# Patient Record
Sex: Male | Born: 1950
Health system: Southern US, Community
[De-identification: ages and names within clinical notes are randomized; demographics above are authoritative.]

## PROBLEM LIST (undated history)

## (undated) DIAGNOSIS — R0602 Shortness of breath: Secondary | ICD-10-CM

## (undated) DIAGNOSIS — Z9889 Other specified postprocedural states: Secondary | ICD-10-CM

## (undated) DIAGNOSIS — J439 Emphysema, unspecified: Secondary | ICD-10-CM

## (undated) DIAGNOSIS — I10 Essential (primary) hypertension: Secondary | ICD-10-CM

## (undated) DIAGNOSIS — Z87442 Personal history of urinary calculi: Secondary | ICD-10-CM

## (undated) DIAGNOSIS — G709 Myoneural disorder, unspecified: Secondary | ICD-10-CM

## (undated) DIAGNOSIS — Q211 Atrial septal defect, unspecified: Secondary | ICD-10-CM

## (undated) DIAGNOSIS — G4700 Insomnia, unspecified: Secondary | ICD-10-CM

## (undated) DIAGNOSIS — R112 Nausea with vomiting, unspecified: Secondary | ICD-10-CM

## (undated) DIAGNOSIS — N429 Disorder of prostate, unspecified: Secondary | ICD-10-CM

## (undated) DIAGNOSIS — R51 Headache: Secondary | ICD-10-CM

## (undated) DIAGNOSIS — Q2112 Patent foramen ovale: Secondary | ICD-10-CM

## (undated) DIAGNOSIS — K219 Gastro-esophageal reflux disease without esophagitis: Secondary | ICD-10-CM

## (undated) HISTORY — PX: INNER EAR SURGERY: SHX679

## (undated) HISTORY — DX: Patent foramen ovale: Q21.12

## (undated) HISTORY — DX: Emphysema, unspecified: J43.9

## (undated) HISTORY — PX: KNEE SURGERY: SHX244

## (undated) HISTORY — PX: WRIST SURGERY: SHX841

## (undated) HISTORY — PX: ELBOW SURGERY: SHX618

## (undated) HISTORY — DX: Atrial septal defect, unspecified: Q21.10

## (undated) HISTORY — PX: ROTATOR CUFF REPAIR: SHX139

## (undated) HISTORY — PX: CYSTECTOMY: SUR359

## (undated) HISTORY — PX: MASTOIDECTOMY: SHX711

## (undated) HISTORY — DX: Insomnia, unspecified: G47.00

## (undated) HISTORY — PX: EYE SURGERY: SHX253

## (undated) HISTORY — DX: Disorder of prostate, unspecified: N42.9

## (undated) HISTORY — DX: Essential (primary) hypertension: I10

## (undated) HISTORY — PX: NECK SURGERY: SHX720

---

## 1997-10-13 ENCOUNTER — Ambulatory Visit (HOSPITAL_COMMUNITY): Admission: RE | Admit: 1997-10-13 | Discharge: 1997-10-13 | Payer: Self-pay | Admitting: Family Medicine

## 1998-06-11 ENCOUNTER — Ambulatory Visit (HOSPITAL_BASED_OUTPATIENT_CLINIC_OR_DEPARTMENT_OTHER): Admission: RE | Admit: 1998-06-11 | Discharge: 1998-06-11 | Payer: Self-pay | Admitting: Orthopaedic Surgery

## 2000-03-09 ENCOUNTER — Encounter: Payer: Self-pay | Admitting: Neurological Surgery

## 2000-03-09 ENCOUNTER — Encounter: Admission: RE | Admit: 2000-03-09 | Discharge: 2000-03-09 | Payer: Self-pay | Admitting: Neurological Surgery

## 2000-03-17 ENCOUNTER — Encounter: Admission: RE | Admit: 2000-03-17 | Discharge: 2000-03-17 | Payer: Self-pay | Admitting: Neurological Surgery

## 2000-03-17 ENCOUNTER — Encounter: Payer: Self-pay | Admitting: Neurological Surgery

## 2000-08-01 ENCOUNTER — Encounter: Payer: Self-pay | Admitting: Neurosurgery

## 2000-08-01 ENCOUNTER — Ambulatory Visit (HOSPITAL_COMMUNITY): Admission: RE | Admit: 2000-08-01 | Discharge: 2000-08-01 | Payer: Self-pay | Admitting: Neurosurgery

## 2001-01-29 ENCOUNTER — Ambulatory Visit (HOSPITAL_COMMUNITY): Admission: RE | Admit: 2001-01-29 | Discharge: 2001-01-29 | Payer: Self-pay | Admitting: General Surgery

## 2001-01-29 ENCOUNTER — Other Ambulatory Visit: Admission: RE | Admit: 2001-01-29 | Discharge: 2001-01-29 | Payer: Self-pay | Admitting: General Surgery

## 2001-01-29 ENCOUNTER — Encounter: Payer: Self-pay | Admitting: General Surgery

## 2001-12-12 ENCOUNTER — Encounter: Admission: RE | Admit: 2001-12-12 | Discharge: 2001-12-12 | Payer: Self-pay | Admitting: *Deleted

## 2001-12-12 ENCOUNTER — Encounter: Payer: Self-pay | Admitting: *Deleted

## 2001-12-13 ENCOUNTER — Ambulatory Visit (HOSPITAL_BASED_OUTPATIENT_CLINIC_OR_DEPARTMENT_OTHER): Admission: RE | Admit: 2001-12-13 | Discharge: 2001-12-13 | Payer: Self-pay | Admitting: *Deleted

## 2003-11-21 ENCOUNTER — Inpatient Hospital Stay (HOSPITAL_COMMUNITY): Admission: RE | Admit: 2003-11-21 | Discharge: 2003-11-22 | Payer: Self-pay | Admitting: Orthopaedic Surgery

## 2004-03-05 ENCOUNTER — Ambulatory Visit (HOSPITAL_COMMUNITY): Admission: RE | Admit: 2004-03-05 | Discharge: 2004-03-05 | Payer: Self-pay | Admitting: Family Medicine

## 2005-12-22 ENCOUNTER — Ambulatory Visit (HOSPITAL_COMMUNITY): Admission: RE | Admit: 2005-12-22 | Discharge: 2005-12-22 | Payer: Self-pay | Admitting: *Deleted

## 2006-06-15 ENCOUNTER — Ambulatory Visit (HOSPITAL_COMMUNITY): Admission: RE | Admit: 2006-06-15 | Discharge: 2006-06-15 | Payer: Self-pay | Admitting: Family Medicine

## 2006-06-16 ENCOUNTER — Ambulatory Visit (HOSPITAL_COMMUNITY): Admission: RE | Admit: 2006-06-16 | Discharge: 2006-06-16 | Payer: Self-pay | Admitting: Family Medicine

## 2006-07-18 ENCOUNTER — Ambulatory Visit (HOSPITAL_COMMUNITY): Admission: RE | Admit: 2006-07-18 | Discharge: 2006-07-18 | Payer: Self-pay | Admitting: General Surgery

## 2006-07-19 ENCOUNTER — Emergency Department (HOSPITAL_COMMUNITY): Admission: EM | Admit: 2006-07-19 | Discharge: 2006-07-19 | Payer: Self-pay | Admitting: Emergency Medicine

## 2006-11-01 ENCOUNTER — Ambulatory Visit (HOSPITAL_COMMUNITY): Admission: RE | Admit: 2006-11-01 | Discharge: 2006-11-01 | Payer: Self-pay | Admitting: Family Medicine

## 2007-03-14 ENCOUNTER — Ambulatory Visit (HOSPITAL_COMMUNITY): Admission: RE | Admit: 2007-03-14 | Discharge: 2007-03-14 | Payer: Self-pay | Admitting: Family Medicine

## 2007-04-10 ENCOUNTER — Inpatient Hospital Stay (HOSPITAL_COMMUNITY): Admission: RE | Admit: 2007-04-10 | Discharge: 2007-04-11 | Payer: Self-pay | Admitting: Orthopaedic Surgery

## 2008-03-28 ENCOUNTER — Ambulatory Visit (HOSPITAL_COMMUNITY): Admission: RE | Admit: 2008-03-28 | Discharge: 2008-03-28 | Payer: Self-pay | Admitting: Internal Medicine

## 2008-12-08 ENCOUNTER — Ambulatory Visit (HOSPITAL_COMMUNITY): Admission: RE | Admit: 2008-12-08 | Discharge: 2008-12-08 | Payer: Self-pay | Admitting: Family Medicine

## 2009-06-06 HISTORY — PX: ESOPHAGOGASTRODUODENOSCOPY: SHX1529

## 2009-12-11 ENCOUNTER — Ambulatory Visit (HOSPITAL_COMMUNITY): Admission: RE | Admit: 2009-12-11 | Discharge: 2009-12-11 | Payer: Self-pay | Admitting: Family Medicine

## 2010-01-05 ENCOUNTER — Ambulatory Visit: Payer: Self-pay | Admitting: Internal Medicine

## 2010-01-05 DIAGNOSIS — R1319 Other dysphagia: Secondary | ICD-10-CM | POA: Insufficient documentation

## 2010-01-05 DIAGNOSIS — K219 Gastro-esophageal reflux disease without esophagitis: Secondary | ICD-10-CM | POA: Insufficient documentation

## 2010-01-06 ENCOUNTER — Encounter: Payer: Self-pay | Admitting: Internal Medicine

## 2010-01-19 ENCOUNTER — Ambulatory Visit: Payer: Self-pay | Admitting: Internal Medicine

## 2010-01-19 ENCOUNTER — Ambulatory Visit (HOSPITAL_COMMUNITY): Admission: RE | Admit: 2010-01-19 | Discharge: 2010-01-19 | Payer: Self-pay | Admitting: Internal Medicine

## 2010-07-08 ENCOUNTER — Encounter (INDEPENDENT_AMBULATORY_CARE_PROVIDER_SITE_OTHER): Payer: Self-pay | Admitting: *Deleted

## 2010-07-08 NOTE — Assessment & Plan Note (Signed)
Summary: gerd,acid reflux,dysphagia/ss   Visit Type:  Initial Consult Referring Provider:  Phillips Odor Primary Care Provider:  Phillips Odor  Chief Complaint:  Reflux/dysphagia.  History of Present Illness: Pleasant 60 year old gentleman referred by Dr. Phillips Odor and associates for further evaluation of long-standing intermittent chest and neck pain which comes on spontaneously not associate with eating. He does have a history of typical reflux symptoms which have been recently abolished with omeprazole 40 mg orally twice daily. In addition, he notes intermittent esophageal dysphagia to bread and meats. He has had a thorough cardiac evaluation -  has been told by his cardiologist that his heart is doing very well is not source of his symptoms. Of note, he has taken a variety of over-the-counter acid suppressing agents on a p.r.n. basis until 3 weeks ago when .  He was started on omeprazole 40 mg orally twice daily; shortly thereafter episodes of chest and jaw pain has subsided; he continues to have intermittent dysphagia. He has never had his upper GI tract evaluated. He tells me Dr. Lovell Sheehan  performed screening colonoscopy a few years ago and everything checked out okay.  No melena or rectal bleeding. and   Current Medications (verified): 1)  Proventil Hfa 108 (90 Base) Mcg/act Aers (Albuterol Sulfate) .... As Directed 2)  Gabapentin 300 Mg Caps (Gabapentin) .... Take Two Tablets Three Times Daily 3)  Wellbutrin Xl 300 Mg Xr24h-Tab (Bupropion Hcl) .... Take 1 Tablet By Mouth Once A Day 4)  Omeprazole 40 Mg Cpdr (Omeprazole) .... Take 1 Tablet By Mouth Two Times A Day 5)  Metoprolol Tartrate 100 Mg Tabs (Metoprolol Tartrate) .... Take 1 Tablet By Mouth Two Times A Day 6)  Ambien 10 Mg Tabs (Zolpidem Tartrate) .... Take 1 Tab By Mouth At Bedtime 7)  Amitriptyline Hcl 100 Mg Tabs (Amitriptyline Hcl) .... One Tablet At Bedtime 8)  Flomax 0.4 Mg Caps (Tamsulosin Hcl) .... Two Tablets At Bedtime 9)   Hydrocodone-Acetaminophen 7.5-650 Mg Tabs (Hydrocodone-Acetaminophen) .... As Needed 10)  Nitro-Dur 0.4 Mg/hr Pt24 (Nitroglycerin) .... As Needed  Allergies (verified): No Known Drug Allergies  Past History:  Family History: Last updated: Jan 22, 2010 Father: Deceased age 33   Aneurysm in brain Mother: Living age 22   healthy/dementia Siblings: Two sisters    healthy  Social History: Last updated: 01/22/2010 Marital Status: Married Children: two     healthy Occupation: Retired  Charity fundraiser  Past Medical History: Chronic Asthma Mild Emphysema Anxiety Depression Insomnia Prostate problems Hypertension Neuropathy  Past Surgical History: Three neck surgeries Two surgeries on each elbow Tow surgeries on each knee Left wrist surgery Two surgeries on left ear Mastoidectomy (left)  Family History: Father: Deceased age 69   Aneurysm in brain Mother: Living age 74   healthy/dementia Siblings: Two sisters    healthy  Social History: Marital Status: Married Children: two     healthy Occupation: Retired  Charity fundraiser  Physical Exam  General:  alert conversant pleasant gentleman resting comfortably Eyes:  no scleral icterus. Lungs:  clear to auscultation Heart:  regular rhythm without murmur gallop rub Abdomen:  nondistended positive bowel sounds soft nontender without appreciable mass or organomegaly  Impression & Recommendations: Impression: Atypical chest and neck pain with some associated typical reflux symptoms and  esophageal dysphagia. It's good to know that his cardiac exam came back negative. Since being on high-dose acid suppressing therapy, his chest and neck symptoms have subsided. His typical GERD symptoms have also subsided. I suspect that we are essentially dealing  with an atypical manifestation of gastroesophageal reflux disease. He has esophageal dysphagia to solids which warrants further evaluation.  Recommendations: Diagnostic  EGD with potential for esophageal dilation in the very near future at the hospital. Risks, benefits, limitations, imponderables and alternatives have been reviewed. Questions have been answered. He is agreeable. We'll make further recommendations once endoscopic evaluation has been carried out.  I thank Dr. Phillips Odor for his referral.  Appended Document: Orders Update    Clinical Lists Changes  Problems: Added new problem of GERD (ICD-530.81) Added new problem of OTHER DYSPHAGIA (ICD-787.29) Orders: Added new Service order of New Patient Level IV (16109) - Signed

## 2010-07-08 NOTE — Letter (Signed)
Summary: REFERRAL FROM DR Assunta Found  REFERRAL FROM DR Assunta Found   Imported By: Rexene Alberts 01/06/2010 12:04:04  _____________________________________________________________________  External Attachment:    Type:   Image     Comment:   External Document

## 2010-07-08 NOTE — Letter (Signed)
Summary: EGD/ED order  EGD/ED order   Imported By: Minna Merritts 01/05/2010 16:13:49  _____________________________________________________________________  External Attachment:    Type:   Image     Comment:   External Document

## 2010-07-14 NOTE — Letter (Signed)
Summary: Recall Office Visit  [facility]  [address]   [place], [place] [address]   Phone: [phone]  Fax: [phone]      July 08, 2010   Terry Morrow [address] [place], [place]  [address] 03/12/1951   Dear [name],   According to our records, it is time for you to schedule a follow-up office visit with [place].   At your convenience, please call [phone] to schedule an office visit. If you have any questions, concerns, or feel that this letter is in error, we would appreciate your call.   Sincerely,    Diana Eves  [facility] Ph: [phone]   Fax: [phone]

## 2010-07-21 ENCOUNTER — Ambulatory Visit (HOSPITAL_COMMUNITY)
Admission: RE | Admit: 2010-07-21 | Discharge: 2010-07-21 | Disposition: A | Discharge status: Home / Self Care | Payer: Medicare Other | Source: Ambulatory Visit | Attending: Family Medicine | Admitting: Family Medicine

## 2010-07-21 ENCOUNTER — Other Ambulatory Visit (HOSPITAL_COMMUNITY): Payer: Self-pay | Admitting: Family Medicine

## 2010-07-21 ENCOUNTER — Encounter (HOSPITAL_COMMUNITY): Payer: Self-pay

## 2010-07-21 DIAGNOSIS — R0602 Shortness of breath: Secondary | ICD-10-CM

## 2010-07-21 DIAGNOSIS — R059 Cough, unspecified: Secondary | ICD-10-CM

## 2010-07-21 DIAGNOSIS — J449 Chronic obstructive pulmonary disease, unspecified: Secondary | ICD-10-CM | POA: Insufficient documentation

## 2010-07-21 DIAGNOSIS — R05 Cough: Secondary | ICD-10-CM

## 2010-07-21 DIAGNOSIS — J4489 Other specified chronic obstructive pulmonary disease: Secondary | ICD-10-CM | POA: Insufficient documentation

## 2010-07-21 DIAGNOSIS — F17200 Nicotine dependence, unspecified, uncomplicated: Secondary | ICD-10-CM

## 2010-08-12 ENCOUNTER — Ambulatory Visit (INDEPENDENT_AMBULATORY_CARE_PROVIDER_SITE_OTHER): Payer: Medicare Other | Admitting: Gastroenterology

## 2010-08-12 ENCOUNTER — Encounter: Payer: Self-pay | Admitting: Gastroenterology

## 2010-08-12 DIAGNOSIS — K219 Gastro-esophageal reflux disease without esophagitis: Secondary | ICD-10-CM

## 2010-08-12 DIAGNOSIS — R1319 Other dysphagia: Secondary | ICD-10-CM

## 2010-08-17 NOTE — Assessment & Plan Note (Signed)
Summary: PP FU IN 6 MONTHS/GERD,ACID REFLUX,DYSPHAGIA/law   Vital Signs:  Patient profile:   60 year old male Height:      71 inches Weight:      174 pounds BMI:     24.36 Temp:     98.7 degrees F oral Pulse rate:   84 / minute BP sitting:   126 / 78  (left arm)  Vitals Entered By: Carolan Clines LPN (August 11, 2128 1:37 PM)  Visit Type:  Follow-up Visit Primary Care Provider:  Phillips Odor   History of Present Illness: Here for f/u of GERD, dysphagia. EGD August 2011 with nl esophagus, s/p dilation. Doing well without dysphagia/odynophagia. Reflux controlled on twice daily Omeprazole. No loss of appetite. No wt change. No abdominal pain. Doing well.   Current Medications (verified): 1)  Proventil Hfa 108 (90 Base) Mcg/act Aers (Albuterol Sulfate) .... As Directed 2)  Gabapentin 300 Mg Caps (Gabapentin) .... Take Two Tablets Three Times Daily 3)  Wellbutrin Xl 300 Mg Xr24h-Tab (Bupropion Hcl) .... Take 1 Tablet By Mouth Once A Day 4)  Omeprazole 40 Mg Cpdr (Omeprazole) .... Take 1 Tablet By Mouth Two Times A Day 5)  Metoprolol Tartrate 100 Mg Tabs (Metoprolol Tartrate) .... Take 1 Tablet By Mouth Two Times A Day 6)  Ambien 10 Mg Tabs (Zolpidem Tartrate) .... Take 1 Tab By Mouth At Bedtime 7)  Amitriptyline Hcl 100 Mg Tabs (Amitriptyline Hcl) .... One Tablet At Bedtime 8)  Flomax 0.4 Mg Caps (Tamsulosin Hcl) .... Two Tablets At Bedtime 9)  Hydrocodone-Acetaminophen 7.5-650 Mg Tabs (Hydrocodone-Acetaminophen) .... As Needed 10)  Nitro-Dur 0.4 Mg/hr Pt24 (Nitroglycerin) .... As Needed 11)  Albuterol Sulfate (2.5 Mg/61ml) 0.083% Nebu (Albuterol Sulfate) .... Use One Vial Q4-6hrs Prn 12)  Ibuprofen 200 Mg Tabs (Ibuprofen) .... Take Three Tabs As Needed  Allergies (verified): No Known Drug Allergies  Past History:  Past Medical History: Last updated: 01/05/2010 Chronic Asthma Mild Emphysema Anxiety Depression Insomnia Prostate problems Hypertension Neuropathy  Review of  Systems General:  Denies fever, chills, and anorexia. Eyes:  Denies blurring, irritation, and discharge. ENT:  Denies sore throat, hoarseness, and difficulty swallowing. CV:  Denies chest pains and syncope. Resp:  Denies dyspnea at rest and wheezing. GI:  See HPI. GU:  Denies urinary burning and urinary frequency. MS:  Denies joint pain / LOM, joint swelling, and joint stiffness. Derm:  Denies rash, itching, and dry skin. Neuro:  Denies weakness and syncope. Psych:  Denies depression and anxiety. Endo:  Denies cold intolerance and heat intolerance.  Physical Exam  General:  Well developed, well nourished, no acute distress. Head:  Normocephalic and atraumatic. Lungs:  Clear throughout to auscultation. Heart:  Regular rate and rhythm; no murmurs, rubs,  or bruits. Abdomen:  +BS, soft, non-tender, non-distended. no HSM, no rebound or guarding.  Skin:  Intact without significant lesions or rashes. Psych:  Alert and cooperative. Normal mood and affect.   Impression & Recommendations:  Problem # 1:  GERD (ICD-21.33)  60 year old male with hx of GERD, responded well to twice daily Prilosec 40 mg. Recent EGD Aug 11 reassurring. No dysphagia symptoms currently, s/p dilatation.    Continue Prilosec 40 mg by mouth twice/day Return in 1 year or sooner as needed  Orders: Est. Patient Level II (86578)  Problem # 2:  OTHER DYSPHAGIA (ICD-787.29)  Improved. See # 1  Orders: Est. Patient Level II (46962) Prescriptions: OMEPRAZOLE 40 MG CPDR (OMEPRAZOLE) Take 1 tablet by mouth two times a day  #  61 x 11   Entered and Authorized by:   Gerrit Halls NP   Signed by:   Gerrit Halls NP on 08/12/2010   Method used:   Faxed to ...       [facility]  [address] [phone]* (retail)       [address]       [place], [place]  [address]       Ph: [phone]       Fax: [phone]   RxID:   [id number]    Orders Added: 1)  Est. Patient Level II [27253]  Appended Document: PP FU  IN 6 MONTHS/GERD,ACID REFLUX,DYSPHAGIA/law 89YR F/U OPV IS IN THE COMPUTER

## 2010-08-20 ENCOUNTER — Ambulatory Visit (HOSPITAL_COMMUNITY)
Admission: RE | Admit: 2010-08-20 | Discharge: 2010-08-20 | Disposition: A | Discharge status: Home / Self Care | Payer: Medicare Other | Source: Ambulatory Visit | Attending: Family Medicine | Admitting: Family Medicine

## 2010-08-20 ENCOUNTER — Ambulatory Visit (HOSPITAL_COMMUNITY): Payer: Medicare Other

## 2010-08-20 ENCOUNTER — Other Ambulatory Visit (HOSPITAL_COMMUNITY): Payer: Self-pay | Admitting: Family Medicine

## 2010-08-20 DIAGNOSIS — M25569 Pain in unspecified knee: Secondary | ICD-10-CM | POA: Insufficient documentation

## 2010-08-20 DIAGNOSIS — W11XXXA Fall on and from ladder, initial encounter: Secondary | ICD-10-CM

## 2010-08-20 DIAGNOSIS — M25469 Effusion, unspecified knee: Secondary | ICD-10-CM | POA: Insufficient documentation

## 2010-08-20 DIAGNOSIS — M79604 Pain in right leg: Secondary | ICD-10-CM

## 2010-10-13 ENCOUNTER — Other Ambulatory Visit (HOSPITAL_COMMUNITY): Payer: Self-pay | Admitting: Family Medicine

## 2010-10-13 DIAGNOSIS — R51 Headache: Secondary | ICD-10-CM

## 2010-10-14 ENCOUNTER — Ambulatory Visit (HOSPITAL_COMMUNITY)
Admission: RE | Admit: 2010-10-14 | Discharge: 2010-10-14 | Disposition: A | Discharge status: Home / Self Care | Payer: Medicare Other | Source: Ambulatory Visit | Attending: Family Medicine | Admitting: Family Medicine

## 2010-10-14 DIAGNOSIS — R51 Headache: Secondary | ICD-10-CM | POA: Insufficient documentation

## 2010-10-19 NOTE — Op Note (Signed)
NAME:  Terry Morrow, Terry Morrow               ACCOUNT NO.:  [id number]   MEDICAL RECORD NO.:  [id number]          PATIENT TYPE:  OIB   LOCATION:  5032                         FACILITY:  [facility]   PHYSICIAN:  Mark C. Ophelia Charter, M.D.    DATE OF BIRTH:  06/20/1950   DATE OF PROCEDURE:  04/09/2007  DATE OF DISCHARGE:                               OPERATIVE REPORT   PREOPERATIVE DIAGNOSIS:  C4-5 pseudoarthrosis.   POSTOPERATIVE DIAGNOSIS:  C4-5 pseudoarthrosis.   PROCEDURE:  C4-C5 posterior cervical fusion with wiring.  Iliac crest  aspirate and Vitoss.   SURGEON:  Annell Greening, MD   ASSISTANT:  Maud Deed, PA-C.   ANESTHESIA:  General orotracheal.   ESTIMATED BLOOD LOSS:  200 mL.   PROCEDURE:  Induction of general anesthesia.  The patient placed on a  horseshoe head holder in the prone position.  Careful padding and  positioning, arms tucked.  Tape was applied  to the skin and care was  taken to make sure there is no pressure on the eyes.  Neck was prepped  with DuraPrep as well as the right posterior iliac crest, the area  squared with towels, sterile skin marker on the neck and Betadine Vi-  Drape application.  Sterile Mayo stand head, thyroid sheet.  Incision  was made at the midline and time-out procedure was confirmed.  Two  Kocher clamps were placed between the two expected levels, covered and  the Kocher clamps were placed at the spinous process of C2-3 and C3-4  and planned levels C4-5.  The C4-5 levels then exposed in subperiosteal  dissection on the lamina.  There was several venous bleeders which were  controlled with Bovie cautery.  At one point there was some bleeding and  wound was packed off  and the right posterior iliac crest was aspirated,  moving the needle back and forward the patient get a total of 10 mL of  blood which was placed on the Vitoss 10 mL strip.  After hemostasis was  obtained, triple strand 24-gauge stainless steel wire was twisted into a  cable, passed  underneath C5, around the top of C4 and tightened down  securely.  Cross-table lateral x-ray showed that the wire was in good  position.  After confirmation, this appropriate level of the lamina was  burred up with power bur.  Vitoss was cut in half and packed on the  lamina of C4-5 on each side and wound was then closed with 0 Vicryl in  the deep fascia, 2-0 Vicryl subcutaneous tissue, subcuticular skin  closure,  postop dressing, soft cervical collar.  Instrument count and  needle count correct.      Mark C. Ophelia Charter, M.D.  Electronically Signed     MCY/MEDQ  D:  04/09/2007  T:  04/10/2007  Job:  [id number]

## 2010-10-22 NOTE — Op Note (Signed)
NAME:  Terry Morrow, Terry Morrow                         ACCOUNT NO.:  [id number]   MEDICAL RECORD NO.:  [id number]                   PATIENT TYPE:  INP   LOCATION:  2873                                 FACILITY:  [facility]   PHYSICIAN:  Mark C. Ophelia Charter, M.D.                 DATE OF BIRTH:  11-27-1950   DATE OF PROCEDURE:  11/21/2003  DATE OF DISCHARGE:                                 OPERATIVE REPORT   PREOPERATIVE DIAGNOSES:  1. Cervical spondylosis, C3-4, C4-5.  2. Previous C5-6 fusion.   POSTOPERATIVE DIAGNOSES:  1. Cervical spondylosis, C3-4, C4-5.  2. Previous C5-6 fusion.   PROCEDURE:  C3-4, C4-5 anterior cervical diskectomy and fusion with plating,  with right iliac crest bone graft.   SURGEON:  Mark C. Ophelia Charter, M.D.   ASSISTANT:  Sandrea Matte, P.A.   ANESTHESIA:  GOT.   ESTIMATED BLOOD LOSS:  100 mL.   DRAINS:  One Hemovac in the neck.   PROCEDURE:  After induction of general anesthesia, orotracheal intubation,  head Holter traction, application of gel bag behind the right iliac crest, a  sand bag behind the neck, the neck and the right iliac crest were prepped  with Duraprep.  The patient had previous bone graft obtained from the left  hip for his previous surgery, so the right iliac crest was selected for  harvest at this procedure.  The area was squared with towels after Duraprep  and preoperative Ancef was given prophylactically.  A half sheet in between,  Betadine Vi-Drape application after skin marker.  A sterile Mayo stand at  the head, thyroid sheet.  Incision was made starting in the midline and  extending to the left, parallel but proximal or cephalad to the previous  incision.  Platysma was divided in line with the fibers, blunt dissection  down the longus colli with carotid sheath and contents lateral.  A spur at  C4-5 was identified, crosstable C-arm that was draped was used to confirm  this.  Diskectomy was performed.  Sequential drilling with the Cloward drill  back to the posterior cortex.  Operating microscope was brought in, the  posterior longitudinal ligament was taken down, spurs were removed, dura was  directly visualized.  All degenerative disk material was removed.  There was  some overhanging of the vertebral body above at the disk space requiring  microdissection and a 1 mm Kerrison in order to remove the overhanging spur.  Once the dura was completely decompressed, a 12 mm plug was harvested from  the right iliac crest, splitting the fascia in line with the fibers,  trimming it based on depth gauge measurement, bullet nose and placement.  After irrigation, the self-retaining retractor was removed to the superior  level, identical procedure was repeated.  At this level there was  significant stenosis from large overhanging spurs.  Both gutters were  carefully curetted with Kerrison initially and then Cloward curettes,  going  up to the 0 size.  After harvesting a second bone graft and trimming it, a  second bone graft was placed and impacted.  After confirmation of good  position of both plugs with fluoroscopy, a 40 mm Synthes gold plate was  selected, holes were drilled, 14 mm screws were placed.  AP and lateral x-  ray were used to confirm the appropriate position.  After repeat irrigation,  a Hemovac was placed starting inside and extending out toward the left side  in line with the skin incision, 3-0 in the platysma, 4-0 Vicryl subcuticular  closure, tincture of Benzoin and Steri-Strips.  Iliac crest closed with 0  undyed Vicryl, 2-0 on the subcutaneous tissue, skin staple closure, postop  dressing, and soft cervical collar.  Instrument count and needle count was  correct.                                               Mark C. Ophelia Charter, M.D.    MCY/MEDQ  D:  11/21/2003  T:  11/24/2003  Job:  [id number]

## 2010-10-22 NOTE — H&P (Signed)
NAME:  Terry Morrow, Terry Morrow               ACCOUNT NO.:  [id number]   MEDICAL RECORD NO.:  [id number]          PATIENT TYPE:  AMB   LOCATION:  DAY                           FACILITY:  [facility]   PHYSICIAN:  Dalia Heading, M.D.  DATE OF BIRTH:  17-Jun-1950   DATE OF ADMISSION:  DATE OF DISCHARGE:  LH                              HISTORY & PHYSICAL   CHIEF COMPLAINT:  Need for screening colonoscopy.   HISTORY OF PRESENT ILLNESS:  The patient is a 60 year old white male who  is referred for endoscopic evaluation.  He needs colonoscopy for  screening purposes.  No abdominal pain, weight loss, nausea, vomiting,  diarrhea, melena, or hematochezia have been noted.  He does have some  constipation.  He has never had a colonoscopy.  There is no family  history of colon carcinoma.   PAST MEDICAL HISTORY:  Unremarkable.   PAST SURGICAL HISTORY:  Multiple extremity surgeries, neck surgery.   CURRENT MEDICATIONS:  Omeprazole, Flomax, amitriptyline, Wellbutrin,  metoprolol, Oxyfenesin, Percocet, baby aspirin, cyclobenzaprine.   ALLERGIES:  No known drug allergies.   REVIEW OF SYSTEMS:  The patient smokes 3/4 pack of cigarettes a day.  Denies any significant alcohol use.   PHYSICAL EXAMINATION:  GENERAL:  The patient is a well developed, well  nourished white male in no acute distress.  LUNGS:  Clear to auscultation with equal breath sounds bilaterally.  HEART: Examination reveals regular rate and rhythm without S3, S4, or  murmurs.  ABDOMEN:  Soft, nontender, nondistended.  No hepatosplenomegaly or  masses noted.  RECTAL:  Examination was deferred to the procedure.   IMPRESSION:  Need for screening colonoscopy.   PLAN:  The patient is scheduled for a colonoscopy on July 18, 2006.  The risks and benefits of the procedure including bleeding and  perforation were fully explained to the patient who gave informed  consent.      Dalia Heading, M.D.  Electronically Signed     MAJ/MEDQ   D:  07/06/2006  T:  07/06/2006  Job:  [id number]   cc:   Patrica Duel, M.D.  Fax: [phone]

## 2010-10-22 NOTE — Op Note (Signed)
[place]  Patient:    Terry Morrow, Terry Morrow Visit Number: [id number] MRN: [id number]          Service Type: DSU Location: [facility] Attending Physician:  Maryanna Shape Dictated by:   Reynolds Bowl, M.D. Admit Date:  12/13/2001 Discharge Date: 12/13/2001                             Operative Report  PREOPERATIVE DIAGNOSES: 1. Left knee tear of medial meniscus with locking. 2. Early osteoarthritis.  POSTOPERATIVE DIAGNOSES: 1. Displacing posterior third medial meniscus through degenerative tear. 2. Grade 2 or 3 chondromalacia medial femoral condyle and tibial plateau. 3. Incidental plica.  ANESTHESIA:  General.  SURGEON:  Reynolds Bowl, M.D.  DESCRIPTION OF PROCEDURE:  The patient was given a general anesthetic by endotracheal tube.  The left lower extremity had a tourniquet placed proximally, and he was prepped from the tips of the toes to the tourniquet area with DuraPrep.  The standard drape was applied.  The leg was exsanguinated with an Esmarch bandage and the proximal pneumatic tourniquet elevated to 350 mmHg.  I established an anterolateral portal for viewing then, under direct vision, anteromedial portal, and then with the fluid pressure set on 75 cc exam of the knee disclosed a field of tiny bits of white material washed out.  The patella had grade 2 changes with villi hanging down passing medially.  There was a moderate plica that did not appear inflamed, but it was prominent.  On the medial joint space at least grade 2 chondromalacia changes of the tibial plateau in the medial femoral condyle, and more posteriorly some of the defect was deeper.  I passed a probe posteriorly and could hook into a tear and displace the posterior third or a little more of the medial meniscus, as seen in the photographs.  This was through a degenerative tear.  The anterior cruciate ligament appeared normal, and the lateral joint space articular  surfaces were almost entirely normal, and the free edges of the lateral meniscus were essentially pristene.  I then used rongeurs and rotary meniscotome to remove this posterior part of the medial meniscus up to stable tissue, formed a transition.  Having done that, noted that any slight rubbing of the articular cartilage caused the superficial layer to want to delaminate.  Having completed this, I spent a little more time looking for loose debris which was removed, and then I did release the incidental plica proximally. Having done that, the instruments were removed.  Then 15 cc of Marcaine with epinephrine were injected in the knee.  The portals were closed with 4-0 nylon.  Each portal was covered with Tegaderm, then 4 x 4s, ABD, Kerlix, and Ace wrap applied.  We then held the leg elevated with the tourniquet down for at least two minutes.  Instructions to the patient will be to use cold pack as tolerated, take all usual medications, do frequent quad sets and ankle pumps, ambulate full weightbearing with straight knee.  Basically be at house rest with the leg elevated.  Call with concerns.  See me in the office Monday morning, 8 a.m. Dictated by:   Reynolds Bowl, M.D. Attending Physician:  Maryanna Shape DD:  12/13/01 TD:  12/16/01 Job: [id number]

## 2011-03-16 LAB — DIFFERENTIAL
Basophils Absolute: 0.1
Basophils Relative: 1
Eosinophils Relative: 3
Monocytes Absolute: 0.8 — ABNORMAL HIGH
Neutro Abs: 4.9

## 2011-03-16 LAB — BASIC METABOLIC PANEL
BUN: 10
CO2: 28
Calcium: 9.5
GFR calc non Af Amer: >60
Glucose, Bld: 84
Sodium: 146 — ABNORMAL HIGH

## 2011-03-16 LAB — CBC
Hemoglobin: 15.5
MCHC: 34.1
Platelets: 355
RDW: 13.9

## 2011-03-16 LAB — URINALYSIS, ROUTINE W REFLEX MICROSCOPIC
Bilirubin Urine: NEGATIVE
Glucose, UA: NEGATIVE
Ketones, ur: NEGATIVE
Protein, ur: NEGATIVE
pH: 6

## 2011-03-16 LAB — APTT: aPTT: 29

## 2011-03-16 LAB — PROTIME-INR: Prothrombin Time: 12.1

## 2011-08-25 ENCOUNTER — Encounter: Payer: Self-pay | Admitting: Internal Medicine

## 2011-11-08 ENCOUNTER — Ambulatory Visit (INDEPENDENT_AMBULATORY_CARE_PROVIDER_SITE_OTHER): Payer: Medicare Other | Admitting: Urology

## 2011-11-08 DIAGNOSIS — N402 Nodular prostate without lower urinary tract symptoms: Secondary | ICD-10-CM

## 2012-01-03 ENCOUNTER — Other Ambulatory Visit: Payer: Self-pay | Admitting: Orthopaedic Surgery

## 2012-01-03 DIAGNOSIS — M542 Cervicalgia: Secondary | ICD-10-CM

## 2012-01-04 ENCOUNTER — Ambulatory Visit
Admission: RE | Admit: 2012-01-04 | Discharge: 2012-01-04 | Disposition: A | Discharge status: Home / Self Care | Payer: Medicare Other | Source: Ambulatory Visit | Attending: Orthopaedic Surgery | Admitting: Orthopaedic Surgery

## 2012-01-04 DIAGNOSIS — M542 Cervicalgia: Secondary | ICD-10-CM

## 2012-01-08 ENCOUNTER — Other Ambulatory Visit: Payer: Medicare Other

## 2012-02-02 ENCOUNTER — Encounter (HOSPITAL_COMMUNITY): Payer: Self-pay | Admitting: Pharmacy Technician

## 2012-02-02 ENCOUNTER — Other Ambulatory Visit: Payer: Self-pay | Admitting: Neurological Surgery

## 2012-02-08 ENCOUNTER — Ambulatory Visit (HOSPITAL_COMMUNITY)
Admission: RE | Admit: 2012-02-08 | Discharge: 2012-02-08 | Disposition: A | Discharge status: Home / Self Care | Payer: Medicare Other | Source: Ambulatory Visit | Attending: Neurological Surgery | Admitting: Neurological Surgery

## 2012-02-08 ENCOUNTER — Encounter (HOSPITAL_COMMUNITY): Payer: Self-pay

## 2012-02-08 ENCOUNTER — Encounter (HOSPITAL_COMMUNITY)
Admission: RE | Admit: 2012-02-08 | Discharge: 2012-02-08 | Disposition: A | Discharge status: Home / Self Care | Payer: Medicare Other | Source: Ambulatory Visit | Attending: Neurological Surgery | Admitting: Neurological Surgery

## 2012-02-08 DIAGNOSIS — Z01810 Encounter for preprocedural cardiovascular examination: Secondary | ICD-10-CM | POA: Insufficient documentation

## 2012-02-08 DIAGNOSIS — Z01812 Encounter for preprocedural laboratory examination: Secondary | ICD-10-CM | POA: Insufficient documentation

## 2012-02-08 DIAGNOSIS — E119 Type 2 diabetes mellitus without complications: Secondary | ICD-10-CM | POA: Insufficient documentation

## 2012-02-08 DIAGNOSIS — Z87891 Personal history of nicotine dependence: Secondary | ICD-10-CM | POA: Insufficient documentation

## 2012-02-08 DIAGNOSIS — Z01818 Encounter for other preprocedural examination: Secondary | ICD-10-CM | POA: Insufficient documentation

## 2012-02-08 DIAGNOSIS — I10 Essential (primary) hypertension: Secondary | ICD-10-CM | POA: Insufficient documentation

## 2012-02-08 HISTORY — DX: Headache: R51

## 2012-02-08 LAB — CBC
MCHC: 34.6 g/dL (ref 30.0–36.0)
Platelets: 304 10*3/uL (ref 150–400)
RDW: 13.1 % (ref 11.5–15.5)

## 2012-02-08 LAB — COMPREHENSIVE METABOLIC PANEL
ALT: 21 U/L (ref 0–53)
Alkaline Phosphatase: 96 U/L (ref 39–117)
CO2: 23 mEq/L (ref 19–32)
GFR calc Af Amer: 72 mL/min — ABNORMAL LOW (ref >90)
GFR calc non Af Amer: 62 mL/min — ABNORMAL LOW (ref >90)
Glucose, Bld: 86 mg/dL (ref 70–99)
Potassium: 3.7 mEq/L (ref 3.5–5.1)
Sodium: 140 mEq/L (ref 135–145)
Total Bilirubin: 0.3 mg/dL (ref 0.3–1.2)

## 2012-02-08 NOTE — Progress Notes (Signed)
1515 Wednesday....i CALLED SURGEON'S OFFICE REQUESTING THAT ORDERS BE SIGNED.  DA

## 2012-02-08 NOTE — Pre-Procedure Instructions (Signed)
[name]   02/08/2012   Your procedure is scheduled on:  Thursday, September 5th  Report to [facility] at 8:00 AM.  Call this number if you have problems the morning of surgery: [phone]   Remember:   Do not eat food or drink any liquids:After Midnight Wednesday.    Take these medicines the morning of surgery with A SIP OF WATER: Nebulizers & Inhalers,              Metoprolol, Prilosec, Lorcet Plus   Do not wear jewelry.  Do not wear lotions, powders, or cologne. You may NOT wear deodorant.             Men may shave face and neck.   Do not bring valuables to the hospital.  Contacts, dentures or bridgework may not be worn into surgery.   Leave suitcase in the car. After surgery it may be brought to your room.  For patients admitted to the hospital, checkout time is 11:00 AM the day of discharge.   Patients discharged the day of surgery will not be allowed to drive home.   Name and phone number of your driver:    Special Instructions: CHG Shower Use Special Wash: 1/2 bottle night before surgery and 1/2 bottle morning of surgery.   Please read over the following fact sheets that you were given: Pain Booklet, Coughing and Deep Breathing, MRSA Information and Surgical Site Infection Prevention

## 2012-02-09 ENCOUNTER — Encounter (HOSPITAL_COMMUNITY)
Admission: RE | Disposition: A | Discharge status: Home / Self Care | Payer: Self-pay | Source: Ambulatory Visit | Attending: Neurological Surgery

## 2012-02-09 ENCOUNTER — Encounter (HOSPITAL_COMMUNITY): Payer: Self-pay | Admitting: Anesthesiology

## 2012-02-09 ENCOUNTER — Inpatient Hospital Stay (HOSPITAL_COMMUNITY): Payer: Medicare Other

## 2012-02-09 ENCOUNTER — Encounter (HOSPITAL_COMMUNITY): Payer: Self-pay | Admitting: *Deleted

## 2012-02-09 ENCOUNTER — Inpatient Hospital Stay (HOSPITAL_COMMUNITY)
Admission: RE | Admit: 2012-02-09 | Discharge: 2012-02-09 | DRG: 491 | Disposition: A | Discharge status: Home / Self Care | Payer: Medicare Other | Source: Ambulatory Visit | Attending: Neurological Surgery | Admitting: Neurological Surgery

## 2012-02-09 ENCOUNTER — Inpatient Hospital Stay (HOSPITAL_COMMUNITY): Payer: Medicare Other | Admitting: Anesthesiology

## 2012-02-09 DIAGNOSIS — Z87891 Personal history of nicotine dependence: Secondary | ICD-10-CM

## 2012-02-09 DIAGNOSIS — F411 Generalized anxiety disorder: Secondary | ICD-10-CM | POA: Diagnosis present

## 2012-02-09 DIAGNOSIS — Z01812 Encounter for preprocedural laboratory examination: Secondary | ICD-10-CM

## 2012-02-09 DIAGNOSIS — Z79899 Other long term (current) drug therapy: Secondary | ICD-10-CM

## 2012-02-09 DIAGNOSIS — F3289 Other specified depressive episodes: Secondary | ICD-10-CM | POA: Diagnosis present

## 2012-02-09 DIAGNOSIS — Z981 Arthrodesis status: Secondary | ICD-10-CM

## 2012-02-09 DIAGNOSIS — K219 Gastro-esophageal reflux disease without esophagitis: Secondary | ICD-10-CM | POA: Diagnosis present

## 2012-02-09 DIAGNOSIS — M5020 Other cervical disc displacement, unspecified cervical region: Secondary | ICD-10-CM

## 2012-02-09 DIAGNOSIS — F329 Major depressive disorder, single episode, unspecified: Secondary | ICD-10-CM | POA: Diagnosis present

## 2012-02-09 DIAGNOSIS — J449 Chronic obstructive pulmonary disease, unspecified: Secondary | ICD-10-CM | POA: Diagnosis present

## 2012-02-09 DIAGNOSIS — I10 Essential (primary) hypertension: Secondary | ICD-10-CM | POA: Diagnosis present

## 2012-02-09 DIAGNOSIS — E119 Type 2 diabetes mellitus without complications: Secondary | ICD-10-CM | POA: Diagnosis present

## 2012-02-09 DIAGNOSIS — J4489 Other specified chronic obstructive pulmonary disease: Secondary | ICD-10-CM | POA: Diagnosis present

## 2012-02-09 DIAGNOSIS — Z01810 Encounter for preprocedural cardiovascular examination: Secondary | ICD-10-CM

## 2012-02-09 DIAGNOSIS — Z01818 Encounter for other preprocedural examination: Secondary | ICD-10-CM

## 2012-02-09 SURGERY — POSTERIOR CERVICAL LAMINECTOMY WITH MET- RX
Anesthesia: General | Laterality: Left | Wound class: Clean

## 2012-02-09 MED ORDER — ACETAMINOPHEN 325 MG PO TABS: 650.0000 mg | ORAL_TABLET | ORAL | Status: DC | PRN

## 2012-02-09 MED ORDER — PHENOL 1.4 % MT LIQD: 1.0000 | OROMUCOSAL | Status: DC | PRN

## 2012-02-09 MED ORDER — LIDOCAINE-EPINEPHRINE 1 %-1:100000 IJ SOLN
INTRAMUSCULAR | Status: DC | PRN
  Administered 2012-02-09: 4 mL

## 2012-02-09 MED ORDER — MORPHINE SULFATE 2 MG/ML IJ SOLN: 1.0000 mg | INTRAMUSCULAR | Status: DC | PRN

## 2012-02-09 MED ORDER — BACLOFEN 10 MG PO TABS
10.0000 mg | ORAL_TABLET | Freq: Every day | ORAL | Status: DC | PRN
  Filled 2012-02-09: qty 1

## 2012-02-09 MED ORDER — MIDAZOLAM HCL 5 MG/5ML IJ SOLN
INTRAMUSCULAR | Status: DC | PRN
  Administered 2012-02-09: 2 mg via INTRAVENOUS

## 2012-02-09 MED ORDER — ROCURONIUM BROMIDE 100 MG/10ML IV SOLN
INTRAVENOUS | Status: DC | PRN
  Administered 2012-02-09: 50 mg via INTRAVENOUS

## 2012-02-09 MED ORDER — LACTATED RINGERS IV SOLN
INTRAVENOUS | Status: DC | PRN
  Administered 2012-02-09 (×3): via INTRAVENOUS

## 2012-02-09 MED ORDER — NEOSTIGMINE METHYLSULFATE 1 MG/ML IJ SOLN
INTRAMUSCULAR | Status: DC | PRN
  Administered 2012-02-09: 4 mg via INTRAVENOUS

## 2012-02-09 MED ORDER — SODIUM CHLORIDE 0.9 % IR SOLN
Status: DC | PRN
  Administered 2012-02-09: 11:00:00

## 2012-02-09 MED ORDER — CEFAZOLIN SODIUM 1-5 GM-% IV SOLN
1.0000 g | Freq: Three times a day (TID) | INTRAVENOUS | Status: DC
  Filled 2012-02-09 (×2): qty 50

## 2012-02-09 MED ORDER — BUPIVACAINE HCL (PF) 0.5 % IJ SOLN
INTRAMUSCULAR | Status: DC | PRN
  Administered 2012-02-09: 4 mL

## 2012-02-09 MED ORDER — HYDROMORPHONE HCL PF 1 MG/ML IJ SOLN
0.2500 mg | INTRAMUSCULAR | Status: DC | PRN
  Administered 2012-02-09 (×4): 0.5 mg via INTRAVENOUS

## 2012-02-09 MED ORDER — PANTOPRAZOLE SODIUM 40 MG PO TBEC: 40.0000 mg | DELAYED_RELEASE_TABLET | Freq: Every day | ORAL | Status: DC

## 2012-02-09 MED ORDER — HYDROMORPHONE HCL PF 1 MG/ML IJ SOLN
INTRAMUSCULAR | Status: AC
  Filled 2012-02-09: qty 1

## 2012-02-09 MED ORDER — NITROGLYCERIN 0.4 MG SL SUBL: 0.4000 mg | SUBLINGUAL_TABLET | SUBLINGUAL | Status: DC | PRN

## 2012-02-09 MED ORDER — DEXAMETHASONE SODIUM PHOSPHATE 10 MG/ML IJ SOLN
INTRAMUSCULAR | Status: DC | PRN
  Administered 2012-02-09: 10 mg via INTRAVENOUS

## 2012-02-09 MED ORDER — SODIUM CHLORIDE 0.9 % IJ SOLN: 3.0000 mL | Freq: Two times a day (BID) | INTRAMUSCULAR | Status: DC

## 2012-02-09 MED ORDER — ALBUMIN HUMAN 5 % IV SOLN
INTRAVENOUS | Status: DC | PRN
  Administered 2012-02-09: 11:00:00 via INTRAVENOUS

## 2012-02-09 MED ORDER — HYDROCODONE-ACETAMINOPHEN 10-325 MG PO TABS: 1.0000 | ORAL_TABLET | Freq: Four times a day (QID) | ORAL | Status: DC | PRN

## 2012-02-09 MED ORDER — EPHEDRINE SULFATE 50 MG/ML IJ SOLN
INTRAMUSCULAR | Status: DC | PRN
  Administered 2012-02-09 (×12): 10 mg via INTRAVENOUS

## 2012-02-09 MED ORDER — KETOROLAC TROMETHAMINE 30 MG/ML IJ SOLN
INTRAMUSCULAR | Status: DC | PRN
  Administered 2012-02-09: 30 mg via INTRAVENOUS

## 2012-02-09 MED ORDER — SODIUM CHLORIDE 0.9 % IJ SOLN: 3.0000 mL | INTRAMUSCULAR | Status: DC | PRN

## 2012-02-09 MED ORDER — DIAZEPAM 5 MG PO TABS: 5.0000 mg | ORAL_TABLET | Freq: Four times a day (QID) | ORAL | Status: AC | PRN

## 2012-02-09 MED ORDER — KETOROLAC TROMETHAMINE 15 MG/ML IJ SOLN
15.0000 mg | Freq: Four times a day (QID) | INTRAMUSCULAR | Status: DC
  Administered 2012-02-09: 15 mg via INTRAVENOUS
  Filled 2012-02-09: qty 1

## 2012-02-09 MED ORDER — CEFAZOLIN SODIUM-DEXTROSE 2-3 GM-% IV SOLR
2.0000 g | INTRAVENOUS | Status: AC
  Administered 2012-02-09: 2 g via INTRAVENOUS

## 2012-02-09 MED ORDER — FENTANYL CITRATE 0.05 MG/ML IJ SOLN
INTRAMUSCULAR | Status: DC | PRN
  Administered 2012-02-09: 50 ug via INTRAVENOUS
  Administered 2012-02-09: 100 ug via INTRAVENOUS

## 2012-02-09 MED ORDER — ALBUTEROL SULFATE (5 MG/ML) 0.5% IN NEBU: 2.5000 mg | INHALATION_SOLUTION | RESPIRATORY_TRACT | Status: DC | PRN

## 2012-02-09 MED ORDER — GABAPENTIN 300 MG PO CAPS
300.0000 mg | ORAL_CAPSULE | Freq: Three times a day (TID) | ORAL | Status: DC
  Administered 2012-02-09: 300 mg via ORAL
  Filled 2012-02-09 (×2): qty 1

## 2012-02-09 MED ORDER — THROMBIN 5000 UNITS EX SOLR
CUTANEOUS | Status: DC | PRN
  Administered 2012-02-09 (×2): 5000 [IU] via TOPICAL

## 2012-02-09 MED ORDER — LIDOCAINE HCL (CARDIAC) 20 MG/ML IV SOLN
INTRAVENOUS | Status: DC | PRN
  Administered 2012-02-09: 60 mg via INTRAVENOUS

## 2012-02-09 MED ORDER — ALUM & MAG HYDROXIDE-SIMETH 200-200-20 MG/5ML PO SUSP: 30.0000 mL | Freq: Four times a day (QID) | ORAL | Status: DC | PRN

## 2012-02-09 MED ORDER — TOPIRAMATE 100 MG PO TABS
100.0000 mg | ORAL_TABLET | Freq: Every day | ORAL | Status: DC
Start: 2012-02-09 — End: 2012-02-09
  Filled 2012-02-09: qty 1

## 2012-02-09 MED ORDER — DIAZEPAM 5 MG PO TABS: 5.0000 mg | ORAL_TABLET | Freq: Four times a day (QID) | ORAL | Status: DC | PRN

## 2012-02-09 MED ORDER — PROPOFOL 10 MG/ML IV EMUL
INTRAVENOUS | Status: DC | PRN
  Administered 2012-02-09: 150 mg via INTRAVENOUS

## 2012-02-09 MED ORDER — MENTHOL 3 MG MT LOZG: 1.0000 | LOZENGE | OROMUCOSAL | Status: DC | PRN

## 2012-02-09 MED ORDER — SODIUM CHLORIDE 0.9 % IV SOLN
INTRAVENOUS | Status: AC
  Filled 2012-02-09: qty 500

## 2012-02-09 MED ORDER — DEXTROSE 5 % IV SOLN
10.0000 mg | INTRAVENOUS | Status: DC | PRN
  Administered 2012-02-09: 25 ug/min via INTRAVENOUS

## 2012-02-09 MED ORDER — CEFAZOLIN SODIUM-DEXTROSE 2-3 GM-% IV SOLR
INTRAVENOUS | Status: AC
  Filled 2012-02-09: qty 50

## 2012-02-09 MED ORDER — 0.9 % SODIUM CHLORIDE (POUR BTL) OPTIME
TOPICAL | Status: DC | PRN
  Administered 2012-02-09: 1000 mL

## 2012-02-09 MED ORDER — METOPROLOL SUCCINATE ER 100 MG PO TB24
100.0000 mg | ORAL_TABLET | Freq: Every day | ORAL | Status: DC
  Filled 2012-02-09: qty 1

## 2012-02-09 MED ORDER — GLYCOPYRROLATE 0.2 MG/ML IJ SOLN
INTRAMUSCULAR | Status: DC | PRN
  Administered 2012-02-09: 0.6 mg via INTRAVENOUS

## 2012-02-09 MED ORDER — ACETAMINOPHEN 650 MG RE SUPP: 650.0000 mg | RECTAL | Status: DC | PRN

## 2012-02-09 MED ORDER — BUPROPION HCL ER (XL) 300 MG PO TB24
300.0000 mg | ORAL_TABLET | Freq: Every day | ORAL | Status: DC
  Filled 2012-02-09: qty 1

## 2012-02-09 MED ORDER — SODIUM CHLORIDE 0.9 % IV SOLN: 250.0000 mL | INTRAVENOUS | Status: DC

## 2012-02-09 MED ORDER — ALBUTEROL SULFATE HFA 108 (90 BASE) MCG/ACT IN AERS
2.0000 | INHALATION_SPRAY | Freq: Four times a day (QID) | RESPIRATORY_TRACT | Status: DC | PRN
  Filled 2012-02-09: qty 6.7

## 2012-02-09 MED ORDER — HEMOSTATIC AGENTS (NO CHARGE) OPTIME
TOPICAL | Status: DC | PRN
  Administered 2012-02-09: 1 via TOPICAL

## 2012-02-09 MED ORDER — TAMSULOSIN HCL 0.4 MG PO CAPS
0.4000 mg | ORAL_CAPSULE | Freq: Every day | ORAL | Status: DC
  Filled 2012-02-09: qty 1

## 2012-02-09 MED ORDER — AMITRIPTYLINE HCL 100 MG PO TABS
100.0000 mg | ORAL_TABLET | Freq: Every day | ORAL | Status: DC
Start: 2012-02-09 — End: 2012-02-09
  Filled 2012-02-09: qty 1

## 2012-02-09 MED ORDER — ONDANSETRON HCL 4 MG/2ML IJ SOLN: 4.0000 mg | INTRAMUSCULAR | Status: DC | PRN

## 2012-02-09 MED ORDER — ONDANSETRON HCL 4 MG/2ML IJ SOLN
INTRAMUSCULAR | Status: DC | PRN
  Administered 2012-02-09: 4 mg via INTRAVENOUS

## 2012-02-09 MED ORDER — BACITRACIN 50000 UNITS IM SOLR
INTRAMUSCULAR | Status: AC
  Filled 2012-02-09: qty 1

## 2012-02-09 SURGICAL SUPPLY — 54 items
ADH SKN CLS APL DERMABOND .7 (GAUZE/BANDAGES/DRESSINGS) ×1
BAG DECANTER FOR FLEXI CONT (MISCELLANEOUS) ×2 IMPLANT
BLADE SURG 15 STRL LF DISP TIS (BLADE) ×1 IMPLANT
BLADE SURG 15 STRL SS (BLADE) ×2
BLADE SURG ROTATE 9660 (MISCELLANEOUS) IMPLANT
CANISTER SUCTION 2500CC (MISCELLANEOUS) ×2 IMPLANT
CLOTH BEACON ORANGE TIMEOUT ST (SAFETY) ×2 IMPLANT
CONT SPEC 4OZ CLIKSEAL STRL BL (MISCELLANEOUS) ×2 IMPLANT
DECANTER SPIKE VIAL GLASS SM (MISCELLANEOUS) ×1 IMPLANT
DERMABOND ADVANCED (GAUZE/BANDAGES/DRESSINGS) ×1
DERMABOND ADVANCED .7 DNX12 (GAUZE/BANDAGES/DRESSINGS) ×2 IMPLANT
DRAPE C-ARM 42X72 X-RAY (DRAPES) IMPLANT
DRAPE LAPAROTOMY 100X72 PEDS (DRAPES) ×2 IMPLANT
DRAPE MICROSCOPE LEICA (MISCELLANEOUS) ×2 IMPLANT
DRAPE POUCH INSTRU U-SHP 10X18 (DRAPES) ×2 IMPLANT
DRAPE PROXIMA HALF (DRAPES) ×1 IMPLANT
DURAPREP 6ML APPLICATOR 50/CS (WOUND CARE) ×2 IMPLANT
ELECT BLADE 6.5 EXT (BLADE) ×2 IMPLANT
ELECT REM PT RETURN 9FT ADLT (ELECTROSURGICAL) ×2
ELECTRODE REM PT RTRN 9FT ADLT (ELECTROSURGICAL) ×1 IMPLANT
GAUZE SPONGE 4X4 16PLY XRAY LF (GAUZE/BANDAGES/DRESSINGS) IMPLANT
GLOVE BIO SURGEON STRL SZ8.5 (GLOVE) ×1 IMPLANT
GLOVE BIOGEL PI IND STRL 8.5 (GLOVE) ×1 IMPLANT
GLOVE BIOGEL PI INDICATOR 8.5 (GLOVE) ×2
GLOVE ECLIPSE 8.5 STRL (GLOVE) ×2 IMPLANT
GLOVE EXAM NITRILE LRG STRL (GLOVE) IMPLANT
GLOVE EXAM NITRILE MD LF STRL (GLOVE) IMPLANT
GLOVE EXAM NITRILE XL STR (GLOVE) IMPLANT
GLOVE EXAM NITRILE XS STR PU (GLOVE) IMPLANT
GLOVE SS BIOGEL STRL SZ 8 (GLOVE) IMPLANT
GLOVE SUPERSENSE BIOGEL SZ 8 (GLOVE) ×1
GLOVE SURG SS PI 8.0 STRL IVOR (GLOVE) ×1 IMPLANT
GOWN BRE IMP SLV AUR LG STRL (GOWN DISPOSABLE) IMPLANT
GOWN BRE IMP SLV AUR XL STRL (GOWN DISPOSABLE) ×1 IMPLANT
GOWN STRL REIN 2XL LVL4 (GOWN DISPOSABLE) ×3 IMPLANT
KIT BASIN OR (CUSTOM PROCEDURE TRAY) ×2 IMPLANT
KIT ROOM TURNOVER OR (KITS) ×2 IMPLANT
NDL HYPO 18GX1.5 BLUNT FILL (NEEDLE) IMPLANT
NDL SPNL 20GX3.5 QUINCKE YW (NEEDLE) IMPLANT
NEEDLE HYPO 18GX1.5 BLUNT FILL (NEEDLE) IMPLANT
NEEDLE HYPO 22GX1.5 SAFETY (NEEDLE) ×2 IMPLANT
NEEDLE SPNL 20GX3.5 QUINCKE YW (NEEDLE) ×2 IMPLANT
NS IRRIG 1000ML POUR BTL (IV SOLUTION) ×2 IMPLANT
PACK LAMINECTOMY NEURO (CUSTOM PROCEDURE TRAY) ×2 IMPLANT
PAD ARMBOARD 7.5X6 YLW CONV (MISCELLANEOUS) ×6 IMPLANT
RUBBERBAND STERILE (MISCELLANEOUS) ×2 IMPLANT
SPONGE SURGIFOAM ABS GEL SZ50 (HEMOSTASIS) ×2 IMPLANT
SUT VIC AB 3-0 SH 8-18 (SUTURE) ×2 IMPLANT
SYR 20ML ECCENTRIC (SYRINGE) ×2 IMPLANT
SYR 5ML LL (SYRINGE) IMPLANT
TOWEL OR 17X24 6PK STRL BLUE (TOWEL DISPOSABLE) ×2 IMPLANT
TOWEL OR 17X26 10 PK STRL BLUE (TOWEL DISPOSABLE) ×2 IMPLANT
WATER STERILE IRR 1000ML POUR (IV SOLUTION) ×2 IMPLANT
WIRE TIP MIS 2.5MM NEURO (BURR) ×1 IMPLANT

## 2012-02-09 NOTE — H&P (Signed)
   Terry Morrow  #[id number]  DOB:  [birth year]       CHIEF COMPLAINT:   Left arm and hand pain and weakness, numbness and tingling.    HISTORY OF PRESENT ILLNESS:  Terry Morrow is a 61 year old, right-handed individual whom I have seen in the past for degenerative disc disease in his cervical spine.  Back in May of 1993 I did an anterior decompression and arthrodesis at C5-6 and at C6-C7.  Meiko tolerated that surgery well and he has done fairly well, except in the last few months he has developed severe pain in that left shoulder and arm.  He notes that he was recovering from a right shoulder rotator cuff surgery and was trying to do some physical therapy when this whole process started.  He notes he has been absolutely miserable.  He has had a steroid injection by Naaman Plummer which didn't help.  He has had a course of oral steroid medication.  He only finds the pain seems to be getting worse.  An MRI of his cervical spine was completed on 01/04/12 and he is seen now for further consultation regarding some advice as to what to do.    [name] notes that he is extremely uncomfortable.  The only way he can get even transient relief is by lying down.  He has been on some strong narcotic pain medications and notes that they don't seem to be attacking the pain adequately.    PAST MEDICAL HISTORY:  Terry Morrow is a generally healthy individual.    PERSONAL HISTORY:   He has a significant history of smoking about a pack of cigarettes a day.  He did quit for a long period of time.  He does not drink alcohol.  Height and weight have been stable at 5', 11" and 172 lbs.    MEDICATIONS:    Current medications include Gabapentin 2 tablets a day, Metoprolol 100 mg daily to regulate his heart rate, Topiramate 100 mg for migraines, Pravastatin a table a day for cholesterol, Baclofen one a day for migraines, Hydrocodone as needed, Zolpidem for sleeping, Amitriptyline for sleep and Tamsulosin for his prostate.    PHYSICAL  EXAMINATION:  His physical exam at this time reveals that he has weakness of the intrinsic muscles of the left hand compared to the right hand.  He has some atrophy in the first dorsal interosseous.  His extensor digiti quinti is weak also.  He has good strength in the bicep, tricep and wrist extensor.  He has marked tenderness to palpation in the supraclavicular fossa on the left side.    IMPRESSION:    The patient has an extruded fragment of disc at C7-T1 on the left side by his MRI that was completed a month ago.  He has been truly miserable with pain and wants something definitive done.  Indicated that probably the best way to approach this would be with a posterior decompression using a METRX technique at C7-T1 on the left side.  We will try to expedite his care at the earliest possible convenience as he is in severe pain.  In the meantime, he will have to continue on the nonsteroidal anti-inflammatory and narcotic pain medication for pain control.  I will provide him a prescription for Hydrocodone 10/325's to see if this helps control his discomfort.

## 2012-02-09 NOTE — Anesthesia Preprocedure Evaluation (Addendum)
Anesthesia Evaluation  Patient identified by MRN, date of birth, ID band  Reviewed: Allergy & Precautions, H&P , NPO status , Patient's Chart, lab work & pertinent test results  Airway Mallampati: II TM Distance: >3 FB Neck ROM: Limited    Dental  (+) Teeth Intact and Dental Advisory Given   Pulmonary asthma , COPD breath sounds clear to auscultation        Cardiovascular hypertension, Pt. on medications Rhythm:Regular Rate:Normal     Neuro/Psych  Headaches, Anxiety Depression    GI/Hepatic Neg liver ROS, GERD-  Medicated,  Endo/Other  negative endocrine ROS  Renal/GU      Musculoskeletal negative musculoskeletal ROS (+)   Abdominal   Peds  Hematology negative hematology ROS (+)   Anesthesia Other Findings   Reproductive/Obstetrics                          Anesthesia Physical Anesthesia Plan  ASA: III  Anesthesia Plan: General   Post-op Pain Management:    Induction: Intravenous  Airway Management Planned: Oral ETT  Additional Equipment: CVP  Intra-op Plan:   Post-operative Plan: Extubation in OR  Informed Consent:   Dental advisory given  Plan Discussed with: Surgeon, Anesthesiologist and CRNA  Anesthesia Plan Comments:       Anesthesia Quick Evaluation

## 2012-02-09 NOTE — Preoperative (Signed)
Beta Blockers   Reason not to administer Beta Blockers:Not Applicable 

## 2012-02-09 NOTE — Progress Notes (Signed)
Patient ID: Terry Morrow, male   DOB: 01/05/1951, 61 y.o.   MRN: [id number] Vital signs are stable postop, patient feels well. Patient notes less pain in left upper extremity. Some dysesthesias persist in fingertips. Incisions clean and dry. Ready for discharge.

## 2012-02-09 NOTE — Anesthesia Postprocedure Evaluation (Signed)
  Anesthesia Post-op Note  Patient: Terry Morrow  Procedure(s) Performed: Procedure(s) (LRB) with comments: POSTERIOR CERVICAL LAMINECTOMY WITH MET- RX (Left) - Cervical seven-thoracic one, left microdiscectomy with Met-RX.  Patient Location: PACU  Anesthesia Type: General  Level of Consciousness: awake  Airway and Oxygen Therapy: Patient Spontanous Breathing  Post-op Pain: mild  Post-op Assessment: Post-op Vital signs reviewed  Post-op Vital Signs: Reviewed  Complications: No apparent anesthesia complications

## 2012-02-09 NOTE — Op Note (Signed)
Date of operation: 02/09/2012 Preoperative diagnosis: Herniated nucleus pulposus C7-T1 left left C8 radiculopathy, status post decompression and fusion C3-C5 anteriorly and posteriorly Post operative diagnosis: Herniated nucleus pulposus C7-T1 left with left C8 radiculopathy, status post decompression and fusion C3-C5 anteriorly and posteriorly Procedure: Microdiscectomy C7-T1 left with Metrix operating microscope microdissection technique, fluoroscopic guidance Surgeon: Barnett Abu M.D. Assistant: Delma Officer M.D. Anesthesia: Gen. endotracheal Indications: Patient is a 61 year old individual is had previous significant cervical difficulties is had decompression arthrodesis anteriorly from C3 down to C5 is had posterior supplemental fusion for pseudoarthrosis. He is encouraged left arm pain and hand weakness has been getting progressively worse for a few months time he was seen in the office a week ago and advised regarding the need for surgery as he had presence of a extruded fragment of disc in the left lateral gutter at C7-T1.   Procedure: Patient was brought to the operating room supine on a stretcher having had central venous monitoring catheter in appropriate other lines placed in the preoperative holding area. After the smooth induction of general endotracheal anesthesia the patient was placed in a 3 pin headrest and then carefully placed into the seated position the back of the neck exposed. Fluoroscopy imaging was brought into the cross table lateral position to view the vertebrae of the cervical spine. Back of the neck was cleansed with alcohol and DuraPrep and draped in a sterile fashion. Localizing radiographs with a small 22-gauge needle identifying the surface anatomy was used to localize the appropriate level. C7-T1 Was identified positively on the radiograph and correlated with fluoroscopy. Skin in this area was infiltrated with 10 cc of 1% lidocaine mixed 50-50 with half percent Marcaine  and 1-100,000 epinephrine. A small vertical incision was created over this area and a K wire was then passed to the interlaminar space of C7-T1. A wanding technique a series of dilators was used to create an opening down to the interlaminar space. Ultimately a 5 cm centimeter by 18 mm diameter cannula was placed in the wound and fixed to the operating table with a clamp. The operating microscope was brought into the field and through the aperture soft tissues above the interlaminar space of C7-T1 were clean. The inferior margin of the lamina of C7 and the superior margin of the lamina and facet complex of C7-T1 were then removed with 2.2 mm high-speed bur. Soft tissues in this area were then cauterized and divided and this identified the path of the C8 nerve root. This was carefully dissected and by working from underneath the nerve root a significant mass of tissue was identified. When this was incised with a #11 blade under moderate pressure some fragments of this extruded themselves. These were removed with a micropituitary rongeur. Further dissection yielded other fragments of disc which were similarly removed. Dissection over the shoulder of the nerve was then performed and this yielded further fragments of disc. This procedure continued from above and below the exiting nerve root until all fragments were removed. The stasis was then carefully obtained with some small pledgets of Gelfoam soaked in thrombin which were then tear gated away. When adequate decompression was completed the area was inspected for hemostasis yet again and then the endoscopic cannula was removed and the fascia and the lung was closed with 3-0 Vicryl in interrupted fashion and the skin was closed with 3-0 Vicryl in an inverted interrupted fashion. Dermabond was placed on the skin blood loss was less than 10 cc.

## 2012-02-09 NOTE — Progress Notes (Signed)
May discontinue right IJ CVC in the pacu per dr. Danielle Dess

## 2012-02-09 NOTE — Anesthesia Postprocedure Evaluation (Signed)
  Anesthesia Post-op Note  Patient: Terry Morrow  Procedure(s) Performed: Procedure(s) (LRB) with comments: POSTERIOR CERVICAL LAMINECTOMY WITH MET- RX (Left) - Cervical seven-thoracic one, left microdiscectomy with Met-RX.  Patient Location: PACU  Anesthesia Type: General  Level of Consciousness: awake  Airway and Oxygen Therapy: Patient Spontanous Breathing  Post-op Pain: mild  Post-op Assessment: Post-op Vital signs reviewed  Post-op Vital Signs: Reviewed  Complications: No apparent anesthesia complications 

## 2012-02-09 NOTE — Discharge Summary (Signed)
Admitting diagnosis: Herniated nucleus pulposus C7-T1 left with left cervical radiculopathy Discharge and final diagnosis: Herniated nucleus pulposus C7-T1 left with left cervical radiculopathy, status post arthrodesis C3-C5 anteriorly and posteriorly.  condition on discharge: Improved Prescriptions written: Valium 5 mg #30 no refills Major operation: Microdiscectomy C7-T1 left Metrix operating system and operating microscope, microdissection technique

## 2012-02-09 NOTE — Progress Notes (Signed)
Pt ambulating very well. Pt's pain is very minimal. Pt and wife given D/C instructions with Rx., both verbalized understanding. Pt D/C'd home via wheelchair @ 1735 per MD order. Rema Fendt, RN

## 2012-02-09 NOTE — Transfer of Care (Signed)
Immediate Anesthesia Transfer of Care Note  Patient: Terry Morrow  Procedure(s) Performed: Procedure(s) (LRB) with comments: POSTERIOR CERVICAL LAMINECTOMY WITH MET- RX (Left) - Cervical seven-thoracic one, left microdiscectomy with Met-RX.  Patient Location: PACU  Anesthesia Type: General  Level of Consciousness: awake, alert  and oriented  Airway & Oxygen Therapy: Patient Spontanous Breathing and Patient connected to face mask oxygen  Post-op Assessment: Report given to PACU RN  Post vital signs: Reviewed and stable  Complications: No apparent anesthesia complications

## 2012-02-09 NOTE — Anesthesia Procedure Notes (Signed)
Procedures RIJ Dual Lumen CVP: 4742-5956: The patient was identified and consent obtained.  TO was performed, and full barrier precautions were used.  The skin was anesthetized with lidocaine.  Once the vein was located with the 22 ga. needle using ultrasound guidance , the wire was inserted into the vein.  The wire location was confirmed with ultrasound.  The tissue was dilated and the catheter was carefully inserted, then sutured in place. A dressing was applied. The patient tolerated the procedure well.  CXR was ordered for PACU. CE

## 2012-08-13 ENCOUNTER — Other Ambulatory Visit (HOSPITAL_COMMUNITY): Payer: Self-pay | Admitting: Physician Assistant

## 2012-08-13 DIAGNOSIS — R51 Headache: Secondary | ICD-10-CM

## 2012-08-15 ENCOUNTER — Ambulatory Visit (HOSPITAL_COMMUNITY): Admission: RE | Admit: 2012-08-15 | Payer: Medicare Other | Source: Ambulatory Visit

## 2012-08-21 ENCOUNTER — Encounter (HOSPITAL_COMMUNITY): Payer: Self-pay

## 2012-08-21 ENCOUNTER — Ambulatory Visit (HOSPITAL_COMMUNITY)
Admission: RE | Admit: 2012-08-21 | Discharge: 2012-08-21 | Disposition: A | Discharge status: Home / Self Care | Payer: Medicare Other | Source: Ambulatory Visit | Attending: Physician Assistant | Admitting: Physician Assistant

## 2012-08-21 DIAGNOSIS — R51 Headache: Secondary | ICD-10-CM | POA: Insufficient documentation

## 2012-11-05 ENCOUNTER — Encounter (HOSPITAL_COMMUNITY): Payer: Self-pay | Admitting: Pharmacy Technician

## 2012-11-05 NOTE — H&P (Signed)
Assessment   History of left mastoidectomy (V45.89) (Z98.89).  History of cholesteatoma (V12.49) (Z86.69).  Conductive hearing loss (389.00) (H90.2).  Smokes tobacco daily (305.1) (F17.200). Discussed  Left mastoidectomy many years ago, with resulting chronic conductive hearing loss. He has been bothered by this for many years. He also has chronic discharge from the ear.    On exam, the left mastoid cavity is clean and healthy. Audiogram from last visit reviewed. Options discussed included traditional hearing aid or BAHA. He has had a lot of drainage from the ear. Given that history, BAHA would be the better choice. He is interested in pursuing this.   Strongly urged him to stop smoking. Reason For Visit  Discuss surgery left ear. Allergies  No Known Drug Allergies. Current Meds  PredniSONE 10 MG Oral Tablet;; RPT Xanax 0.25 MG Oral Tablet (ALPRAZolam);; RPT Albuterol Sulfate (2.5 MG/3ML) 0.083% Inhalation Nebulization Solution;; RPT Amitriptyline HCl - 100 MG Oral Tablet;; RPT Metoprolol Tartrate 100 MG Oral Tablet;; RPT BuPROPion HCl TABS;; RPT Flonase 50 MCG/ACT Nasal Suspension (Fluticasone Propionate);; RPT Hydrocodone-Acetaminophen 7.5-325 MG Oral Tablet;; RPT Gabapentin 300 MG Oral Capsule;; RPT Zanaflex 4 MG Oral Capsule (TiZANidine HCl);; RPT Pravastatin Sodium 40 MG Oral Tablet;; RPT Zolpidem Tartrate 10 MG Oral Tablet;; RPT SUMAtriptan Succinate 50 MG Oral Tablet;; RPT Topiramate 200 MG Oral Tablet;; RPT Tamsulosin HCl - 0.4 MG Oral Capsule;; RPT Levofloxacin 500 MG Oral Tablet;; RPT. Albuterol AERS;; Qty0; R0; RPT. Past Meds  Xanax 0.25 MG Oral Tablet(ALPRAZolam);; Qty0; R0; RPT. Active Problems  Emphysema  (492.8) (J43.9) Eustachian tube dysfunction  (381.81) (H69.80) Hearing loss  (389.9) (H91.90) History of cholesteatoma  (V12.49) (Z86.69) History of left mastoidectomy  (V45.89) (Z98.89) Hyperlipidemia  (272.4) (E78.5) Migraine headache  (346.90)  (G43.909) Mixed conductive and sensorineural hearing loss  (389.20) (H90.8) Reflux  (530.81) (K21.9) Sensorineural hearing loss  (389.10) (H90.5) Tinnitus of left ear  (388.30) (H93.12). PSH  Ear Surgery Kidney Surgery Knee Surgery Neck Surgery Neuroplasty Decompression Median Nerve At Carpal Tunnel Oral Surgery Tooth Extraction Shoulder Surgery Sinus Surgery. Family Hx  Family history of allergic rhinitis (V19.6) (Z84.89) Family history of migraine headaches (V17.2) (Z82.0) No pertinent family history: Mother. Personal Hx  Current every day smoker (305.1) (F17.200) Daily caffeine consumption, 2-3 servings a day Hearing loss (389.9) (H91.90). Signature  Electronically signed by : Serena Colonel  M.D.; 09/11/2012 9:19 PM EST.

## 2012-11-08 ENCOUNTER — Encounter (HOSPITAL_COMMUNITY)
Admission: RE | Admit: 2012-11-08 | Discharge: 2012-11-08 | Disposition: A | Discharge status: Home / Self Care | Payer: Medicare Other | Source: Ambulatory Visit | Attending: Otolaryngology | Admitting: Otolaryngology

## 2012-11-08 ENCOUNTER — Encounter (HOSPITAL_COMMUNITY): Payer: Self-pay

## 2012-11-08 HISTORY — DX: Shortness of breath: R06.02

## 2012-11-08 HISTORY — DX: Gastro-esophageal reflux disease without esophagitis: K21.9

## 2012-11-08 HISTORY — DX: Myoneural disorder, unspecified: G70.9

## 2012-11-08 HISTORY — DX: Nausea with vomiting, unspecified: R11.2

## 2012-11-08 HISTORY — DX: Other specified postprocedural states: Z98.890

## 2012-11-08 LAB — BASIC METABOLIC PANEL
BUN: 15 mg/dL (ref 6–23)
Calcium: 9.4 mg/dL (ref 8.4–10.5)
Creatinine, Ser: 1.1 mg/dL (ref 0.50–1.35)
GFR calc Af Amer: 81 mL/min — ABNORMAL LOW (ref >90)
GFR calc non Af Amer: 70 mL/min — ABNORMAL LOW (ref >90)
Glucose, Bld: 96 mg/dL (ref 70–99)
Potassium: 4.2 mEq/L (ref 3.5–5.1)

## 2012-11-08 LAB — CBC
Hemoglobin: 14.9 g/dL (ref 13.0–17.0)
MCH: 32 pg (ref 26.0–34.0)
MCHC: 35 g/dL (ref 30.0–36.0)
RDW: 13.3 % (ref 11.5–15.5)

## 2012-11-08 LAB — SURGICAL PCR SCREEN: MRSA, PCR: NEGATIVE

## 2012-11-08 NOTE — Pre-Procedure Instructions (Signed)
Terry Morrow  11/08/2012   Your procedure is scheduled on:  11-15-2012  Thursday   Report to [facility] Short Stay Center at 5:30 AM.  If you arrive before 5:30 AM please wait in the main lobby until 5:30 AM as the Short stay Center does not open until then. At 5:30 take the [facility] to the 3rd floor and check in at the [employer].  Call this number if you have problems the morning of surgery: [phone]   Remember:   Do not eat food or drink liquids after midnight.    Take these medicines the morning of surgery with A SIP OF WATER: inhaler  If needed,gabapentin,,pain medication as needed,metoprolol,omeprazole,   Do not wear jewelry,   Do not wear lotions, powders, or perfumes.   Do not shave 48 hours prior to surgery.   Do not bring valuables to the hospital.  [facility] is not responsible   for any belongings or valuables.  Contacts, dentures or bridgework may not be worn into surgery.  Leave suitcase in the car. After surgery it may be brought to your room.   For patients admitted to the hospital, checkout time is 11:00 AM the day of discharge.   Patients discharged the day of surgery will not be allowed to drive home.    Special Instructions: Shower using CHG 2 nights before surgery and the night before surgery.  If you shower the day of surgery use CHG.  Use special wash - you have one bottle of CHG for all showers.  You should use approximately 1/3 of the bottle for each shower.   Please read over the following fact sheets that you were given: Pain Booklet and Surgical Site Infection Prevention

## 2012-11-14 MED ORDER — CEFAZOLIN SODIUM-DEXTROSE 2-3 GM-% IV SOLR
2.0000 g | INTRAVENOUS | Status: AC
  Administered 2012-11-15: 2 g via INTRAVENOUS
  Filled 2012-11-14: qty 50

## 2012-11-15 ENCOUNTER — Encounter (HOSPITAL_COMMUNITY): Payer: Self-pay | Admitting: Anesthesiology

## 2012-11-15 ENCOUNTER — Encounter (HOSPITAL_COMMUNITY)
Admission: RE | Disposition: A | Discharge status: Home / Self Care | Payer: Self-pay | Source: Ambulatory Visit | Attending: Otolaryngology

## 2012-11-15 ENCOUNTER — Encounter (HOSPITAL_COMMUNITY): Payer: Self-pay | Admitting: *Deleted

## 2012-11-15 ENCOUNTER — Ambulatory Visit (HOSPITAL_COMMUNITY)
Admission: RE | Admit: 2012-11-15 | Discharge: 2012-11-15 | Disposition: A | Discharge status: Home / Self Care | Payer: Medicare Other | Source: Ambulatory Visit | Attending: Otolaryngology | Admitting: Otolaryngology

## 2012-11-15 ENCOUNTER — Ambulatory Visit (HOSPITAL_COMMUNITY): Payer: Medicare Other | Admitting: Anesthesiology

## 2012-11-15 DIAGNOSIS — Z01812 Encounter for preprocedural laboratory examination: Secondary | ICD-10-CM | POA: Insufficient documentation

## 2012-11-15 DIAGNOSIS — H908 Mixed conductive and sensorineural hearing loss, unspecified: Secondary | ICD-10-CM | POA: Insufficient documentation

## 2012-11-15 DIAGNOSIS — H9012 Conductive hearing loss, unilateral, left ear, with unrestricted hearing on the contralateral side: Secondary | ICD-10-CM

## 2012-11-15 DIAGNOSIS — J4489 Other specified chronic obstructive pulmonary disease: Secondary | ICD-10-CM | POA: Insufficient documentation

## 2012-11-15 DIAGNOSIS — J449 Chronic obstructive pulmonary disease, unspecified: Secondary | ICD-10-CM | POA: Insufficient documentation

## 2012-11-15 DIAGNOSIS — J438 Other emphysema: Secondary | ICD-10-CM | POA: Insufficient documentation

## 2012-11-15 DIAGNOSIS — F17200 Nicotine dependence, unspecified, uncomplicated: Secondary | ICD-10-CM | POA: Insufficient documentation

## 2012-11-15 DIAGNOSIS — I10 Essential (primary) hypertension: Secondary | ICD-10-CM | POA: Insufficient documentation

## 2012-11-15 HISTORY — PX: BONE ANCHORED HEARING AID IMPLANT: SHX5193

## 2012-11-15 SURGERY — INSERTION, BONE ANCHORED HEARING AID
Anesthesia: General | Site: Ear | Laterality: Left | Wound class: Clean

## 2012-11-15 MED ORDER — FENTANYL CITRATE 0.05 MG/ML IJ SOLN
25.0000 ug | INTRAMUSCULAR | Status: DC | PRN
  Administered 2012-11-15 (×3): 25 ug via INTRAVENOUS

## 2012-11-15 MED ORDER — LIDOCAINE HCL 4 % MT SOLN
OROMUCOSAL | Status: DC | PRN
  Administered 2012-11-15: 4 mL via TOPICAL

## 2012-11-15 MED ORDER — ONDANSETRON HCL 4 MG/2ML IJ SOLN
INTRAMUSCULAR | Status: DC | PRN
  Administered 2012-11-15: 4 mg via INTRAVENOUS

## 2012-11-15 MED ORDER — 0.9 % SODIUM CHLORIDE (POUR BTL) OPTIME
TOPICAL | Status: DC | PRN
  Administered 2012-11-15: 1000 mL

## 2012-11-15 MED ORDER — LIDOCAINE-EPINEPHRINE 1 %-1:100000 IJ SOLN
INTRAMUSCULAR | Status: AC
  Filled 2012-11-15: qty 1

## 2012-11-15 MED ORDER — FENTANYL CITRATE 0.05 MG/ML IJ SOLN
INTRAMUSCULAR | Status: DC | PRN
  Administered 2012-11-15 (×2): 50 ug via INTRAVENOUS

## 2012-11-15 MED ORDER — ARTIFICIAL TEARS OP OINT
TOPICAL_OINTMENT | OPHTHALMIC | Status: DC | PRN
  Administered 2012-11-15: 1 via OPHTHALMIC

## 2012-11-15 MED ORDER — LIDOCAINE HCL (CARDIAC) 20 MG/ML IV SOLN
INTRAVENOUS | Status: DC | PRN
  Administered 2012-11-15: 80 mg via INTRAVENOUS

## 2012-11-15 MED ORDER — EPHEDRINE SULFATE 50 MG/ML IJ SOLN
INTRAMUSCULAR | Status: DC | PRN
  Administered 2012-11-15 (×5): 10 mg via INTRAVENOUS

## 2012-11-15 MED ORDER — LACTATED RINGERS IV SOLN: INTRAVENOUS | Status: DC

## 2012-11-15 MED ORDER — BACITRACIN ZINC 500 UNIT/GM EX OINT
TOPICAL_OINTMENT | CUTANEOUS | Status: DC | PRN
  Administered 2012-11-15: 1 via TOPICAL

## 2012-11-15 MED ORDER — PROMETHAZINE HCL 25 MG RE SUPP: 25.0000 mg | Freq: Four times a day (QID) | RECTAL | Status: DC | PRN

## 2012-11-15 MED ORDER — MIDAZOLAM HCL 5 MG/5ML IJ SOLN
INTRAMUSCULAR | Status: DC | PRN
  Administered 2012-11-15: 1 mg via INTRAVENOUS

## 2012-11-15 MED ORDER — DEXAMETHASONE SODIUM PHOSPHATE 4 MG/ML IJ SOLN
INTRAMUSCULAR | Status: DC | PRN
  Administered 2012-11-15: 4 mg via INTRAVENOUS

## 2012-11-15 MED ORDER — MINERAL OIL LIGHT 100 % EX OIL
TOPICAL_OIL | CUTANEOUS | Status: AC
  Filled 2012-11-15: qty 25

## 2012-11-15 MED ORDER — NEOSTIGMINE METHYLSULFATE 1 MG/ML IJ SOLN
INTRAMUSCULAR | Status: DC | PRN
  Administered 2012-11-15: 3 mg via INTRAVENOUS

## 2012-11-15 MED ORDER — PHENYLEPHRINE HCL 10 MG/ML IJ SOLN
INTRAMUSCULAR | Status: DC | PRN
  Administered 2012-11-15 (×3): 80 ug via INTRAVENOUS
  Administered 2012-11-15: 40 ug via INTRAVENOUS
  Administered 2012-11-15: 80 ug via INTRAVENOUS
  Administered 2012-11-15: 120 ug via INTRAVENOUS
  Administered 2012-11-15 (×4): 80 ug via INTRAVENOUS

## 2012-11-15 MED ORDER — GLYCOPYRROLATE 0.2 MG/ML IJ SOLN
INTRAMUSCULAR | Status: DC | PRN
  Administered 2012-11-15: 0.4 mg via INTRAVENOUS
  Administered 2012-11-15: 0.2 mg via INTRAVENOUS

## 2012-11-15 MED ORDER — LACTATED RINGERS IV SOLN
INTRAVENOUS | Status: DC | PRN
  Administered 2012-11-15 (×2): via INTRAVENOUS

## 2012-11-15 MED ORDER — PHENYLEPHRINE HCL 10 MG/ML IJ SOLN: 10.0000 mg | INTRAVENOUS | Status: DC | PRN

## 2012-11-15 MED ORDER — CEPHALEXIN 500 MG PO CAPS: 500.0000 mg | ORAL_CAPSULE | Freq: Three times a day (TID) | ORAL | Status: DC

## 2012-11-15 MED ORDER — DEXTROSE 5 % IV SOLN
10.0000 mg | INTRAVENOUS | Status: DC | PRN
  Administered 2012-11-15: 50 ug/min via INTRAVENOUS

## 2012-11-15 MED ORDER — PROPOFOL 10 MG/ML IV BOLUS
INTRAVENOUS | Status: DC | PRN
  Administered 2012-11-15: 200 mg via INTRAVENOUS

## 2012-11-15 MED ORDER — LIDOCAINE-EPINEPHRINE 1 %-1:100000 IJ SOLN
INTRAMUSCULAR | Status: DC | PRN
  Administered 2012-11-15: 1 mL

## 2012-11-15 MED ORDER — BACITRACIN ZINC 500 UNIT/GM EX OINT
TOPICAL_OINTMENT | CUTANEOUS | Status: AC
  Filled 2012-11-15: qty 15

## 2012-11-15 MED ORDER — ATROPINE SULFATE 0.4 MG/ML IJ SOLN
INTRAMUSCULAR | Status: DC | PRN
  Administered 2012-11-15 (×2): .1 mg via INTRAVENOUS

## 2012-11-15 MED ORDER — HYDROCODONE-ACETAMINOPHEN 7.5-325 MG PO TABS: 1.0000 | ORAL_TABLET | Freq: Four times a day (QID) | ORAL | Status: DC | PRN

## 2012-11-15 MED ORDER — METOCLOPRAMIDE HCL 5 MG/ML IJ SOLN
INTRAMUSCULAR | Status: DC | PRN
  Administered 2012-11-15: 10 mg via INTRAVENOUS

## 2012-11-15 MED ORDER — ROCURONIUM BROMIDE 100 MG/10ML IV SOLN
INTRAVENOUS | Status: DC | PRN
  Administered 2012-11-15: 40 mg via INTRAVENOUS

## 2012-11-15 MED ORDER — FENTANYL CITRATE 0.05 MG/ML IJ SOLN
INTRAMUSCULAR | Status: AC
  Filled 2012-11-15: qty 2

## 2012-11-15 SURGICAL SUPPLY — 51 items
BIT DRILL WIDENING 4MM (DRILL) IMPLANT
BLADE DERMATOME DISP (MISCELLANEOUS) IMPLANT
BLADE SURG 15 STRL LF DISP TIS (BLADE) ×1 IMPLANT
BLADE SURG 15 STRL SS (BLADE) ×2
BLADE SURG ROTATE 9660 (MISCELLANEOUS) ×2 IMPLANT
CANISTER SUCTION 2500CC (MISCELLANEOUS) ×2 IMPLANT
CAP HEALING (MISCELLANEOUS) IMPLANT
CLEANER TIP ELECTROSURG 2X2 (MISCELLANEOUS) ×2 IMPLANT
CLOTH BEACON ORANGE TIMEOUT ST (SAFETY) ×2 IMPLANT
CORDS BIPOLAR (ELECTRODE) IMPLANT
COVER SURGICAL LIGHT HANDLE (MISCELLANEOUS) ×2 IMPLANT
CRADLE DONUT ADULT HEAD (MISCELLANEOUS) ×1 IMPLANT
DECANTER SPIKE VIAL GLASS SM (MISCELLANEOUS) ×1 IMPLANT
DRAPE INCISE 23X17 IOBAN STRL (DRAPES)
DRAPE INCISE 23X17 STRL (DRAPES) IMPLANT
DRAPE INCISE IOBAN 23X17 STRL (DRAPES) IMPLANT
DRILL COUNTERSINK 3MM (DRILL) IMPLANT
DRILL COUNTERSINK 4MM (DRILL) IMPLANT
DRILL GUIDE 3-4MM (DRILL) IMPLANT
DRILL GUIDE 3/4MM (DRILL) ×1 IMPLANT
DRILL WIDENING 4MM (DRILL) ×2
ELECT COATED BLADE 2.86 ST (ELECTRODE) ×2 IMPLANT
ELECT REM PT RETURN 9FT ADLT (ELECTROSURGICAL) ×2
ELECTRODE REM PT RTRN 9FT ADLT (ELECTROSURGICAL) ×1 IMPLANT
GAUZE SPONGE 4X4 16PLY XRAY LF (GAUZE/BANDAGES/DRESSINGS) ×5 IMPLANT
GLOVE BIO SURGEON STRL SZ7 (GLOVE) ×1 IMPLANT
GLOVE BIO SURGEON STRL SZ7.5 (GLOVE) ×2 IMPLANT
GLOVE BIOGEL PI IND STRL 7.0 (GLOVE) IMPLANT
GLOVE BIOGEL PI INDICATOR 7.0 (GLOVE) ×1
GLOVE ECLIPSE 6.5 STRL STRAW (GLOVE) ×1 IMPLANT
GLOVE ECLIPSE 7.0 STRL STRAW (GLOVE) ×1 IMPLANT
GOWN PREVENTION PLUS LG XLONG (DISPOSABLE) ×4 IMPLANT
IMPL EAR ABUTMENT 4M W/8M (Prosthesis and Implant ENT) IMPLANT
IMPLANT EAR ABUTMENT 4M W/8M (Prosthesis and Implant ENT) ×2 IMPLANT
KIT BASIN OR (CUSTOM PROCEDURE TRAY) ×2 IMPLANT
KIT ROOM TURNOVER OR (KITS) ×2 IMPLANT
NEEDLE 27GAX1X1/2 (NEEDLE) ×1 IMPLANT
NS IRRIG 1000ML POUR BTL (IV SOLUTION) ×2 IMPLANT
PAD ARMBOARD 7.5X6 YLW CONV (MISCELLANEOUS) ×4 IMPLANT
PENCIL FOOT CONTROL (ELECTRODE) ×2 IMPLANT
PUNCH BIOPSY 4MM COCHLEAR (MISCELLANEOUS) ×1 IMPLANT
SUT BONE WAX W31G (SUTURE) IMPLANT
SUT ETHILON 4 0 PS 2 18 (SUTURE) ×2 IMPLANT
SUT VIC AB 4-0 P-3 18X BRD (SUTURE) ×1 IMPLANT
SUT VIC AB 4-0 P3 18 (SUTURE) ×2
SYR BULB 3OZ (MISCELLANEOUS) IMPLANT
TOWEL OR 17X24 6PK STRL BLUE (TOWEL DISPOSABLE) ×2 IMPLANT
TOWEL OR 17X26 10 PK STRL BLUE (TOWEL DISPOSABLE) ×2 IMPLANT
TRAY ENT MC OR (CUSTOM PROCEDURE TRAY) ×2 IMPLANT
WATER STERILE IRR 1000ML POUR (IV SOLUTION) ×1 IMPLANT
WIPE INSTRUMENT VISIWIPE 73X73 (MISCELLANEOUS) IMPLANT

## 2012-11-15 NOTE — Anesthesia Postprocedure Evaluation (Signed)
  Anesthesia Post-op Note  Patient: Terry Morrow  Procedure(s) Performed: Procedure(s) (LRB): LEFT BONE ANCHORED HEARING AID (BAHA) IMPLANT (Left)  Patient Location: PACU  Anesthesia Type: General  Level of Consciousness: awake and alert   Airway and Oxygen Therapy: Patient Spontanous Breathing  Post-op Pain: mild  Post-op Assessment: Post-op Vital signs reviewed, Patient's Cardiovascular Status Stable, Respiratory Function Stable, Patent Airway and No signs of Nausea or vomiting  Last Vitals:  Filed Vitals:   11/15/12 0915  BP:   Pulse: 70  Temp: 36.6 C  Resp: 12    Post-op Vital Signs: stable   Complications: No apparent anesthesia complications

## 2012-11-15 NOTE — Op Note (Signed)
OPERATIVE REPORT  DATE OF SURGERY: 11/15/2012  PATIENT:  Terry Morrow,  62 y.o. male  PRE-OPERATIVE DIAGNOSIS:  LEFT HEARING LOSS  POST-OPERATIVE DIAGNOSIS:  LEFT HEARING LOSS  PROCEDURE:  Procedure(s): LEFT BONE ANCHORED HEARING AID (BAHA) IMPLANT  SURGEON:  Susy Frizzle, MD  ASSISTANTS: none  ANESTHESIA:   General   EBL:  10 ml  DRAINS: none  LOCAL MEDICATIONS USED:  1% Xylocaine with epinephrine  SPECIMEN:  none  COUNTS:  Correct  PROCEDURE DETAILS: The patient was taken to the operating room and placed on the operating table in the supine position. Following induction of general endotracheal anesthesia, the left postauricular hair was shaved. A site was measured 5.5 cm posterior to the ear canal with the lateral process or line up appropriately at the top of the pinna. The depth of soft tissue was measured at 5 mm it so and an 8 mm abutment was used. A skin punch was used following anesthetic application. Periosteum was incised in a cruciate manner. The incision was then extended superiorly and inferiorly allowing for better visualization. The initial drill was used to create the opening using the guide which was then subsequently removed. The drill was then used to complete the initial opening. Irrigation was used and the depth of the hole was assessed to make sure they're still bone which there was. The widening drill was then used down to the countersink portion. The abutment was then placed by hand as the abutment attachment was missing from are sent today. The abutment locked into place very nicely and was then tightened. The wound was irrigated. Vicryl suture was used one on each side of the abutment along the incision. Bacitracin was applied and the healing Was then applied. Patient was then awakened, extubated and transferred to recovery in stable condition.    PATIENT DISPOSITION:  To PACU, stable

## 2012-11-15 NOTE — Transfer of Care (Signed)
Immediate Anesthesia Transfer of Care Note  Patient: Terry Morrow  Procedure(s) Performed: Procedure(s): LEFT BONE ANCHORED HEARING AID (BAHA) IMPLANT (Left)  Patient Location: PACU  Anesthesia Type:General  Level of Consciousness: awake, alert , oriented and sedated  Airway & Oxygen Therapy: Patient Spontanous Breathing and Patient connected to nasal cannula oxygen  Post-op Assessment: Report given to PACU RN, Post -op Vital signs reviewed and stable and Patient moving all extremities  Post vital signs: Reviewed and stable  Complications: No apparent anesthesia complications

## 2012-11-15 NOTE — Interval H&P Note (Signed)
History and Physical Interval Note:  11/15/2012 7:14 AM  Terry Morrow  has presented today for surgery, with the diagnosis of LEFT HEARING LOSS  The various methods of treatment have been discussed with the patient and family. After consideration of risks, benefits and other options for treatment, the patient has consented to  Procedure(s): LEFT BONE ANCHORED HEARING AID (BAHA) IMPLANT (Left) as a surgical intervention .  The patient's history has been reviewed, patient examined, no change in status, stable for surgery.  I have reviewed the patient's chart and labs.  Questions were answered to the patient's satisfaction.     Tennie Grussing

## 2012-11-15 NOTE — Progress Notes (Signed)
Patient reports that he started hurting prior to leaving. Offered to get patient additional pain medication refused stated he would rather go home and get pain medication.

## 2012-11-15 NOTE — Discharge Instructions (Signed)
Keep surgical site clean and dry. Do not remove the healing cap (white circular object).  Sedation or General Anesthesia, Adult Care After Refer to this sheet in the next 24 hours. These instructions provide you with information on caring for yourself after your procedure. Your caregiver may also give you more specific instructions. Your treatment has been planned according to current medical practices, but problems sometimes occur. Call your caregiver if you have any problems or questions after your procedure.  HOME CARE INSTRUCTIONS   Do not participate in any activities that require you to be alert or coordinated. Do not:  Drive.  Swim.  Ride a bicycle.  Operate heavy machinery.  Cook.  Use power tools.  Climb ladders.  Work at [employer].  Take a bath.  Do not drink alcohol.  Do not make any important decisions or sign legal documents.  Stay with an adult.  The first meal following your procedure should be light and small. Avoid solid foods if you feel sick to your stomach (nauseous) or if you throw up (vomit).  Drink enough fluids to keep your urine clear or pale yellow.  Only take your usual medicines or new medicines if your caregiver approves them.  Only take over-the-counter or prescription medicines for pain, discomfort, or fever as directed by your caregiver.  Keep all follow-up appointments as directed by your caregiver. SEEK IMMEDIATE MEDICAL CARE IF:   You are not feeling normal or behaving normally after 24 hours.  You have persistent nausea and vomiting.  You are unable to drink fluids or eat food.  You have difficulty urinating.  You have difficulty breathing or speaking.  You have blue or gray skin.  There is difficulty waking or you cannot be woken up.  You have heavy bleeding, redness, or a lot of swelling where the sedative or anesthesia entered your skin (intravenous site).  You have a rash. MAKE SURE YOU:  Understand these  instructions.  Will watch your condition.  Will get help right away if you are not doing well or get worse. Document Released: 05/23/2005 Document Revised: 11/22/2011 Document Reviewed: 09/21/2011 [facility] Patient Information 2014 [place], [place].

## 2012-11-15 NOTE — Anesthesia Preprocedure Evaluation (Addendum)
Anesthesia Evaluation  Patient identified by MRN, date of birth, ID band Patient awake    Reviewed: Allergy & Precautions, H&P , NPO status , Patient's Chart, lab work & pertinent test results, reviewed documented beta blocker date and time   History of Anesthesia Complications (+) PONV  Airway Mallampati: II TM Distance: >3 FB Neck ROM: full    Dental no notable dental hx. (+) Teeth Intact and Dental Advisory Given   Pulmonary shortness of breath and with exertion, asthma , COPD COPD inhaler, Current Smoker,  Mild emphysema breath sounds clear to auscultation  Pulmonary exam normal       Cardiovascular hypertension, Pt. on home beta blockers Rhythm:regular Rate:Normal     Neuro/Psych negative neurological ROS  negative psych ROS   GI/Hepatic negative GI ROS, Neg liver ROS, Medicated and Controlled,  Endo/Other  negative endocrine ROS  Renal/GU negative Renal ROS  negative genitourinary   Musculoskeletal   Abdominal   Peds  Hematology negative hematology ROS (+)   Anesthesia Other Findings   Reproductive/Obstetrics negative OB ROS                          Anesthesia Physical Anesthesia Plan  ASA: III  Anesthesia Plan: General   Post-op Pain Management:    Induction: Intravenous  Airway Management Planned: Oral ETT  Additional Equipment:   Intra-op Plan:   Post-operative Plan: Extubation in OR  Informed Consent: I have reviewed the patients History and Physical, chart, labs and discussed the procedure including the risks, benefits and alternatives for the proposed anesthesia with the patient or authorized representative who has indicated his/her understanding and acceptance.   Dental Advisory Given  Plan Discussed with: CRNA and Surgeon  Anesthesia Plan Comments:         Anesthesia Quick Evaluation

## 2012-11-15 NOTE — Preoperative (Signed)
Beta Blockers   Reason not to administer Beta Blockers:Not Applicable 

## 2012-11-20 ENCOUNTER — Encounter (HOSPITAL_COMMUNITY): Payer: Self-pay | Admitting: Otolaryngology

## 2013-01-22 ENCOUNTER — Ambulatory Visit (INDEPENDENT_AMBULATORY_CARE_PROVIDER_SITE_OTHER): Payer: Medicare Other | Admitting: Urology

## 2013-01-22 DIAGNOSIS — N401 Enlarged prostate with lower urinary tract symptoms: Secondary | ICD-10-CM

## 2013-01-22 DIAGNOSIS — N138 Other obstructive and reflux uropathy: Secondary | ICD-10-CM

## 2013-01-22 DIAGNOSIS — N289 Disorder of kidney and ureter, unspecified: Secondary | ICD-10-CM

## 2013-09-12 IMAGING — CR DG CHEST 2V
2 series · 2 of 2 positions shown · non-contrast
Comparison: 07/21/2010

CLINICAL DATA: Preoperative respiratory exam for cervical spine
surgery.  Hypertension.  Diabetes.  Smoking history.

CHEST - 2 VIEW

[view not recorded (1 of 2)]
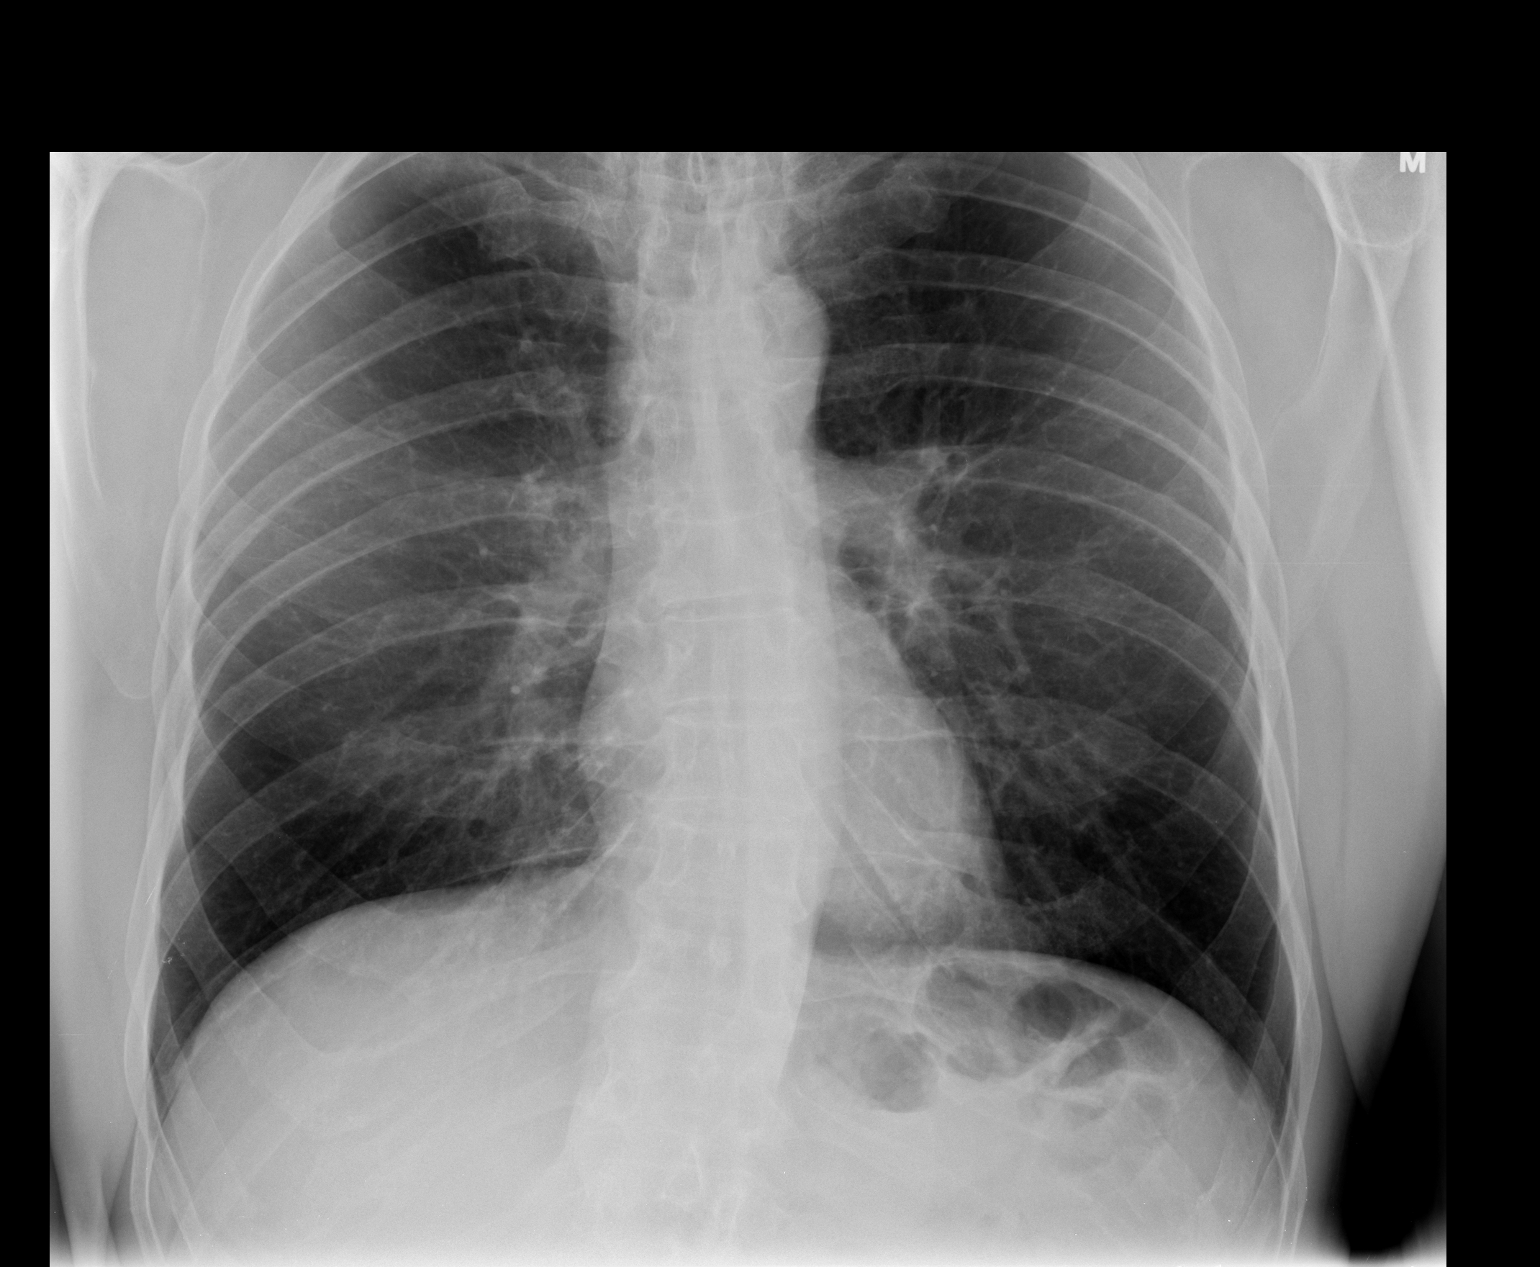

[view not recorded (2 of 2)]
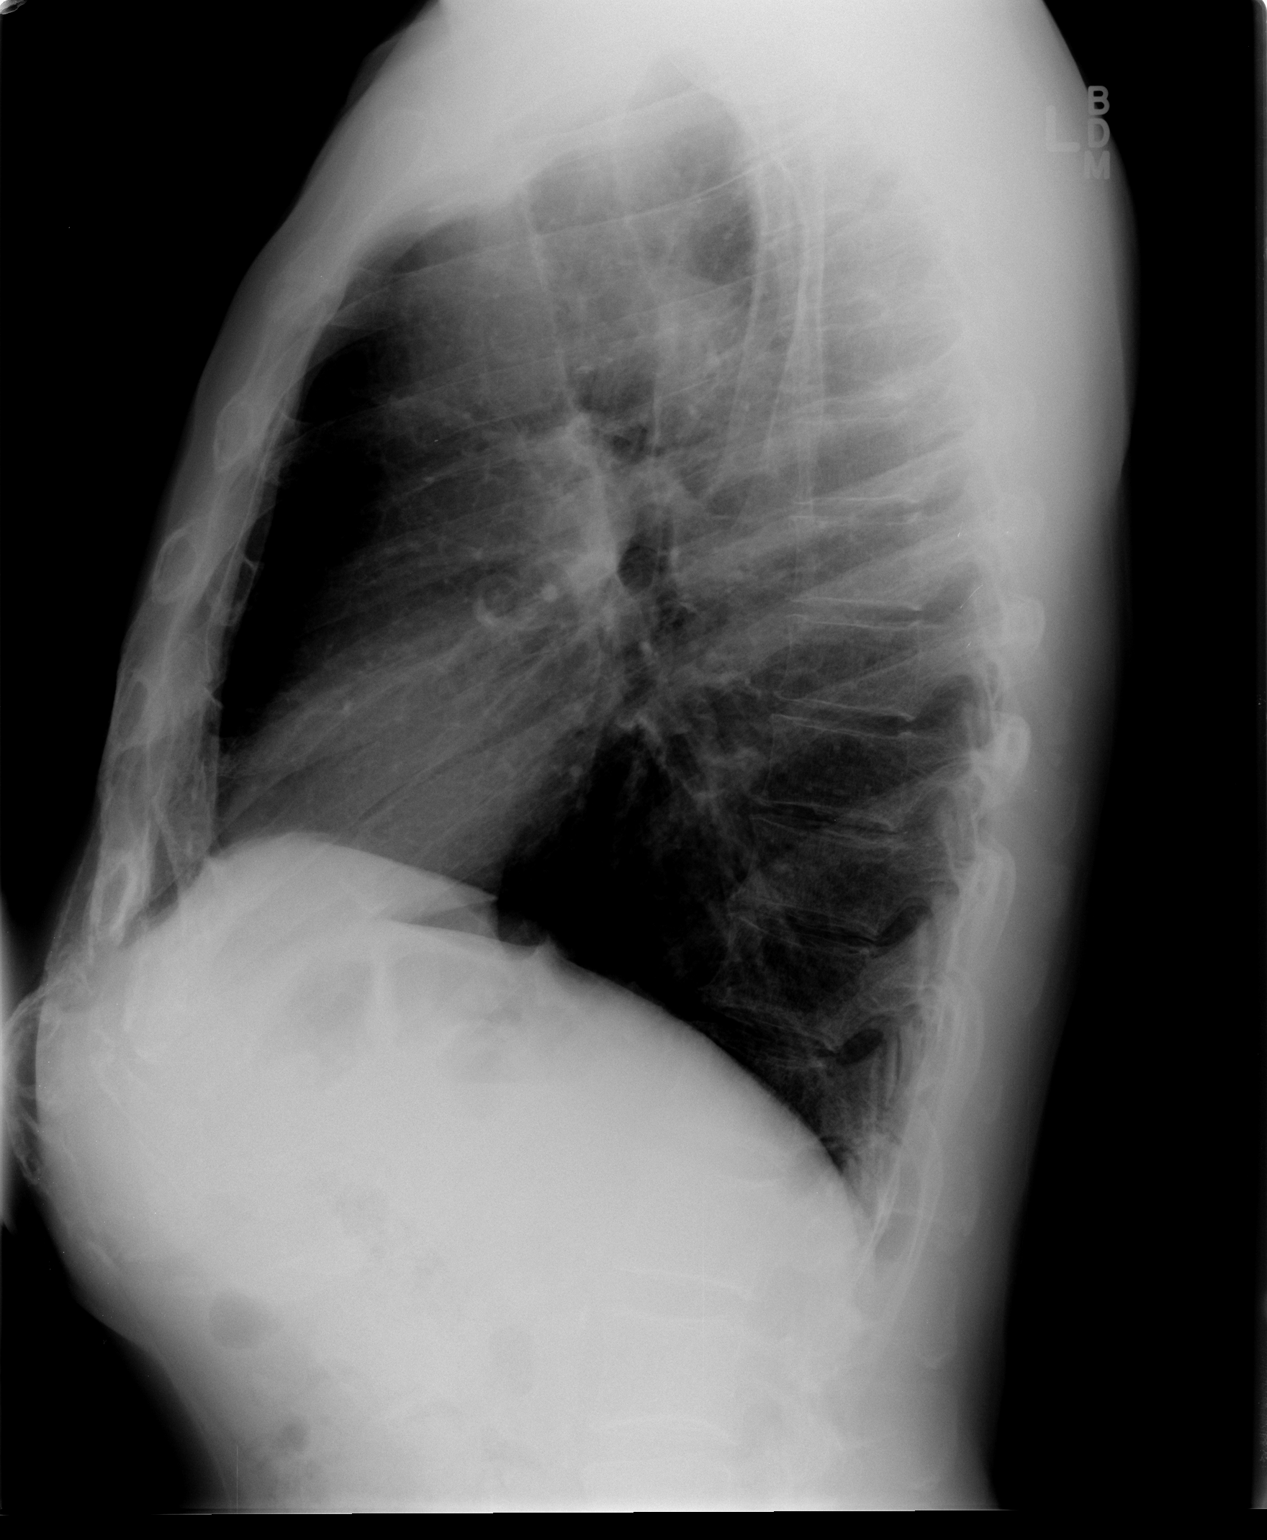

[2 of 2 positions shown; findings below may reference images not displayed]

FINDINGS: Heart size is normal.  Mediastinal shadows are normal.
The lungs appear hyperlucent suggesting emphysema.  No infiltrate,
mass, effusion or collapse.  No significant bony finding.
IMPRESSION: No active disease.  Suspected emphysema.

## 2013-09-27 ENCOUNTER — Emergency Department (HOSPITAL_COMMUNITY)
Admission: EM | Admit: 2013-09-27 | Discharge: 2013-09-27 | Disposition: A | Discharge status: Home / Self Care | Payer: Medicare Other | Attending: Emergency Medicine | Admitting: Emergency Medicine

## 2013-09-27 ENCOUNTER — Encounter (HOSPITAL_COMMUNITY): Payer: Self-pay | Admitting: Emergency Medicine

## 2013-09-27 ENCOUNTER — Emergency Department (HOSPITAL_COMMUNITY): Payer: Medicare Other

## 2013-09-27 DIAGNOSIS — W64XXXA Exposure to other animate mechanical forces, initial encounter: Secondary | ICD-10-CM | POA: Insufficient documentation

## 2013-09-27 DIAGNOSIS — Z8669 Personal history of other diseases of the nervous system and sense organs: Secondary | ICD-10-CM | POA: Insufficient documentation

## 2013-09-27 DIAGNOSIS — S199XXA Unspecified injury of neck, initial encounter: Secondary | ICD-10-CM

## 2013-09-27 DIAGNOSIS — G4700 Insomnia, unspecified: Secondary | ICD-10-CM | POA: Insufficient documentation

## 2013-09-27 DIAGNOSIS — Y929 Unspecified place or not applicable: Secondary | ICD-10-CM | POA: Insufficient documentation

## 2013-09-27 DIAGNOSIS — F3289 Other specified depressive episodes: Secondary | ICD-10-CM | POA: Insufficient documentation

## 2013-09-27 DIAGNOSIS — M25559 Pain in unspecified hip: Secondary | ICD-10-CM | POA: Insufficient documentation

## 2013-09-27 DIAGNOSIS — N429 Disorder of prostate, unspecified: Secondary | ICD-10-CM | POA: Insufficient documentation

## 2013-09-27 DIAGNOSIS — J45901 Unspecified asthma with (acute) exacerbation: Secondary | ICD-10-CM | POA: Insufficient documentation

## 2013-09-27 DIAGNOSIS — S7000XA Contusion of unspecified hip, initial encounter: Secondary | ICD-10-CM | POA: Insufficient documentation

## 2013-09-27 DIAGNOSIS — F329 Major depressive disorder, single episode, unspecified: Secondary | ICD-10-CM | POA: Insufficient documentation

## 2013-09-27 DIAGNOSIS — F17200 Nicotine dependence, unspecified, uncomplicated: Secondary | ICD-10-CM | POA: Insufficient documentation

## 2013-09-27 DIAGNOSIS — K219 Gastro-esophageal reflux disease without esophagitis: Secondary | ICD-10-CM | POA: Insufficient documentation

## 2013-09-27 DIAGNOSIS — Z79899 Other long term (current) drug therapy: Secondary | ICD-10-CM | POA: Insufficient documentation

## 2013-09-27 DIAGNOSIS — S7002XA Contusion of left hip, initial encounter: Secondary | ICD-10-CM

## 2013-09-27 DIAGNOSIS — S0993XA Unspecified injury of face, initial encounter: Secondary | ICD-10-CM | POA: Insufficient documentation

## 2013-09-27 DIAGNOSIS — I10 Essential (primary) hypertension: Secondary | ICD-10-CM | POA: Insufficient documentation

## 2013-09-27 DIAGNOSIS — F411 Generalized anxiety disorder: Secondary | ICD-10-CM | POA: Insufficient documentation

## 2013-09-27 DIAGNOSIS — Y9389 Activity, other specified: Secondary | ICD-10-CM | POA: Insufficient documentation

## 2013-09-27 DIAGNOSIS — J438 Other emphysema: Secondary | ICD-10-CM | POA: Insufficient documentation

## 2013-09-27 MED ORDER — OXYCODONE-ACETAMINOPHEN 5-325 MG PO TABS: 1.0000 | ORAL_TABLET | Freq: Four times a day (QID) | ORAL | Status: DC | PRN

## 2013-09-27 MED ORDER — HYDROMORPHONE HCL PF 1 MG/ML IJ SOLN
1.0000 mg | Freq: Once | INTRAMUSCULAR | Status: AC
  Administered 2013-09-27: 1 mg via INTRAMUSCULAR
  Filled 2013-09-27: qty 1

## 2013-09-27 MED ORDER — IBUPROFEN 800 MG PO TABS: 800.0000 mg | ORAL_TABLET | Freq: Three times a day (TID) | ORAL | Status: DC

## 2013-09-27 MED ORDER — ONDANSETRON HCL 4 MG PO TABS
4.0000 mg | ORAL_TABLET | Freq: Once | ORAL | Status: AC
  Administered 2013-09-27: 4 mg via ORAL
  Filled 2013-09-27: qty 1

## 2013-09-27 NOTE — Discharge Instructions (Signed)
Your x-ray and CT scan are negative for fracture of your pelvis, and hips. You have a hematoma (deep bruise) of the left hip. Please apply ice, please use crutches to take some of the weight off of that hip until you can safely apply weight to the hip. Please use ibuprofen 3 times daily for inflammation until the pain and hematoma have improved. Please take this medication with food. Please use Percocet every 6 hours if needed for pain. This medication may cause drowsiness, and/or constipation. Please use with caution. Please see your primary physician for additional evaluation and management if not improving. Hematoma A hematoma is a collection of blood. The collection of blood can turn into a hard, painful lump under the skin. Your skin may turn blue or yellow if the hematoma is close to the surface of the skin. Most hematomas get better in a few days to weeks. Some hematomas are serious and need medical care. Hematomas can be very small or very big. HOME CARE  Apply ice to the injured area:  Put ice in a plastic bag.  Place a towel between your skin and the bag.  Leave the ice on for 20 minutes, 2 3 times a day for the first 1 to 2 days.  After the first 2 days, switch to using warm packs on the injured area.  Raise (elevate) the injured area to lessen pain and puffiness (swelling). You may also wrap the area with an elastic bandage. Make sure the bandage is not wrapped too tight.  If you have a painful hematoma on your leg or foot, you may use crutches for a couple days.  Only take medicines as told by your doctor. GET HELP RIGHT AWAY IF:   Your pain gets worse.  Your pain is not controlled with medicine.  You have a fever.  Your puffiness gets worse.  Your skin turns more blue or yellow.  Your skin over the hematoma breaks or starts bleeding.  Your hematoma is in your chest or belly (abdomen) and you are short of breath, feel weak, or have a change in consciousness.  Your  hematoma is on your scalp and you have a headache that gets worse or a change in alertness or consciousness. MAKE SURE YOU:   Understand these instructions.  Will watch your condition.  Will get help right away if you are not doing well or get worse. Document Released: 06/30/2004 Document Revised: 01/23/2013 Document Reviewed: 10/31/2012 [facility] Patient Information 2014 [place].  Contusion A contusion is a deep bruise. Contusions happen when an injury causes bleeding under the skin. Signs of bruising include pain, puffiness (swelling), and discolored skin. The contusion may turn blue, purple, or yellow. HOME CARE   Put ice on the injured area.  Put ice in a plastic bag.  Place a towel between your skin and the bag.  Leave the ice on for 15-20 minutes, 03-04 times a day.  Only take medicine as told by your doctor.  Rest the injured area.  If possible, raise (elevate) the injured area to lessen puffiness. GET HELP RIGHT AWAY IF:   You have more bruising or puffiness.  You have pain that is getting worse.  Your puffiness or pain is not helped by medicine. MAKE SURE YOU:   Understand these instructions.  Will watch your condition.  Will get help right away if you are not doing well or get worse. Document Released: 11/09/2007 Document Revised: 08/15/2011 Document Reviewed: 03/28/2011 [employer] Patient Information 2014  [employer].

## 2013-09-27 NOTE — ED Notes (Signed)
Pt refused crutches. Stated he has two pair at home and has agreed to use them.

## 2013-09-27 NOTE — ED Notes (Signed)
Patient c/o left hip pain. Per patient knocked ground by horse, hitting left hip. Denies hitting head or LOC. Per patient large amount of swelling to left hip. No rotation or shortening noted. Patient ambulated to triage.

## 2013-09-27 NOTE — ED Notes (Signed)
Ice bag applied to pt's L. Hip area.

## 2013-09-27 NOTE — ED Provider Notes (Signed)
CSN: [id number]     Arrival date & time 09/27/13  1656 History   First MD Initiated Contact with Patient 09/27/13 1711     Chief Complaint  Patient presents with  . Hip Pain     (Consider location/radiation/quality/duration/timing/severity/associated sxs/prior Treatment) HPI Comments: Patient is a 63 year old male who presents to the emergency department with a complaint of left hip pain. The patient states that his hoarse pushed him down with her head. He fell on the left hip. He denies hitting his head. The patient denies hurting his neck or shoulder. He denies any rib area pain or abdomen pain. The patient and his wife noticed significant swelling shortly after the injury. They also noticed a great deal of pain. The patient states he's been able to ambulate but with great difficulty. The patient denies being on any anticoagulation medications. He denies any bleeding disorders. He denies any other injuries at this time. He has not taken any medication for this injury.  Patient is a 63 y.o. male presenting with hip pain. The history is provided by the patient and the spouse.  Hip Pain This is a new problem. The current episode started today. Associated symptoms include arthralgias and neck pain. Pertinent negatives include no abdominal pain, chest pain or coughing.    Past Medical History  Diagnosis Date  . Asthma   . Mild emphysema   . Anxiety   . Depression   . Insomnia   . Prostate disorder   . Hypertension   . Neuropathy   . Headache(784.0)     MIGRAINE  --  LAST ONE ACOUPLE DAYS AGO  . PONV (postoperative nausea and vomiting)   . Shortness of breath     with exertion  . GERD (gastroesophageal reflux disease)   . Neuromuscular disorder     neuropathy   Past Surgical History  Procedure Laterality Date  . Neck surgery      x 3  . Elbow surgery      x 2 on both  . Knee surgery      x 2 both knees  . Wrist surgery      left  . Inner ear surgery      x 2 on left  .  Mastoidectomy      left  . Eye surgery      BIL  CATARACTS  . Bone anchored hearing aid implant Left 11/15/2012    Procedure: LEFT BONE ANCHORED HEARING AID (BAHA) IMPLANT;  Surgeon: Izora Gala, MD;  Location: [facility];  Service: ENT;  Laterality: Left;   History reviewed. No pertinent family history. History  Substance Use Topics  . Smoking status: Current Every Day Smoker -- 0.50 packs/day for 38 years    Types: Cigarettes  . Smokeless tobacco: Never Used  . Alcohol Use: No    Review of Systems  Constitutional: Negative for activity change.       All ROS Neg except as noted in HPI  HENT: Negative for nosebleeds.   Eyes: Negative for photophobia and discharge.  Respiratory: Positive for shortness of breath. Negative for cough and wheezing.   Cardiovascular: Negative for chest pain and palpitations.  Gastrointestinal: Negative for abdominal pain and blood in stool.  Genitourinary: Negative for dysuria, frequency and hematuria.  Musculoskeletal: Positive for arthralgias and neck pain. Negative for back pain.  Skin: Negative.   Neurological: Negative for dizziness, seizures and speech difficulty.  Psychiatric/Behavioral: Negative for hallucinations and confusion. The patient is nervous/anxious.  Allergies  Review of patient's allergies indicates no known allergies.  Home Medications   Prior to Admission medications   Medication Sig Start Date End Date Taking? Authorizing Provider  albuterol (PROVENTIL HFA) 108 (90 BASE) MCG/ACT inhaler Inhale 2 puffs into the lungs daily as needed for wheezing or shortness of breath.     Historical Provider, MD  albuterol (PROVENTIL) (2.5 MG/3ML) 0.083% nebulizer solution Take 2.5 mg by nebulization daily as needed for wheezing or shortness of breath. Shortness of breathe    Historical Provider, MD  amitriptyline (ELAVIL) 100 MG tablet Take 100 mg by mouth at bedtime.     Historical Provider, MD  cephALEXin (KEFLEX) 500 MG capsule Take 1  capsule (500 mg total) by mouth 3 (three) times daily. 11/15/12   Izora Gala, MD  gabapentin (NEURONTIN) 300 MG capsule Take 600 mg by mouth 3 (three) times daily.     Historical Provider, MD  HYDROcodone-acetaminophen (NORCO) 7.5-325 MG per tablet Take 1 tablet by mouth 2 (two) times daily as needed for pain.    Historical Provider, MD  HYDROcodone-acetaminophen (NORCO) 7.5-325 MG per tablet Take 1 tablet by mouth every 6 (six) hours as needed for pain. 11/15/12   Izora Gala, MD  ibuprofen (ADVIL,MOTRIN) 200 MG tablet Take 400 mg by mouth 3 (three) times daily as needed for pain.     Historical Provider, MD  metoprolol (LOPRESSOR) 100 MG tablet Take 100 mg by mouth 2 (two) times daily.    Historical Provider, MD  Multiple Vitamin (MULTIVITAMIN WITH MINERALS) TABS Take 1 tablet by mouth daily.    Historical Provider, MD  nitroGLYCERIN (NITROSTAT) 0.4 MG SL tablet Place 0.4 mg under the tongue every 5 (five) minutes as needed. Chest pain    Historical Provider, MD  omeprazole (PRILOSEC OTC) 20 MG tablet Take 20 mg by mouth 2 (two) times daily.    Historical Provider, MD  pravastatin (PRAVACHOL) 40 MG tablet Take 40 mg by mouth at bedtime.    Historical Provider, MD  promethazine (PHENERGAN) 25 MG suppository Place 1 suppository (25 mg total) rectally every 6 (six) hours as needed for nausea. 11/15/12   Izora Gala, MD  SUMAtriptan (IMITREX) 50 MG tablet Take 50 mg by mouth every 2 (two) hours as needed for migraine.    Historical Provider, MD  Tamsulosin HCl (FLOMAX) 0.4 MG CAPS Take 0.8 mg by mouth at bedtime.     Historical Provider, MD  zolpidem (AMBIEN) 10 MG tablet Take 10 mg by mouth at bedtime. sleep    Historical Provider, MD   BP 136/87  Pulse 93  Temp(Src) 98.2 F (36.8 C) (Oral)  Resp 16  Ht 5\' 11"  (1.803 m)  Wt 160 lb (72.576 kg)  BMI 22.33 kg/m2  SpO2 99% Physical Exam  Nursing note and vitals reviewed. Constitutional: He is oriented to person, place, and time. He appears  well-developed and well-nourished.  Non-toxic appearance.  HENT:  Head: Normocephalic.  Right Ear: Tympanic membrane and external ear normal.  Left Ear: Tympanic membrane and external ear normal.  Normocephalic and atraumatic, no evidence of injury to the head.  Eyes: EOM and lids are normal. Pupils are equal, round, and reactive to light.  Neck: Normal range of motion. Neck supple. Carotid bruit is not present.  Cardiovascular: Normal rate, regular rhythm, normal heart sounds, intact distal pulses and normal pulses.   Pulmonary/Chest: Breath sounds normal. No respiratory distress.  I  Abdominal: Soft. Bowel sounds are normal. There is no tenderness. There  is no guarding.  Musculoskeletal: Normal range of motion.  There is pain to movement of the left hip. There is a 12.3 x 10.2 cm hematoma of the left hip. There is no deformity of the femur. There is full range of motion of the knee and ankle. There is no deformity of the tibia or fibula area. The dorsalis pedis pulses 2+. There is full range of motion of the right hip and right lower extremity without problem.  Lymphadenopathy:       Head (right side): No submandibular adenopathy present.       Head (left side): No submandibular adenopathy present.    He has no cervical adenopathy.  Neurological: He is alert and oriented to person, place, and time. He has normal strength. No cranial nerve deficit or sensory deficit. He exhibits normal muscle tone. Coordination normal.  Patient is ambulatory but slow and with pain.  Skin: Skin is warm and dry.  Psychiatric: He has a normal mood and affect. His speech is normal.    ED Course  Procedures (including critical care time) Labs Review Labs Reviewed - No data to display  Imaging Review No results found.   EKG Interpretation None      MDM The patient sustained left hip pain and hematoma after a fall. X-ray of the left hip is negative for fracture or dislocation. Because of the continued  severity of pain in the hematoma, a CT scan was obtained to rule out an occult fracture or other problems. The CT scan of the pelvis reveals a subcutaneous hematoma over the left hip. It is negative for fracture or dislocation.  The patient is advised to apply ice, to rest his hip is much as possible. Prescription for Motrin and Percocet given to the patient. Patient was also fitted with crutches. Patient is to follow with his primary physician, or return to the emergency department if any changes, problems, or concerns.    Final diagnoses:  None    *I have reviewed nursing notes, vital signs, and all appropriate lab and imaging results for this patient.Lenox Ahr, PA-C 09/28/13 [phone]

## 2013-10-02 NOTE — ED Provider Notes (Signed)
Medical screening examination/treatment/procedure(s) were performed by non-physician practitioner and as supervising physician I was immediately available for consultation/collaboration.   EKG Interpretation None      Rolland Porter, MD, Abram Sander   Janice Norrie, MD 10/02/13 [phone]

## 2014-01-23 DIAGNOSIS — J449 Chronic obstructive pulmonary disease, unspecified: Secondary | ICD-10-CM | POA: Insufficient documentation

## 2014-01-23 DIAGNOSIS — G43909 Migraine, unspecified, not intractable, without status migrainosus: Secondary | ICD-10-CM | POA: Insufficient documentation

## 2014-01-23 DIAGNOSIS — Q2112 Patent foramen ovale: Secondary | ICD-10-CM | POA: Insufficient documentation

## 2014-02-10 ENCOUNTER — Emergency Department (HOSPITAL_COMMUNITY)
Admission: EM | Admit: 2014-02-10 | Discharge: 2014-02-10 | Disposition: A | Discharge status: Home / Self Care | Payer: Medicare Other | Attending: Emergency Medicine | Admitting: Emergency Medicine

## 2014-02-10 ENCOUNTER — Emergency Department (HOSPITAL_COMMUNITY): Payer: Medicare Other

## 2014-02-10 ENCOUNTER — Encounter (HOSPITAL_COMMUNITY): Payer: Self-pay | Admitting: Emergency Medicine

## 2014-02-10 DIAGNOSIS — S72413A Displaced unspecified condyle fracture of lower end of unspecified femur, initial encounter for closed fracture: Secondary | ICD-10-CM | POA: Diagnosis not present

## 2014-02-10 DIAGNOSIS — J438 Other emphysema: Secondary | ICD-10-CM | POA: Insufficient documentation

## 2014-02-10 DIAGNOSIS — Z792 Long term (current) use of antibiotics: Secondary | ICD-10-CM | POA: Diagnosis not present

## 2014-02-10 DIAGNOSIS — G589 Mononeuropathy, unspecified: Secondary | ICD-10-CM | POA: Diagnosis not present

## 2014-02-10 DIAGNOSIS — I10 Essential (primary) hypertension: Secondary | ICD-10-CM | POA: Insufficient documentation

## 2014-02-10 DIAGNOSIS — F411 Generalized anxiety disorder: Secondary | ICD-10-CM | POA: Diagnosis not present

## 2014-02-10 DIAGNOSIS — S8990XA Unspecified injury of unspecified lower leg, initial encounter: Secondary | ICD-10-CM | POA: Insufficient documentation

## 2014-02-10 DIAGNOSIS — Z791 Long term (current) use of non-steroidal anti-inflammatories (NSAID): Secondary | ICD-10-CM | POA: Diagnosis not present

## 2014-02-10 DIAGNOSIS — S8000XA Contusion of unspecified knee, initial encounter: Secondary | ICD-10-CM | POA: Insufficient documentation

## 2014-02-10 DIAGNOSIS — S99929A Unspecified injury of unspecified foot, initial encounter: Secondary | ICD-10-CM | POA: Diagnosis present

## 2014-02-10 DIAGNOSIS — F3289 Other specified depressive episodes: Secondary | ICD-10-CM | POA: Diagnosis not present

## 2014-02-10 DIAGNOSIS — F17200 Nicotine dependence, unspecified, uncomplicated: Secondary | ICD-10-CM | POA: Diagnosis not present

## 2014-02-10 DIAGNOSIS — K219 Gastro-esophageal reflux disease without esophagitis: Secondary | ICD-10-CM | POA: Insufficient documentation

## 2014-02-10 DIAGNOSIS — S99919A Unspecified injury of unspecified ankle, initial encounter: Secondary | ICD-10-CM

## 2014-02-10 DIAGNOSIS — G4700 Insomnia, unspecified: Secondary | ICD-10-CM | POA: Insufficient documentation

## 2014-02-10 DIAGNOSIS — F329 Major depressive disorder, single episode, unspecified: Secondary | ICD-10-CM | POA: Insufficient documentation

## 2014-02-10 DIAGNOSIS — Z87448 Personal history of other diseases of urinary system: Secondary | ICD-10-CM | POA: Insufficient documentation

## 2014-02-10 DIAGNOSIS — Y9289 Other specified places as the place of occurrence of the external cause: Secondary | ICD-10-CM | POA: Diagnosis not present

## 2014-02-10 DIAGNOSIS — Z79899 Other long term (current) drug therapy: Secondary | ICD-10-CM | POA: Diagnosis not present

## 2014-02-10 DIAGNOSIS — J45909 Unspecified asthma, uncomplicated: Secondary | ICD-10-CM | POA: Insufficient documentation

## 2014-02-10 DIAGNOSIS — Z9889 Other specified postprocedural states: Secondary | ICD-10-CM | POA: Insufficient documentation

## 2014-02-10 DIAGNOSIS — Y9389 Activity, other specified: Secondary | ICD-10-CM | POA: Insufficient documentation

## 2014-02-10 DIAGNOSIS — W64XXXA Exposure to other animate mechanical forces, initial encounter: Secondary | ICD-10-CM | POA: Insufficient documentation

## 2014-02-10 DIAGNOSIS — S72412A Displaced unspecified condyle fracture of lower end of left femur, initial encounter for closed fracture: Secondary | ICD-10-CM

## 2014-02-10 MED ORDER — ONDANSETRON HCL 4 MG PO TABS
4.0000 mg | ORAL_TABLET | Freq: Once | ORAL | Status: AC
  Administered 2014-02-10: 4 mg via ORAL
  Filled 2014-02-10: qty 1

## 2014-02-10 MED ORDER — KETOROLAC TROMETHAMINE 10 MG PO TABS
10.0000 mg | ORAL_TABLET | Freq: Once | ORAL | Status: AC
  Administered 2014-02-10: 10 mg via ORAL
  Filled 2014-02-10: qty 1

## 2014-02-10 MED ORDER — MELOXICAM 15 MG PO TABS: 15.0000 mg | ORAL_TABLET | Freq: Every day | ORAL | Status: DC

## 2014-02-10 MED ORDER — HYDROMORPHONE HCL PF 1 MG/ML IJ SOLN
1.0000 mg | Freq: Once | INTRAMUSCULAR | Status: AC
  Administered 2014-02-10: 1 mg via INTRAMUSCULAR
  Filled 2014-02-10: qty 1

## 2014-02-10 MED ORDER — OXYCODONE-ACETAMINOPHEN 5-325 MG PO TABS: 1.0000 | ORAL_TABLET | Freq: Four times a day (QID) | ORAL | Status: DC | PRN

## 2014-02-10 NOTE — Discharge Instructions (Signed)
Your test revealed a nondisplaced fracture of the left knee. Please your knee elevated above your waist. Please apply ice. Please use knee immobilizer and crutches until seen by orthopedic specialist. Please use Mobic daily with food until all taken. May use Percocet for pain if needed. Percocet may cause drowsiness, and/or constipation. Please use with caution.

## 2014-02-10 NOTE — ED Provider Notes (Signed)
CSN: [id number]     Arrival date & time 02/10/14  1654 History   First MD Initiated Contact with Patient 02/10/14 1715   This chart was scribed for non-physician practitioner Lily Kocher, PA-C, working with Dorie Rank, MD by Rosary Lively, ED scribe. This patient was seen in room APFT23/APFT23 and the patient's care was started at 5:55 PM.    Chief Complaint  Patient presents with  . Knee Injury   The history is provided by the patient. No language interpreter was used.   HPI Comments:  Terry Morrow is a 63 y.o. male who presents to the Emergency Department complaining of a left knee injury. Pt reports that a horse kicked him at approximately 11:00AM. Pt denies taking blood thinners. Pt had orthoscopic surgeries performed approximately 10 years ago. Orthopedist is Dr. Lorin Mercy in [place].   Past Medical History  Diagnosis Date  . Asthma   . Mild emphysema   . Anxiety   . Depression   . Insomnia   . Prostate disorder   . Hypertension   . Neuropathy   . Headache(784.0)     MIGRAINE  --  LAST ONE ACOUPLE DAYS AGO  . PONV (postoperative nausea and vomiting)   . Shortness of breath     with exertion  . GERD (gastroesophageal reflux disease)   . Neuromuscular disorder     neuropathy   Past Surgical History  Procedure Laterality Date  . Neck surgery      x 3  . Elbow surgery      x 2 on both  . Knee surgery      x 2 both knees  . Wrist surgery      left  . Inner ear surgery      x 2 on left  . Mastoidectomy      left  . Eye surgery      BIL  CATARACTS  . Bone anchored hearing aid implant Left 11/15/2012    Procedure: LEFT BONE ANCHORED HEARING AID (BAHA) IMPLANT;  Surgeon: Izora Gala, MD;  Location: [facility];  Service: ENT;  Laterality: Left;   History reviewed. No pertinent family history. History  Substance Use Topics  . Smoking status: Current Every Day Smoker -- 0.50 packs/day for 38 years    Types: Cigarettes  . Smokeless tobacco: Never Used  . Alcohol Use: No     Review of Systems  Musculoskeletal: Positive for arthralgias, joint swelling and myalgias.  Hematological: Negative for adenopathy. Does not bruise/bleed easily.  All other systems reviewed and are negative.     Allergies  Review of patient's allergies indicates no known allergies.  Home Medications   Prior to Admission medications   Medication Sig Start Date End Date Taking? Authorizing Provider  albuterol (PROVENTIL HFA) 108 (90 BASE) MCG/ACT inhaler Inhale 2 puffs into the lungs daily as needed for wheezing or shortness of breath.     Historical Provider, MD  albuterol (PROVENTIL) (2.5 MG/3ML) 0.083% nebulizer solution Take 2.5 mg by nebulization daily as needed for wheezing or shortness of breath. Shortness of breathe    Historical Provider, MD  amitriptyline (ELAVIL) 100 MG tablet Take 100 mg by mouth at bedtime.     Historical Provider, MD  cephALEXin (KEFLEX) 500 MG capsule Take 1 capsule (500 mg total) by mouth 3 (three) times daily. 11/15/12   Izora Gala, MD  gabapentin (NEURONTIN) 300 MG capsule Take 600 mg by mouth 3 (three) times daily.     Historical Provider,  MD  HYDROcodone-acetaminophen (NORCO) 7.5-325 MG per tablet Take 1 tablet by mouth 2 (two) times daily as needed for pain.    Historical Provider, MD  HYDROcodone-acetaminophen (NORCO) 7.5-325 MG per tablet Take 1 tablet by mouth every 6 (six) hours as needed for pain. 11/15/12   Izora Gala, MD  ibuprofen (ADVIL,MOTRIN) 200 MG tablet Take 400 mg by mouth 3 (three) times daily as needed for pain.     Historical Provider, MD  ibuprofen (ADVIL,MOTRIN) 800 MG tablet Take 1 tablet (800 mg total) by mouth 3 (three) times daily. 09/27/13   Lenox Ahr, PA-C  metoprolol (LOPRESSOR) 100 MG tablet Take 100 mg by mouth 2 (two) times daily.    Historical Provider, MD  Multiple Vitamin (MULTIVITAMIN WITH MINERALS) TABS Take 1 tablet by mouth daily.    Historical Provider, MD  nitroGLYCERIN (NITROSTAT) 0.4 MG SL tablet Place  0.4 mg under the tongue every 5 (five) minutes as needed. Chest pain    Historical Provider, MD  omeprazole (PRILOSEC OTC) 20 MG tablet Take 20 mg by mouth 2 (two) times daily.    Historical Provider, MD  oxyCODONE-acetaminophen (PERCOCET/ROXICET) 5-325 MG per tablet Take 1 tablet by mouth every 6 (six) hours as needed for severe pain. 09/27/13   Lenox Ahr, PA-C  pravastatin (PRAVACHOL) 40 MG tablet Take 40 mg by mouth at bedtime.    Historical Provider, MD  promethazine (PHENERGAN) 25 MG suppository Place 1 suppository (25 mg total) rectally every 6 (six) hours as needed for nausea. 11/15/12   Izora Gala, MD  SUMAtriptan (IMITREX) 50 MG tablet Take 50 mg by mouth every 2 (two) hours as needed for migraine.    Historical Provider, MD  Tamsulosin HCl (FLOMAX) 0.4 MG CAPS Take 0.8 mg by mouth at bedtime.     Historical Provider, MD  zolpidem (AMBIEN) 10 MG tablet Take 10 mg by mouth at bedtime. sleep    Historical Provider, MD   BP 111/52  Pulse 96  Temp(Src) 98 F (36.7 C) (Oral)  Resp 18  Ht 5\' 11"  (1.803 m)  Wt 165 lb (74.844 kg)  BMI 23.02 kg/m2  SpO2 100% Physical Exam  Nursing note and vitals reviewed. Constitutional: He is oriented to person, place, and time. He appears well-developed and well-nourished.  HENT:  Head: Normocephalic and atraumatic.  Eyes: EOM are normal.  Neck: Normal range of motion. Neck supple.  Cardiovascular: Normal rate.   Pulmonary/Chest: Effort normal.  Smmentrical rise and fall of the chest. No rib tenderness.   Abdominal: Soft. Bowel sounds are normal. There is no tenderness.  Musculoskeletal:       Right hip: He exhibits tenderness (tenderness and mild swelling of the right pelvis, pain with ROM of right hip).       Right knee: He exhibits normal range of motion.       Left knee: He exhibits decreased range of motion, swelling and erythema. Tenderness found.  Bruise to the medial left knee with hematoma present. Patella midline. Tenderness to  palpation of the quadricept region. No Pain or deformity to tib/fib area. Achilles tendon intact.  Neurological: He is alert and oriented to person, place, and time.  Skin: Skin is warm and dry.  Psychiatric: He has a normal mood and affect. His behavior is normal.    ED Course  Procedures   FRACTURE CARE - LEFT FEMUR FRACTURE. 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 Patient states he had almost immediate swelling and extreme difficulty with attempting to stand. He did not have a second fall.  The fracture was explained to the patient using the x-ray images on computer. The patient was identified by arm band. Permission for the procedure is given by the patient. The patient is fitted with a knee immobilizer. After the knee immobilizer was placed. The dorsalis pedis and posterior tibial pulses are noted to be 2+. Crutches were offered to the patient, but refused because he has crutches at home. Prescription for pain medication given to the patient. DIAGNOSTIC STUDIES: Oxygen Saturation is 100% on RA, normal by my interpretation.  COORDINATION OF CARE: 6:05 PM-Discussed treatment plan which includes imaging of the right pelvis, cast of the left knee,  with pt at bedside and pt agreed to plan.  Labs Review Labs Reviewed - No data to display  Imaging Review No results found.   EKG Interpretation None      MDM Patient was kicked by horse in the left knee and the left hip area. Vital signs are within normal limits. Pulse oximetry is 100% on room air. Within normal limits by my interpretation. I have reviewed the x-rays. Patient has a nondisplaced fracture of the medial femoral condyle. X-ray of the pelvis is negative for fracture or dislocation. Patient fitted with a knee immobilizer. He has crutches at home. Ice pack given,  prescription for Percocet and Mobic 15 mg given to the patient. Patient is advised to rest his hip area and knee is much as possible, and to see Dr. Lorin Mercy this week for evaluation and management.    Final diagnoses:  None    **I have reviewed nursing notes, vital signs, and all appropriate lab and imaging results for this patient. **I personally performed the services described in this documentation, which was scribed in my presence. The recorded information has been reviewed and is accurate.    Lenox Ahr, PA-C 02/11/14 [phone]

## 2014-02-10 NOTE — ED Notes (Signed)
Pt reports was kicked in the knee by his horse today around noon. Moderate swelling noted to left knee. Pt reports increased pain with movement. No obvious deformity noted.

## 2014-02-11 NOTE — ED Provider Notes (Signed)
Medical screening examination/treatment/procedure(s) were performed by non-physician practitioner and as supervising physician I was immediately available for consultation/collaboration.     Dorie Rank, MD 02/11/14 [phone]

## 2014-04-15 ENCOUNTER — Ambulatory Visit (INDEPENDENT_AMBULATORY_CARE_PROVIDER_SITE_OTHER): Payer: Medicare Other | Admitting: Urology

## 2014-04-15 DIAGNOSIS — N401 Enlarged prostate with lower urinary tract symptoms: Secondary | ICD-10-CM

## 2015-04-28 ENCOUNTER — Ambulatory Visit (INDEPENDENT_AMBULATORY_CARE_PROVIDER_SITE_OTHER): Payer: Medicare Other | Admitting: Urology

## 2015-04-28 DIAGNOSIS — N401 Enlarged prostate with lower urinary tract symptoms: Secondary | ICD-10-CM

## 2015-08-24 DIAGNOSIS — G4701 Insomnia due to medical condition: Secondary | ICD-10-CM | POA: Diagnosis not present

## 2015-08-24 DIAGNOSIS — R251 Tremor, unspecified: Secondary | ICD-10-CM | POA: Diagnosis not present

## 2015-08-24 DIAGNOSIS — Z79899 Other long term (current) drug therapy: Secondary | ICD-10-CM | POA: Diagnosis not present

## 2015-08-24 DIAGNOSIS — G43719 Chronic migraine without aura, intractable, without status migrainosus: Secondary | ICD-10-CM | POA: Diagnosis not present

## 2015-09-15 DIAGNOSIS — Z79899 Other long term (current) drug therapy: Secondary | ICD-10-CM | POA: Diagnosis not present

## 2015-09-15 DIAGNOSIS — E559 Vitamin D deficiency, unspecified: Secondary | ICD-10-CM | POA: Diagnosis not present

## 2015-09-15 DIAGNOSIS — R5383 Other fatigue: Secondary | ICD-10-CM | POA: Diagnosis not present

## 2015-09-15 DIAGNOSIS — E538 Deficiency of other specified B group vitamins: Secondary | ICD-10-CM | POA: Diagnosis not present

## 2015-09-15 DIAGNOSIS — M818 Other osteoporosis without current pathological fracture: Secondary | ICD-10-CM | POA: Diagnosis not present

## 2015-09-15 IMAGING — CR DG KNEE COMPLETE 4+V*L*
4 series · 4 of 4 positions shown · non-contrast
Comparison: None.

CLINICAL DATA: Kicked by a horse with left knee pain and swelling
and medial abrasions.

EXAM:
LEFT KNEE - COMPLETE 4+ VIEW

[view not recorded (1 of 4)]
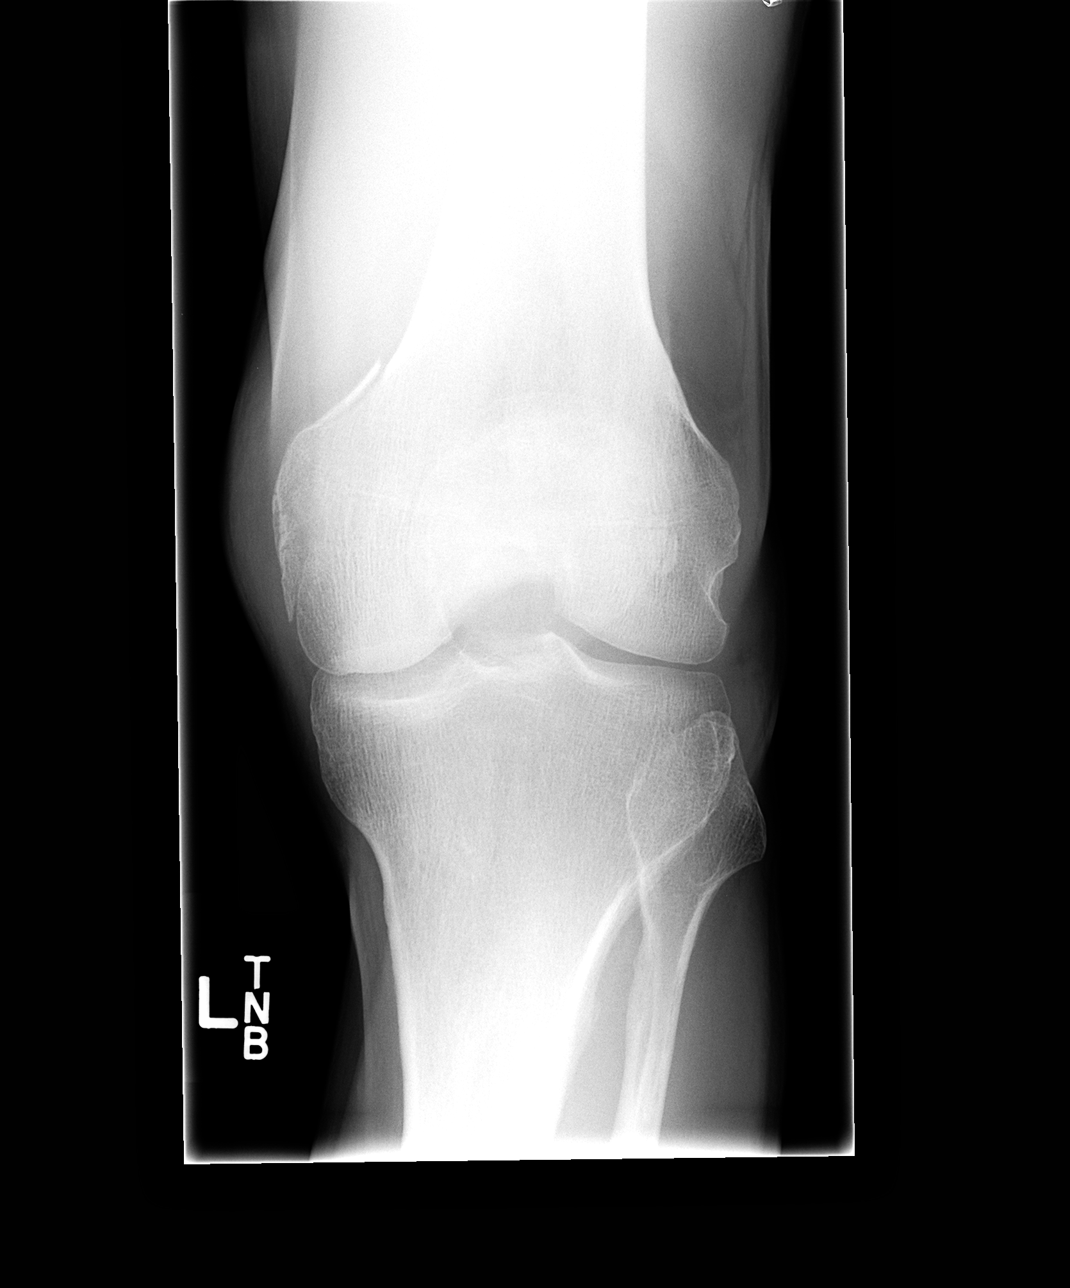

[view not recorded (2 of 4)]
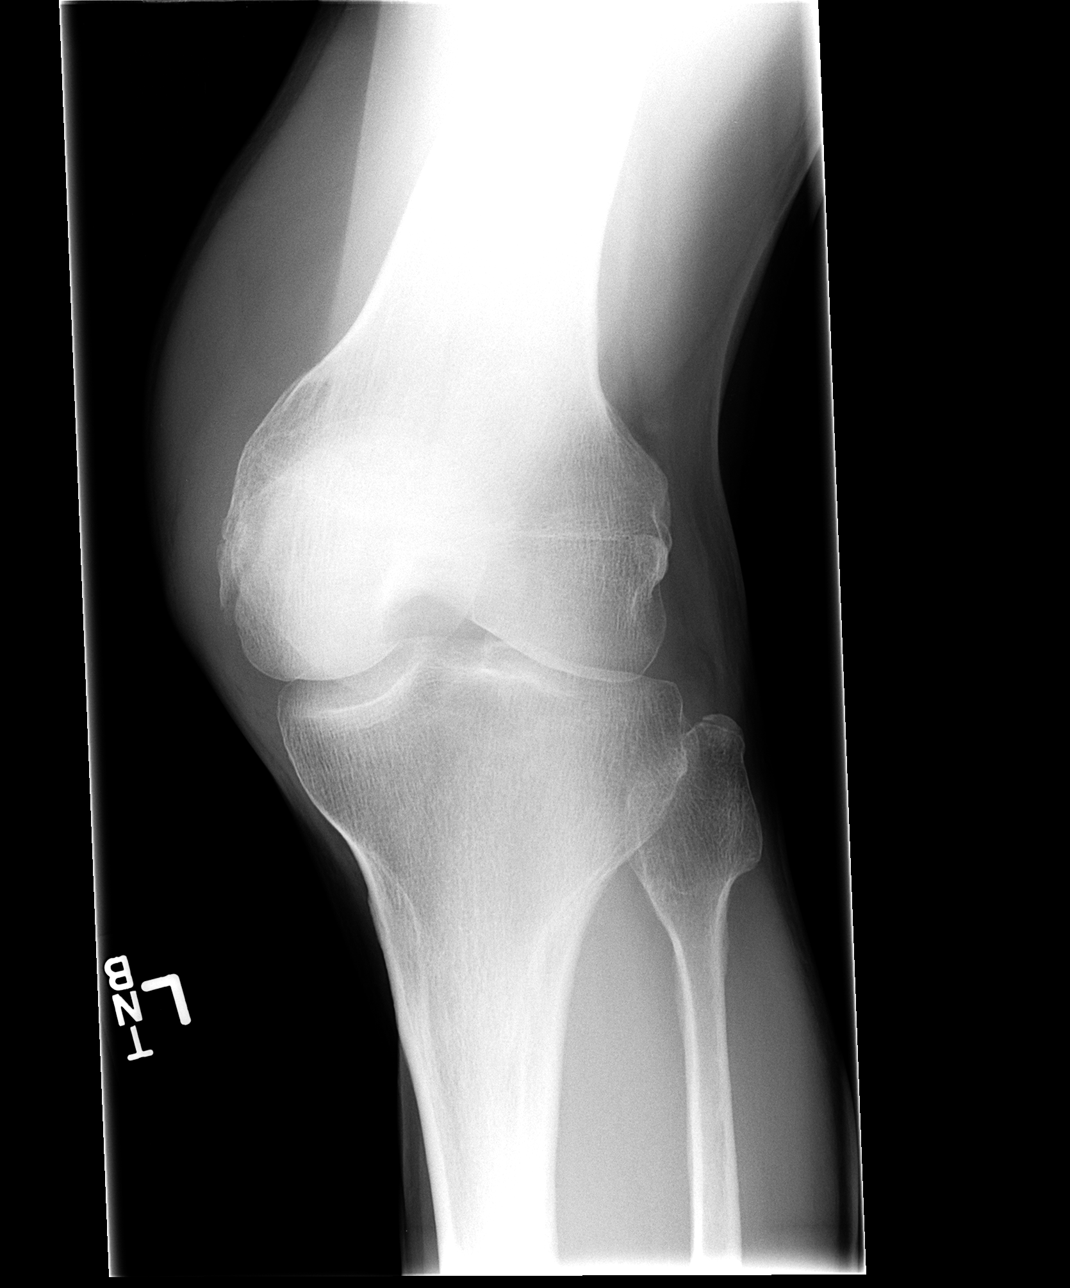

[view not recorded (3 of 4)]
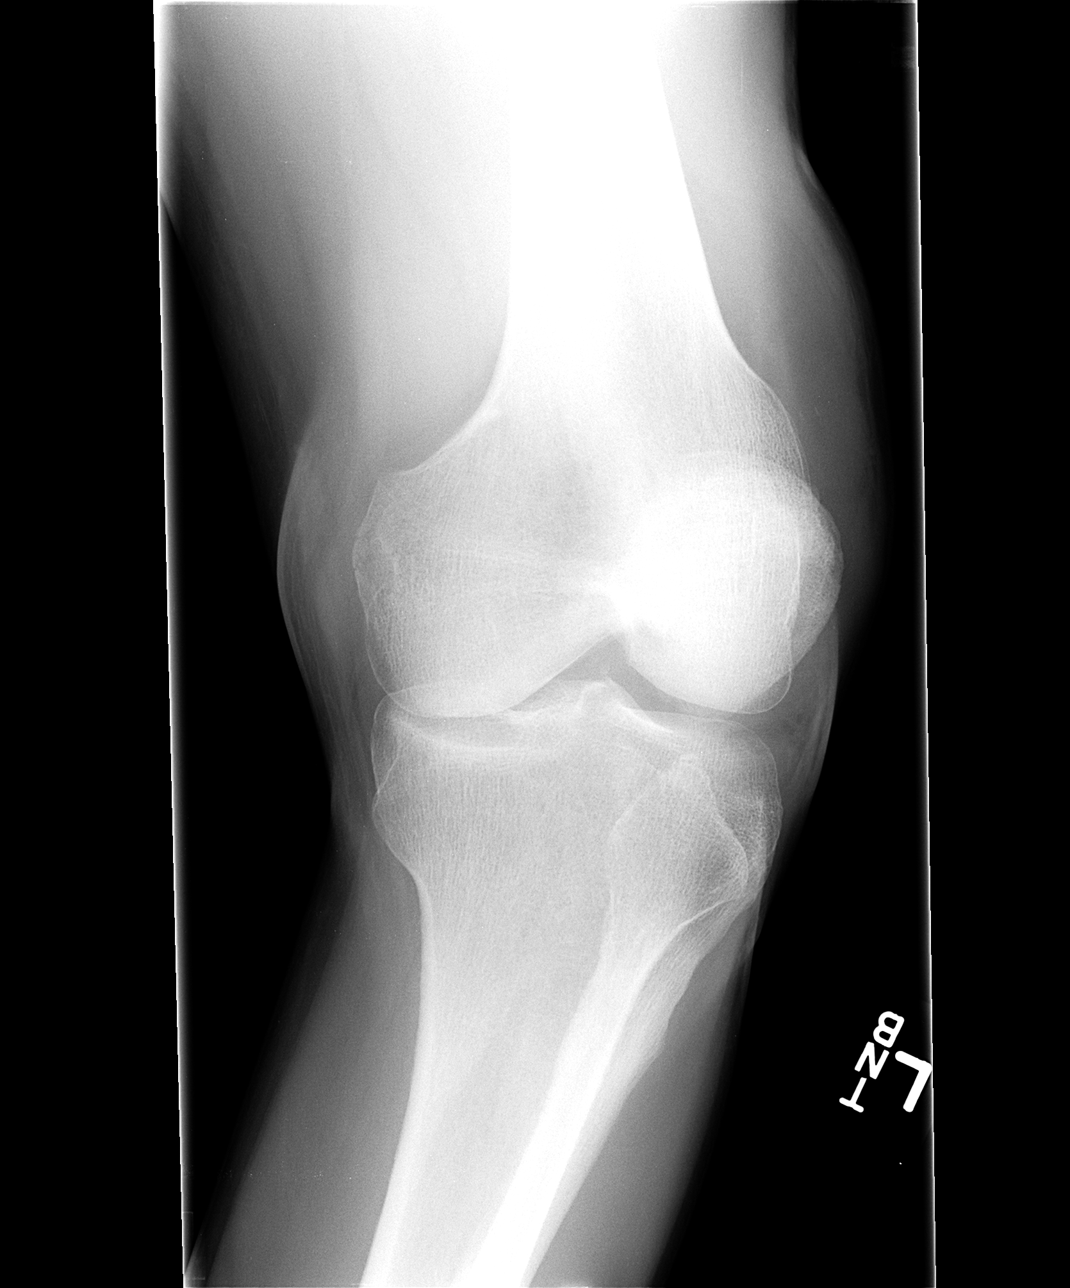

[view not recorded (4 of 4)]
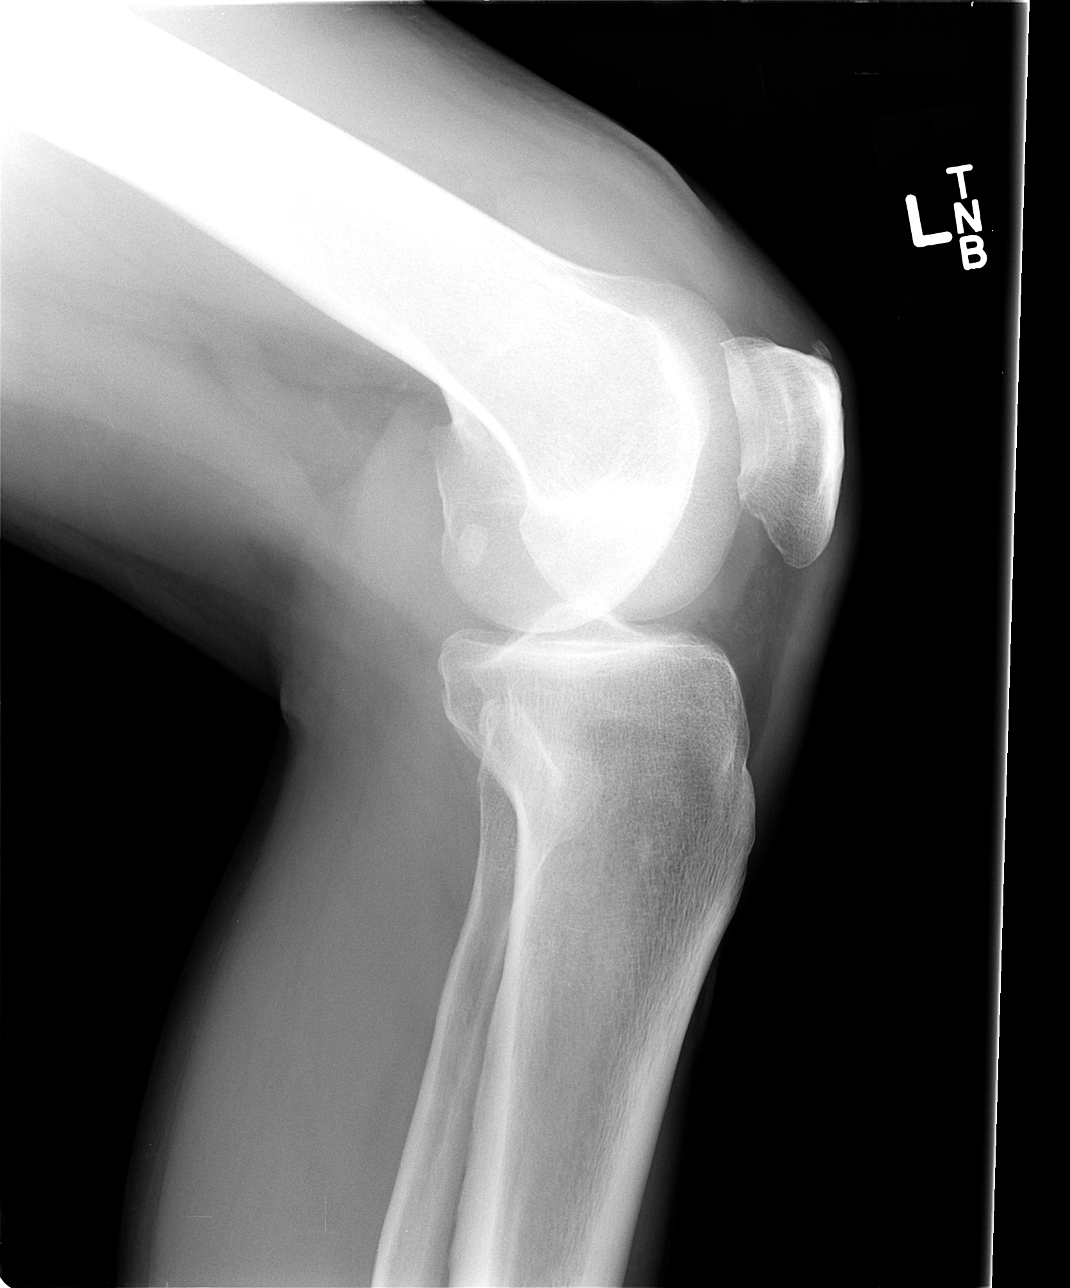

[4 of 4 positions shown; findings below may reference images not displayed]

FINDINGS: There is a nondisplaced fracture along the medial aspect of the
medial femoral condyle with overlying soft tissue swelling. Small
joint effusion. Trace patellofemoral osteophytosis.
IMPRESSION: Nondisplaced fracture along the medial margin of the medial femoral
condyle with overlying soft tissue swelling and small joint
effusion.

## 2015-11-04 DIAGNOSIS — A260 Cutaneous erysipeloid: Secondary | ICD-10-CM | POA: Diagnosis not present

## 2015-11-04 DIAGNOSIS — B999 Unspecified infectious disease: Secondary | ICD-10-CM | POA: Diagnosis not present

## 2015-11-04 DIAGNOSIS — Z1389 Encounter for screening for other disorder: Secondary | ICD-10-CM | POA: Diagnosis not present

## 2015-11-04 DIAGNOSIS — Z6824 Body mass index (BMI) 24.0-24.9, adult: Secondary | ICD-10-CM | POA: Diagnosis not present

## 2015-11-04 DIAGNOSIS — G894 Chronic pain syndrome: Secondary | ICD-10-CM | POA: Diagnosis not present

## 2015-12-23 DIAGNOSIS — M130 Polyarthritis, unspecified: Secondary | ICD-10-CM | POA: Diagnosis not present

## 2015-12-23 DIAGNOSIS — G43719 Chronic migraine without aura, intractable, without status migrainosus: Secondary | ICD-10-CM | POA: Diagnosis not present

## 2015-12-23 DIAGNOSIS — Z79899 Other long term (current) drug therapy: Secondary | ICD-10-CM | POA: Diagnosis not present

## 2015-12-23 DIAGNOSIS — R251 Tremor, unspecified: Secondary | ICD-10-CM | POA: Diagnosis not present

## 2015-12-23 DIAGNOSIS — G4701 Insomnia due to medical condition: Secondary | ICD-10-CM | POA: Diagnosis not present

## 2016-03-04 DIAGNOSIS — Z1389 Encounter for screening for other disorder: Secondary | ICD-10-CM | POA: Diagnosis not present

## 2016-03-04 DIAGNOSIS — Z0001 Encounter for general adult medical examination with abnormal findings: Secondary | ICD-10-CM | POA: Diagnosis not present

## 2016-03-04 DIAGNOSIS — I499 Cardiac arrhythmia, unspecified: Secondary | ICD-10-CM | POA: Diagnosis not present

## 2016-03-04 DIAGNOSIS — G8929 Other chronic pain: Secondary | ICD-10-CM | POA: Diagnosis not present

## 2016-03-04 DIAGNOSIS — Z6825 Body mass index (BMI) 25.0-25.9, adult: Secondary | ICD-10-CM | POA: Diagnosis not present

## 2016-03-07 ENCOUNTER — Other Ambulatory Visit (HOSPITAL_COMMUNITY): Payer: Self-pay | Admitting: Family Medicine

## 2016-03-07 DIAGNOSIS — I499 Cardiac arrhythmia, unspecified: Secondary | ICD-10-CM

## 2016-03-14 ENCOUNTER — Ambulatory Visit (HOSPITAL_COMMUNITY)
Admission: RE | Admit: 2016-03-14 | Discharge: 2016-03-14 | Disposition: A | Discharge status: Home / Self Care | Payer: Medicare Other | Source: Ambulatory Visit | Attending: Family Medicine | Admitting: Family Medicine

## 2016-03-14 ENCOUNTER — Other Ambulatory Visit (HOSPITAL_COMMUNITY): Payer: Self-pay | Admitting: Family Medicine

## 2016-03-14 DIAGNOSIS — I499 Cardiac arrhythmia, unspecified: Secondary | ICD-10-CM | POA: Diagnosis not present

## 2016-03-14 DIAGNOSIS — J209 Acute bronchitis, unspecified: Secondary | ICD-10-CM

## 2016-03-14 DIAGNOSIS — J449 Chronic obstructive pulmonary disease, unspecified: Secondary | ICD-10-CM | POA: Diagnosis not present

## 2016-03-14 DIAGNOSIS — Z6824 Body mass index (BMI) 24.0-24.9, adult: Secondary | ICD-10-CM | POA: Diagnosis not present

## 2016-03-14 DIAGNOSIS — R0602 Shortness of breath: Secondary | ICD-10-CM | POA: Diagnosis not present

## 2016-03-14 DIAGNOSIS — R05 Cough: Secondary | ICD-10-CM | POA: Diagnosis not present

## 2016-03-28 DIAGNOSIS — M130 Polyarthritis, unspecified: Secondary | ICD-10-CM | POA: Diagnosis not present

## 2016-03-28 DIAGNOSIS — G43719 Chronic migraine without aura, intractable, without status migrainosus: Secondary | ICD-10-CM | POA: Diagnosis not present

## 2016-03-28 DIAGNOSIS — R251 Tremor, unspecified: Secondary | ICD-10-CM | POA: Diagnosis not present

## 2016-03-28 DIAGNOSIS — G4701 Insomnia due to medical condition: Secondary | ICD-10-CM | POA: Diagnosis not present

## 2016-03-29 ENCOUNTER — Ambulatory Visit (HOSPITAL_COMMUNITY)
Admission: RE | Admit: 2016-03-29 | Discharge: 2016-03-29 | Disposition: A | Discharge status: Home / Self Care | Payer: Medicare Other | Source: Ambulatory Visit | Attending: Family Medicine | Admitting: Family Medicine

## 2016-03-29 DIAGNOSIS — I499 Cardiac arrhythmia, unspecified: Secondary | ICD-10-CM | POA: Insufficient documentation

## 2016-03-29 DIAGNOSIS — R937 Abnormal findings on diagnostic imaging of other parts of musculoskeletal system: Secondary | ICD-10-CM | POA: Diagnosis not present

## 2016-04-15 DIAGNOSIS — M130 Polyarthritis, unspecified: Secondary | ICD-10-CM | POA: Diagnosis not present

## 2016-04-15 DIAGNOSIS — G43711 Chronic migraine without aura, intractable, with status migrainosus: Secondary | ICD-10-CM | POA: Diagnosis not present

## 2016-04-15 DIAGNOSIS — G4701 Insomnia due to medical condition: Secondary | ICD-10-CM | POA: Diagnosis not present

## 2016-04-15 DIAGNOSIS — R251 Tremor, unspecified: Secondary | ICD-10-CM | POA: Diagnosis not present

## 2016-04-26 ENCOUNTER — Ambulatory Visit (INDEPENDENT_AMBULATORY_CARE_PROVIDER_SITE_OTHER): Payer: Medicare Other | Admitting: Urology

## 2016-04-26 DIAGNOSIS — N401 Enlarged prostate with lower urinary tract symptoms: Secondary | ICD-10-CM

## 2016-06-27 DIAGNOSIS — G4701 Insomnia due to medical condition: Secondary | ICD-10-CM | POA: Diagnosis not present

## 2016-06-27 DIAGNOSIS — R251 Tremor, unspecified: Secondary | ICD-10-CM | POA: Diagnosis not present

## 2016-06-27 DIAGNOSIS — G43719 Chronic migraine without aura, intractable, without status migrainosus: Secondary | ICD-10-CM | POA: Diagnosis not present

## 2016-06-27 DIAGNOSIS — M130 Polyarthritis, unspecified: Secondary | ICD-10-CM | POA: Diagnosis not present

## 2016-07-21 DIAGNOSIS — G43719 Chronic migraine without aura, intractable, without status migrainosus: Secondary | ICD-10-CM | POA: Diagnosis not present

## 2016-07-21 DIAGNOSIS — R251 Tremor, unspecified: Secondary | ICD-10-CM | POA: Diagnosis not present

## 2016-07-21 DIAGNOSIS — G4701 Insomnia due to medical condition: Secondary | ICD-10-CM | POA: Diagnosis not present

## 2016-07-21 DIAGNOSIS — M130 Polyarthritis, unspecified: Secondary | ICD-10-CM | POA: Diagnosis not present

## 2016-09-15 DIAGNOSIS — J069 Acute upper respiratory infection, unspecified: Secondary | ICD-10-CM | POA: Diagnosis not present

## 2016-09-15 DIAGNOSIS — J209 Acute bronchitis, unspecified: Secondary | ICD-10-CM | POA: Diagnosis not present

## 2016-09-15 DIAGNOSIS — E663 Overweight: Secondary | ICD-10-CM | POA: Diagnosis not present

## 2016-09-15 DIAGNOSIS — Z6826 Body mass index (BMI) 26.0-26.9, adult: Secondary | ICD-10-CM | POA: Diagnosis not present

## 2016-11-11 DIAGNOSIS — G4701 Insomnia due to medical condition: Secondary | ICD-10-CM | POA: Diagnosis not present

## 2016-11-11 DIAGNOSIS — R251 Tremor, unspecified: Secondary | ICD-10-CM | POA: Diagnosis not present

## 2016-11-11 DIAGNOSIS — M130 Polyarthritis, unspecified: Secondary | ICD-10-CM | POA: Diagnosis not present

## 2016-11-11 DIAGNOSIS — G43719 Chronic migraine without aura, intractable, without status migrainosus: Secondary | ICD-10-CM | POA: Diagnosis not present

## 2016-11-24 DIAGNOSIS — G4701 Insomnia due to medical condition: Secondary | ICD-10-CM | POA: Diagnosis not present

## 2016-11-24 DIAGNOSIS — R569 Unspecified convulsions: Secondary | ICD-10-CM | POA: Diagnosis not present

## 2016-11-24 DIAGNOSIS — R251 Tremor, unspecified: Secondary | ICD-10-CM | POA: Diagnosis not present

## 2016-11-24 DIAGNOSIS — G43719 Chronic migraine without aura, intractable, without status migrainosus: Secondary | ICD-10-CM | POA: Diagnosis not present

## 2017-03-03 DIAGNOSIS — G43719 Chronic migraine without aura, intractable, without status migrainosus: Secondary | ICD-10-CM | POA: Diagnosis not present

## 2017-04-25 ENCOUNTER — Ambulatory Visit (INDEPENDENT_AMBULATORY_CARE_PROVIDER_SITE_OTHER): Payer: Medicare Other | Admitting: Urology

## 2017-04-25 DIAGNOSIS — N401 Enlarged prostate with lower urinary tract symptoms: Secondary | ICD-10-CM

## 2017-05-04 ENCOUNTER — Other Ambulatory Visit: Payer: Self-pay | Admitting: Urology

## 2017-05-11 DIAGNOSIS — Z6823 Body mass index (BMI) 23.0-23.9, adult: Secondary | ICD-10-CM | POA: Diagnosis not present

## 2017-05-11 DIAGNOSIS — G8929 Other chronic pain: Secondary | ICD-10-CM | POA: Diagnosis not present

## 2017-05-11 DIAGNOSIS — G43809 Other migraine, not intractable, without status migrainosus: Secondary | ICD-10-CM | POA: Diagnosis not present

## 2017-05-11 DIAGNOSIS — F5101 Primary insomnia: Secondary | ICD-10-CM | POA: Diagnosis not present

## 2017-05-18 ENCOUNTER — Encounter (HOSPITAL_BASED_OUTPATIENT_CLINIC_OR_DEPARTMENT_OTHER): Payer: Self-pay | Admitting: *Deleted

## 2017-05-18 ENCOUNTER — Other Ambulatory Visit: Payer: Self-pay

## 2017-05-18 NOTE — Progress Notes (Signed)
Npo after midnight arrive 630 am [facility] use albuterol inhaler prn/bring inhaler, albuterol nebulizer prn, gabapentin, metoprolol, omeprazole prn sip of water in am, wife driver, needs I stat 4 and ekg

## 2017-05-22 ENCOUNTER — Ambulatory Visit (HOSPITAL_BASED_OUTPATIENT_CLINIC_OR_DEPARTMENT_OTHER): Admission: RE | Admit: 2017-05-22 | Payer: Medicare Other | Source: Ambulatory Visit | Admitting: Urology

## 2017-05-22 HISTORY — DX: Personal history of urinary calculi: Z87.442

## 2017-05-22 SURGERY — CYSTOSCOPY WITH INSERTION OF UROLIFT
Anesthesia: General

## 2017-05-25 DIAGNOSIS — Z23 Encounter for immunization: Secondary | ICD-10-CM | POA: Diagnosis not present

## 2017-05-25 DIAGNOSIS — E782 Mixed hyperlipidemia: Secondary | ICD-10-CM | POA: Diagnosis not present

## 2017-05-25 DIAGNOSIS — Z6822 Body mass index (BMI) 22.0-22.9, adult: Secondary | ICD-10-CM | POA: Diagnosis not present

## 2017-05-25 DIAGNOSIS — I10 Essential (primary) hypertension: Secondary | ICD-10-CM | POA: Diagnosis not present

## 2017-06-20 ENCOUNTER — Ambulatory Visit (INDEPENDENT_AMBULATORY_CARE_PROVIDER_SITE_OTHER): Payer: Self-pay

## 2017-06-20 DIAGNOSIS — Z1211 Encounter for screening for malignant neoplasm of colon: Secondary | ICD-10-CM

## 2017-06-20 NOTE — Progress Notes (Signed)
Pt came in for a nurse visit to schedule a tcs from a referral from [facility]. Pt stated the last tcs he had done by another doctor (not our office) was terrible. He said he was awake for the entire thing and felt everything and was sick for 2 days afterward.   He had an EGD done by [facility] in 2011 with conscious sedation. Pt said he doesn't remember having any problems with the anesthesia then. I spoke with [facility] about this and he said it was ok to do his procedure in the OR. I have scheduled him an ov with [facility] on 06/23/17. Pt is aware of date and time and is in agreement to come back for ov. Pt then told me that he has been having diarrhea once a day for the past month. He said it was only first thing in the AM and he feels like it is d/t his diet. He has had all his teeth pulled recently and has been on a soft diet for a month.   Current med list has been reviewed and is correct.

## 2017-06-23 ENCOUNTER — Encounter: Payer: Self-pay | Admitting: *Deleted

## 2017-06-23 ENCOUNTER — Other Ambulatory Visit: Payer: Self-pay | Admitting: *Deleted

## 2017-06-23 ENCOUNTER — Encounter: Payer: Self-pay | Admitting: Gastroenterology

## 2017-06-23 ENCOUNTER — Ambulatory Visit (INDEPENDENT_AMBULATORY_CARE_PROVIDER_SITE_OTHER): Payer: Medicare Other | Admitting: Gastroenterology

## 2017-06-23 ENCOUNTER — Telehealth: Payer: Self-pay | Admitting: *Deleted

## 2017-06-23 DIAGNOSIS — Z1211 Encounter for screening for malignant neoplasm of colon: Secondary | ICD-10-CM

## 2017-06-23 DIAGNOSIS — Z9283 Personal history of failed moderate sedation: Secondary | ICD-10-CM | POA: Diagnosis not present

## 2017-06-23 MED ORDER — PEG 3350-KCL-NA BICARB-NACL 420 G PO SOLR: 4000.0000 mL | Freq: Once | ORAL | 0 refills | Status: AC

## 2017-06-23 NOTE — H&P (View-Only) (Signed)
Primary Care Physician:  Sharilyn Sites, MD  Primary Gastroenterologist:  Garfield Cornea, MD   Chief Complaint  Patient presents with  . Colonoscopy    consult    HPI:  Terry Morrow is a 67 y.o. male here to schedule screening colonoscopy at request of Dr. Hilma Favors. His last colonoscopy was done by Dr. Arnoldo Morale in 2008.  Patient states he was awake during the entire thing and it was a horrible experience.  Delayed follow-up due to this.  Reports that his colonoscopy was unremarkable without polyps and was told to come back in 10 years.  After procedure he developed nausea and vomiting, dehydration requiring ED visit.  EGD done by Dr. Gala Romney in 2011 with conscious sedation, he does not remember having any problems with that anesthesia.  Patient has chronic reflux.  He uses Pepcid over-the-counter with good relief.  Since 2 neck surgeries remotely he has had issues with swallowing bread unless he gets it wet.  Really no other issues with dysphagia.  No nausea or vomiting.  He reports stable weight.  He has had a lot of dental work recently and has been on a soft diet.  Since then his stools have been soft but only once daily.  No melena or rectal bleeding.    Current Outpatient Medications  Medication Sig Dispense Refill  . albuterol (PROVENTIL HFA) 108 (90 BASE) MCG/ACT inhaler Inhale 2 puffs into the lungs daily as needed for wheezing or shortness of breath.     Marland Kitchen albuterol (PROVENTIL) (2.5 MG/3ML) 0.083% nebulizer solution Take 2.5 mg by nebulization daily as needed for wheezing or shortness of breath. Shortness of breathe    . amitriptyline (ELAVIL) 100 MG tablet Take 100 mg by mouth at bedtime.     Marland Kitchen ibuprofen (ADVIL,MOTRIN) 800 MG tablet Take 1 tablet (800 mg total) by mouth 3 (three) times daily. (Patient taking differently: Take 800 mg by mouth as needed. ) 21 tablet 0  . metoprolol (LOPRESSOR) 100 MG tablet Take 100 mg by mouth 2 (two) times daily.    . nitroGLYCERIN (NITROSTAT) 0.4 MG SL  tablet Place 0.4 mg under the tongue every 5 (five) minutes as needed. Chest pain    . pravastatin (PRAVACHOL) 40 MG tablet Take 40 mg by mouth at bedtime.    . SUMAtriptan (IMITREX) 50 MG tablet Take 50 mg by mouth every 2 (two) hours as needed for migraine.    . Tamsulosin HCl (FLOMAX) 0.4 MG CAPS Take 0.8 mg by mouth at bedtime.     Marland Kitchen zolpidem (AMBIEN) 10 MG tablet Take 10 mg by mouth at bedtime. sleep     No current facility-administered medications for this visit.     Allergies as of 06/23/2017  . (No Known Allergies)    Past Medical History:  Diagnosis Date  . GERD (gastroesophageal reflux disease)   . Headache(784.0)    MIGRAINE  --  LAST ONE ACOUPLE DAYS AGO  . History of kidney stones yrs ago  . Hypertension   . Insomnia   . Mild emphysema ([place])   . Neuromuscular disorder (HCC)    neuropathy improved since off depakote  . PONV (postoperative nausea and vomiting)   . Prostate disorder    enlarged  . Shortness of breath    with over exertion    Past Surgical History:  Procedure Laterality Date  . BONE ANCHORED HEARING AID IMPLANT Left 11/15/2012   Procedure: LEFT BONE ANCHORED HEARING AID (BAHA) IMPLANT;  Surgeon: Izora Gala, MD;  Location: [facility] OR;  Service: ENT;  Laterality: Left;  . ELBOW SURGERY     x 2 on both  . ESOPHAGOGASTRODUODENOSCOPY  2011   Dr. Gala Romney: Empiric dilation for history of dysphagia, hiatal hernia  . EYE SURGERY     BIL  CATARACTS  . INNER EAR SURGERY     x 2 on left  . KNEE SURGERY     x 2 both knees arthroscopic  . MASTOIDECTOMY     left  . NECK SURGERY     x 4 stiff at times limited neck rom  . WRIST SURGERY     left    History reviewed. No pertinent family history.  Social History   Socioeconomic History  . Marital status: Married    Spouse name: Not on file  . Number of children: Not on file  . Years of education: Not on file  . Highest education level: Not on file  Social Needs  . Financial resource strain: Not on file   . Food insecurity - worry: Not on file  . Food insecurity - inability: Not on file  . Transportation needs - medical: Not on file  . Transportation needs - non-medical: Not on file  Occupational History  . Not on file  Tobacco Use  . Smoking status: Current Every Day Smoker    Packs/day: 0.50    Years: 38.00    Pack years: 19.00    Types: Cigarettes  . Smokeless tobacco: Never Used  Substance and Sexual Activity  . Alcohol use: No  . Drug use: No  . Sexual activity: Not on file  Other Topics Concern  . Not on file  Social History Narrative  . Not on file      ROS:  General: Negative for anorexia, weight loss, fever, chills, fatigue, weakness. Eyes: Negative for vision changes.  ENT: Negative for hoarseness, difficulty swallowing , nasal congestion. CV: Negative for chest pain, angina, palpitations, dyspnea on exertion, peripheral edema.  Respiratory: Negative for dyspnea at rest, dyspnea on exertion, cough, sputum, wheezing.  GI: See history of present illness. GU:  Negative for dysuria, hematuria, urinary incontinence, urinary frequency, nocturnal urination.  MS: Negative for joint pain, low back pain.  Derm: Negative for rash or itching.  Neuro: Negative for weakness, abnormal sensation, seizure, frequent headaches, memory loss, confusion.  Psych: Negative for anxiety, depression, suicidal ideation, hallucinations.  Endo: Negative for unusual weight change.  Heme: Negative for bruising or bleeding. Allergy: Negative for rash or hives.    Physical Examination:  BP 98/62   Pulse 85   Temp (!) 97.1 F (36.2 C) (Oral)   Ht 5\' 11"  (1.803 m)   Wt 162 lb (73.5 kg)   BMI 22.59 kg/m    General: Well-nourished, well-developed in no acute distress.  Head: Normocephalic, atraumatic.   Eyes: Conjunctiva pink, no icterus. Mouth: Oropharyngeal mucosa moist and pink , no lesions erythema or exudate. Neck: Supple without thyromegaly, masses, or lymphadenopathy.  Lungs:  Clear to auscultation bilaterally.  Heart: Regular rate and rhythm, no murmurs rubs or gallops.  Abdomen: Bowel sounds are normal, nontender, nondistended, no hepatosplenomegaly or masses, no abdominal bruits or    hernia , no rebound or guarding.   Rectal: Not performed Extremities: No lower extremity edema. No clubbing or deformities.  Neuro: Alert and oriented x 4 , grossly normal neurologically.  Skin: Warm and dry, no rash or jaundice.   Psych: Alert and cooperative, normal mood and affect.  Labs: 05/25/2017, BUN  10, creatinine 1.04, glucose 85  Imaging Studies: No results found.

## 2017-06-23 NOTE — Patient Instructions (Signed)
1. Colonoscopy as scheduled. See separate instructions.  

## 2017-06-23 NOTE — Assessment & Plan Note (Signed)
67 year old gentleman presenting for screening colonoscopy with history of failed conscious sedation in 2008.  Apparently did well with conscious sedation in 2011 at time of endoscopy.  I do not have records from his colonoscopy.  Case was previously discussed with Dr. Gala Romney by our nursing staff it was decided to pursue colonoscopy with deep sedation.  Patient was brought in for visit to schedule.  Outside of a few week history of soft stools 1-2 times every morning in the setting of dietary change, clinically is doing well.  I have discussed the risks, alternatives, benefits with regards to but not limited to the risk of reaction to medication, bleeding, infection, perforation and the patient is agreeable to proceed. Written consent to be obtained.

## 2017-06-23 NOTE — Telephone Encounter (Signed)
LMOVM. Pre-op scheduled for 07/14/17 at 1:45pm. Letter mailed.

## 2017-06-23 NOTE — Progress Notes (Signed)
Primary Care Physician:  Sharilyn Sites, MD  Primary Gastroenterologist:  Garfield Cornea, MD   Chief Complaint  Patient presents with  . Colonoscopy    consult    HPI:  Terry Morrow is a 67 y.o. male here to schedule screening colonoscopy at request of Dr. Hilma Favors. His last colonoscopy was done by Dr. Arnoldo Morale in 2008.  Patient states he was awake during the entire thing and it was a horrible experience.  Delayed follow-up due to this.  Reports that his colonoscopy was unremarkable without polyps and was told to come back in 10 years.  After procedure he developed nausea and vomiting, dehydration requiring ED visit.  EGD done by Dr. Gala Romney in 2011 with conscious sedation, he does not remember having any problems with that anesthesia.  Patient has chronic reflux.  He uses Pepcid over-the-counter with good relief.  Since 2 neck surgeries remotely he has had issues with swallowing bread unless he gets it wet.  Really no other issues with dysphagia.  No nausea or vomiting.  He reports stable weight.  He has had a lot of dental work recently and has been on a soft diet.  Since then his stools have been soft but only once daily.  No melena or rectal bleeding.    Current Outpatient Medications  Medication Sig Dispense Refill  . albuterol (PROVENTIL HFA) 108 (90 BASE) MCG/ACT inhaler Inhale 2 puffs into the lungs daily as needed for wheezing or shortness of breath.     Marland Kitchen albuterol (PROVENTIL) (2.5 MG/3ML) 0.083% nebulizer solution Take 2.5 mg by nebulization daily as needed for wheezing or shortness of breath. Shortness of breathe    . amitriptyline (ELAVIL) 100 MG tablet Take 100 mg by mouth at bedtime.     Marland Kitchen ibuprofen (ADVIL,MOTRIN) 800 MG tablet Take 1 tablet (800 mg total) by mouth 3 (three) times daily. (Patient taking differently: Take 800 mg by mouth as needed. ) 21 tablet 0  . metoprolol (LOPRESSOR) 100 MG tablet Take 100 mg by mouth 2 (two) times daily.    . nitroGLYCERIN (NITROSTAT) 0.4 MG SL  tablet Place 0.4 mg under the tongue every 5 (five) minutes as needed. Chest pain    . pravastatin (PRAVACHOL) 40 MG tablet Take 40 mg by mouth at bedtime.    . SUMAtriptan (IMITREX) 50 MG tablet Take 50 mg by mouth every 2 (two) hours as needed for migraine.    . Tamsulosin HCl (FLOMAX) 0.4 MG CAPS Take 0.8 mg by mouth at bedtime.     Marland Kitchen zolpidem (AMBIEN) 10 MG tablet Take 10 mg by mouth at bedtime. sleep     No current facility-administered medications for this visit.     Allergies as of 06/23/2017  . (No Known Allergies)    Past Medical History:  Diagnosis Date  . GERD (gastroesophageal reflux disease)   . Headache(784.0)    MIGRAINE  --  LAST ONE ACOUPLE DAYS AGO  . History of kidney stones yrs ago  . Hypertension   . Insomnia   . Mild emphysema ([place])   . Neuromuscular disorder (HCC)    neuropathy improved since off depakote  . PONV (postoperative nausea and vomiting)   . Prostate disorder    enlarged  . Shortness of breath    with over exertion    Past Surgical History:  Procedure Laterality Date  . BONE ANCHORED HEARING AID IMPLANT Left 11/15/2012   Procedure: LEFT BONE ANCHORED HEARING AID (BAHA) IMPLANT;  Surgeon: Izora Gala, MD;  Location: [facility] OR;  Service: ENT;  Laterality: Left;  . ELBOW SURGERY     x 2 on both  . ESOPHAGOGASTRODUODENOSCOPY  2011   Dr. Gala Romney: Empiric dilation for history of dysphagia, hiatal hernia  . EYE SURGERY     BIL  CATARACTS  . INNER EAR SURGERY     x 2 on left  . KNEE SURGERY     x 2 both knees arthroscopic  . MASTOIDECTOMY     left  . NECK SURGERY     x 4 stiff at times limited neck rom  . WRIST SURGERY     left    History reviewed. No pertinent family history.  Social History   Socioeconomic History  . Marital status: Married    Spouse name: Not on file  . Number of children: Not on file  . Years of education: Not on file  . Highest education level: Not on file  Social Needs  . Financial resource strain: Not on file   . Food insecurity - worry: Not on file  . Food insecurity - inability: Not on file  . Transportation needs - medical: Not on file  . Transportation needs - non-medical: Not on file  Occupational History  . Not on file  Tobacco Use  . Smoking status: Current Every Day Smoker    Packs/day: 0.50    Years: 38.00    Pack years: 19.00    Types: Cigarettes  . Smokeless tobacco: Never Used  Substance and Sexual Activity  . Alcohol use: No  . Drug use: No  . Sexual activity: Not on file  Other Topics Concern  . Not on file  Social History Narrative  . Not on file      ROS:  General: Negative for anorexia, weight loss, fever, chills, fatigue, weakness. Eyes: Negative for vision changes.  ENT: Negative for hoarseness, difficulty swallowing , nasal congestion. CV: Negative for chest pain, angina, palpitations, dyspnea on exertion, peripheral edema.  Respiratory: Negative for dyspnea at rest, dyspnea on exertion, cough, sputum, wheezing.  GI: See history of present illness. GU:  Negative for dysuria, hematuria, urinary incontinence, urinary frequency, nocturnal urination.  MS: Negative for joint pain, low back pain.  Derm: Negative for rash or itching.  Neuro: Negative for weakness, abnormal sensation, seizure, frequent headaches, memory loss, confusion.  Psych: Negative for anxiety, depression, suicidal ideation, hallucinations.  Endo: Negative for unusual weight change.  Heme: Negative for bruising or bleeding. Allergy: Negative for rash or hives.    Physical Examination:  BP 98/62   Pulse 85   Temp (!) 97.1 F (36.2 C) (Oral)   Ht 5\' 11"  (1.803 m)   Wt 162 lb (73.5 kg)   BMI 22.59 kg/m    General: Well-nourished, well-developed in no acute distress.  Head: Normocephalic, atraumatic.   Eyes: Conjunctiva pink, no icterus. Mouth: Oropharyngeal mucosa moist and pink , no lesions erythema or exudate. Neck: Supple without thyromegaly, masses, or lymphadenopathy.  Lungs:  Clear to auscultation bilaterally.  Heart: Regular rate and rhythm, no murmurs rubs or gallops.  Abdomen: Bowel sounds are normal, nontender, nondistended, no hepatosplenomegaly or masses, no abdominal bruits or    hernia , no rebound or guarding.   Rectal: Not performed Extremities: No lower extremity edema. No clubbing or deformities.  Neuro: Alert and oriented x 4 , grossly normal neurologically.  Skin: Warm and dry, no rash or jaundice.   Psych: Alert and cooperative, normal mood and affect.  Labs: 05/25/2017, BUN  10, creatinine 1.04, glucose 85  Imaging Studies: No results found.

## 2017-06-26 NOTE — Progress Notes (Signed)
cc'd to pcp 

## 2017-07-11 NOTE — Patient Instructions (Signed)
Terry Morrow  07/11/2017     @PREFPERIOPPHARMACY @   Your procedure is scheduled on  07/20/2017   Report to [facility] at  900  A.M.  Call this number if you have problems the morning of surgery:  [phone]   Remember:  Do not eat food or drink liquids after midnight.  Take these medicines the morning of surgery with A SIP OF WATER  Hydrocodone, metoprolol, imitrex. Use your nebulizer and your inhaler before you come.   Do not wear jewelry, make-up or nail polish.  Do not wear lotions, powders, or perfumes, or deodorant.  Do not shave 48 hours prior to surgery.  Men may shave face and neck.  Do not bring valuables to the hospital.  [facility] is not responsible for any belongings or valuables.  Contacts, dentures or bridgework may not be worn into surgery.  Leave your suitcase in the car.  After surgery it may be brought to your room.  For patients admitted to the hospital, discharge time will be determined by your treatment team.  Patients discharged the day of surgery will not be allowed to drive home.   Name and phone number of your driver:   family Special instructions:  Follow the diet and prep instructions given to you by Dr Roseanne Kaufman office.  Please read over the following fact sheets that you were given. Anesthesia Post-op Instructions and Care and Recovery After Surgery       Colonoscopy, Adult A colonoscopy is an exam to look at the large intestine. It is done to check for problems, such as:  Lumps (tumors).  Growths (polyps).  Swelling (inflammation).  Bleeding.  What happens before the procedure? Eating and drinking Follow instructions from your doctor about eating and drinking. These instructions may include:  A few days before the procedure - follow a low-fiber diet. ? Avoid nuts. ? Avoid seeds. ? Avoid dried fruit. ? Avoid raw fruits. ? Avoid vegetables.  1-3 days before the procedure - follow a clear liquid diet. Avoid  liquids that have red or purple dye. Drink only clear liquids, such as: ? Clear broth or bouillon. ? Black coffee or tea. ? Clear juice. ? Clear soft drinks or sports drinks. ? Gelatin dessert. ? Popsicles.  On the day of the procedure - do not eat or drink anything during the 2 hours before the procedure.  Bowel prep If you were prescribed an oral bowel prep:  Take it as told by your doctor. Starting the day before your procedure, you will need to drink a lot of liquid. The liquid will cause you to poop (have bowel movements) until your poop is almost clear or light green.  If your skin or butt gets irritated from diarrhea, you may: ? Wipe the area with wipes that have medicine in them, such as adult wet wipes with aloe and vitamin E. ? Put something on your skin that soothes the area, such as petroleum jelly.  If you throw up (vomit) while drinking the bowel prep, take a break for up to 60 minutes. Then begin the bowel prep again. If you keep throwing up and you cannot take the bowel prep without throwing up, call your doctor.  General instructions  Ask your doctor about changing or stopping your normal medicines. This is important if you take diabetes medicines or blood thinners.  Plan to have someone take you home from the hospital or clinic. What  happens during the procedure?  An IV tube may be put into one of your veins.  You will be given medicine to help you relax (sedative).  To reduce your risk of infection: ? Your doctors will wash their hands. ? Your anal area will be washed with soap.  You will be asked to lie on your side with your knees bent.  Your doctor will get a long, thin, flexible tube ready. The tube will have a camera and a light on the end.  The tube will be put into your anus.  The tube will be gently put into your large intestine.  Air will be delivered into your large intestine to keep it open. You may feel some pressure or cramping.  The  camera will be used to take photos.  A small tissue sample may be removed from your body to be looked at under a microscope (biopsy). If any possible problems are found, the tissue will be sent to a lab for testing.  If small growths are found, your doctor may remove them and have them checked for cancer.  The tube that was put into your anus will be slowly removed. The procedure may vary among doctors and hospitals. What happens after the procedure?  Your doctor will check on you often until the medicines you were given have worn off.  Do not drive for 24 hours after the procedure.  You may have a small amount of blood in your poop.  You may pass gas.  You may have mild cramps or bloating in your belly (abdomen).  It is up to you to get the results of your procedure. Ask your doctor, or the department performing the procedure, when your results will be ready. This information is not intended to replace advice given to you by your health care provider. Make sure you discuss any questions you have with your health care provider. Document Released: 06/25/2010 Document Revised: 03/23/2016 Document Reviewed: 08/04/2015 Elsevier Interactive Patient Education  2017 [employer]  Colonoscopy, Adult, Care After This sheet gives you information about how to care for yourself after your procedure. Your health care provider may also give you more specific instructions. If you have problems or questions, contact your health care provider. What can I expect after the procedure? After the procedure, it is common to have:  A small amount of blood in your stool for 24 hours after the procedure.  Some gas.  Mild abdominal cramping or bloating.  Follow these instructions at home: General instructions   For the first 24 hours after the procedure: ? Do not drive or use machinery. ? Do not sign important documents. ? Do not drink alcohol. ? Do your regular daily activities at a slower pace  than normal. ? Eat soft, easy-to-digest foods. ? Rest often.  Take over-the-counter or prescription medicines only as told by your health care provider.  It is up to you to get the results of your procedure. Ask your health care provider, or the department performing the procedure, when your results will be ready. Relieving cramping and bloating  Try walking around when you have cramps or feel bloated.  Apply heat to your abdomen as told by your health care provider. Use a heat source that your health care provider recommends, such as a moist heat pack or a heating pad. ? Place a towel between your skin and the heat source. ? Leave the heat on for 20-30 minutes. ? Remove the heat if your skin  turns bright red. This is especially important if you are unable to feel pain, heat, or cold. You may have a greater risk of getting burned. Eating and drinking  Drink enough fluid to keep your urine clear or pale yellow.  Resume your normal diet as instructed by your health care provider. Avoid heavy or fried foods that are hard to digest.  Avoid drinking alcohol for as long as instructed by your health care provider. Contact a health care provider if:  You have blood in your stool 2-3 days after the procedure. Get help right away if:  You have more than a small spotting of blood in your stool.  You pass large blood clots in your stool.  Your abdomen is swollen.  You have nausea or vomiting.  You have a fever.  You have increasing abdominal pain that is not relieved with medicine. This information is not intended to replace advice given to you by your health care provider. Make sure you discuss any questions you have with your health care provider. Document Released: 01/05/2004 Document Revised: 02/15/2016 Document Reviewed: 08/04/2015 Elsevier Interactive Patient Education  2018 [place] Anesthesia is a term that refers to techniques, procedures, and  medicines that help a person stay safe and comfortable during a medical procedure. Monitored anesthesia care, or sedation, is one type of anesthesia. Your anesthesia specialist may recommend sedation if you will be having a procedure that does not require you to be unconscious, such as:  Cataract surgery.  A dental procedure.  A biopsy.  A colonoscopy.  During the procedure, you may receive a medicine to help you relax (sedative). There are three levels of sedation:  Mild sedation. At this level, you may feel awake and relaxed. You will be able to follow directions.  Moderate sedation. At this level, you will be sleepy. You may not remember the procedure.  Deep sedation. At this level, you will be asleep. You will not remember the procedure.  The more medicine you are given, the deeper your level of sedation will be. Depending on how you respond to the procedure, the anesthesia specialist may change your level of sedation or the type of anesthesia to fit your needs. An anesthesia specialist will monitor you closely during the procedure. Let your health care provider know about:  Any allergies you have.  All medicines you are taking, including vitamins, herbs, eye drops, creams, and over-the-counter medicines.  Any use of steroids (by mouth or as a cream).  Any problems you or family members have had with sedatives and anesthetic medicines.  Any blood disorders you have.  Any surgeries you have had.  Any medical conditions you have, such as sleep apnea.  Whether you are pregnant or may be pregnant.  Any use of cigarettes, alcohol, or street drugs. What are the risks? Generally, this is a safe procedure. However, problems may occur, including:  Getting too much medicine (oversedation).  Nausea.  Allergic reaction to medicines.  Trouble breathing. If this happens, a breathing tube may be used to help with breathing. It will be removed when you are awake and breathing on  your own.  Heart trouble.  Lung trouble.  Before the procedure Staying hydrated Follow instructions from your health care provider about hydration, which may include:  Up to 2 hours before the procedure - you may continue to drink clear liquids, such as water, clear fruit juice, black coffee, and plain tea.  Eating and drinking restrictions Follow instructions  from your health care provider about eating and drinking, which may include:  8 hours before the procedure - stop eating heavy meals or foods such as meat, fried foods, or fatty foods.  6 hours before the procedure - stop eating light meals or foods, such as toast or cereal.  6 hours before the procedure - stop drinking milk or drinks that contain milk.  2 hours before the procedure - stop drinking clear liquids.  Medicines Ask your health care provider about:  Changing or stopping your regular medicines. This is especially important if you are taking diabetes medicines or blood thinners.  Taking medicines such as aspirin and ibuprofen. These medicines can thin your blood. Do not take these medicines before your procedure if your health care provider instructs you not to.  Tests and exams  You will have a physical exam.  You may have blood tests done to show: ? How well your kidneys and liver are working. ? How well your blood can clot.  General instructions  Plan to have someone take you home from the hospital or clinic.  If you will be going home right after the procedure, plan to have someone with you for 24 hours.  What happens during the procedure?  Your blood pressure, heart rate, breathing, level of pain and overall condition will be monitored.  An IV tube will be inserted into one of your veins.  Your anesthesia specialist will give you medicines as needed to keep you comfortable during the procedure. This may mean changing the level of sedation.  The procedure will be performed. After the  procedure  Your blood pressure, heart rate, breathing rate, and blood oxygen level will be monitored until the medicines you were given have worn off.  Do not drive for 24 hours if you received a sedative.  You may: ? Feel sleepy, clumsy, or nauseous. ? Feel forgetful about what happened after the procedure. ? Have a sore throat if you had a breathing tube during the procedure. ? Vomit. This information is not intended to replace advice given to you by your health care provider. Make sure you discuss any questions you have with your health care provider. Document Released: 02/16/2005 Document Revised: 10/30/2015 Document Reviewed: 09/13/2015 Elsevier Interactive Patient Education  2018 [place], Care After These instructions provide you with information about caring for yourself after your procedure. Your health care provider may also give you more specific instructions. Your treatment has been planned according to current medical practices, but problems sometimes occur. Call your health care provider if you have any problems or questions after your procedure. What can I expect after the procedure? After your procedure, it is common to:  Feel sleepy for several hours.  Feel clumsy and have poor balance for several hours.  Feel forgetful about what happened after the procedure.  Have poor judgment for several hours.  Feel nauseous or vomit.  Have a sore throat if you had a breathing tube during the procedure.  Follow these instructions at home: For at least 24 hours after the procedure:   Do not: ? Participate in activities in which you could fall or become injured. ? Drive. ? Use heavy machinery. ? Drink alcohol. ? Take sleeping pills or medicines that cause drowsiness. ? Make important decisions or sign legal documents. ? Take care of children on your own.  Rest. Eating and drinking  Follow the diet that is recommended by your health  care provider.  If  you vomit, drink water, juice, or soup when you can drink without vomiting.  Make sure you have little or no nausea before eating solid foods. General instructions  Have a responsible adult stay with you until you are awake and alert.  Take over-the-counter and prescription medicines only as told by your health care provider.  If you smoke, do not smoke without supervision.  Keep all follow-up visits as told by your health care provider. This is important. Contact a health care provider if:  You keep feeling nauseous or you keep vomiting.  You feel light-headed.  You develop a rash.  You have a fever. Get help right away if:  You have trouble breathing. This information is not intended to replace advice given to you by your health care provider. Make sure you discuss any questions you have with your health care provider. Document Released: 09/13/2015 Document Revised: 01/13/2016 Document Reviewed: 09/13/2015 Elsevier Interactive Patient Education  [employer].

## 2017-07-14 ENCOUNTER — Encounter (HOSPITAL_COMMUNITY)
Admission: RE | Admit: 2017-07-14 | Discharge: 2017-07-14 | Disposition: A | Discharge status: Home / Self Care | Payer: Medicare Other | Source: Ambulatory Visit | Attending: Internal Medicine | Admitting: Internal Medicine

## 2017-07-14 ENCOUNTER — Other Ambulatory Visit: Payer: Self-pay

## 2017-07-14 ENCOUNTER — Encounter (HOSPITAL_COMMUNITY): Payer: Self-pay

## 2017-07-14 DIAGNOSIS — Z01812 Encounter for preprocedural laboratory examination: Secondary | ICD-10-CM | POA: Insufficient documentation

## 2017-07-14 DIAGNOSIS — Z01810 Encounter for preprocedural cardiovascular examination: Secondary | ICD-10-CM | POA: Insufficient documentation

## 2017-07-14 LAB — BASIC METABOLIC PANEL
Anion gap: 11 (ref 5–15)
BUN: 18 mg/dL (ref 6–20)
CALCIUM: 9.1 mg/dL (ref 8.9–10.3)
CHLORIDE: 102 mmol/L (ref 101–111)
CO2: 21 mmol/L — AB (ref 22–32)
CREATININE: 1.25 mg/dL — AB (ref 0.61–1.24)
GFR calc Af Amer: >60 mL/min (ref >60)
GFR calc non Af Amer: 58 mL/min — ABNORMAL LOW (ref >60)
GLUCOSE: 106 mg/dL — AB (ref 65–99)
Potassium: 3.9 mmol/L (ref 3.5–5.1)
Sodium: 134 mmol/L — ABNORMAL LOW (ref 135–145)

## 2017-07-14 LAB — CBC WITH DIFFERENTIAL/PLATELET
BASOS PCT: 0 %
Basophils Absolute: 0 10*3/uL (ref 0.0–0.1)
Eosinophils Absolute: 0.2 10*3/uL (ref 0.0–0.7)
Eosinophils Relative: 2 %
HEMATOCRIT: 39 % (ref 39.0–52.0)
HEMOGLOBIN: 13.3 g/dL (ref 13.0–17.0)
LYMPHS ABS: 2.6 10*3/uL (ref 0.7–4.0)
Lymphocytes Relative: 29 %
MCH: 31.1 pg (ref 26.0–34.0)
MCHC: 34.1 g/dL (ref 30.0–36.0)
MCV: 91.1 fL (ref 78.0–100.0)
MONOS PCT: 9 %
Monocytes Absolute: 0.8 10*3/uL (ref 0.1–1.0)
NEUTROS ABS: 5.4 10*3/uL (ref 1.7–7.7)
Neutrophils Relative %: 60 %
Platelets: 355 10*3/uL (ref 150–400)
RBC: 4.28 MIL/uL (ref 4.22–5.81)
RDW: 13.3 % (ref 11.5–15.5)
WBC: 9 10*3/uL (ref 4.0–10.5)

## 2017-07-20 ENCOUNTER — Ambulatory Visit (HOSPITAL_COMMUNITY): Payer: Medicare Other | Admitting: Anesthesiology

## 2017-07-20 ENCOUNTER — Ambulatory Visit (HOSPITAL_COMMUNITY)
Admission: RE | Admit: 2017-07-20 | Discharge: 2017-07-20 | Disposition: A | Discharge status: Home / Self Care | Payer: Medicare Other | Source: Ambulatory Visit | Attending: Internal Medicine | Admitting: Internal Medicine

## 2017-07-20 ENCOUNTER — Encounter (HOSPITAL_COMMUNITY)
Admission: RE | Disposition: A | Discharge status: Home / Self Care | Payer: Self-pay | Source: Ambulatory Visit | Attending: Internal Medicine

## 2017-07-20 ENCOUNTER — Encounter (HOSPITAL_COMMUNITY): Payer: Self-pay | Admitting: *Deleted

## 2017-07-20 DIAGNOSIS — G43909 Migraine, unspecified, not intractable, without status migrainosus: Secondary | ICD-10-CM | POA: Insufficient documentation

## 2017-07-20 DIAGNOSIS — R0602 Shortness of breath: Secondary | ICD-10-CM | POA: Insufficient documentation

## 2017-07-20 DIAGNOSIS — D125 Benign neoplasm of sigmoid colon: Secondary | ICD-10-CM | POA: Diagnosis not present

## 2017-07-20 DIAGNOSIS — Z1211 Encounter for screening for malignant neoplasm of colon: Secondary | ICD-10-CM | POA: Diagnosis not present

## 2017-07-20 DIAGNOSIS — N400 Enlarged prostate without lower urinary tract symptoms: Secondary | ICD-10-CM | POA: Diagnosis not present

## 2017-07-20 DIAGNOSIS — D124 Benign neoplasm of descending colon: Secondary | ICD-10-CM | POA: Diagnosis not present

## 2017-07-20 DIAGNOSIS — Z79899 Other long term (current) drug therapy: Secondary | ICD-10-CM | POA: Insufficient documentation

## 2017-07-20 DIAGNOSIS — K449 Diaphragmatic hernia without obstruction or gangrene: Secondary | ICD-10-CM | POA: Insufficient documentation

## 2017-07-20 DIAGNOSIS — D127 Benign neoplasm of rectosigmoid junction: Secondary | ICD-10-CM | POA: Diagnosis not present

## 2017-07-20 DIAGNOSIS — K219 Gastro-esophageal reflux disease without esophagitis: Secondary | ICD-10-CM | POA: Diagnosis not present

## 2017-07-20 DIAGNOSIS — D123 Benign neoplasm of transverse colon: Secondary | ICD-10-CM | POA: Diagnosis not present

## 2017-07-20 DIAGNOSIS — D128 Benign neoplasm of rectum: Secondary | ICD-10-CM | POA: Diagnosis not present

## 2017-07-20 DIAGNOSIS — F17210 Nicotine dependence, cigarettes, uncomplicated: Secondary | ICD-10-CM | POA: Diagnosis not present

## 2017-07-20 DIAGNOSIS — J439 Emphysema, unspecified: Secondary | ICD-10-CM | POA: Insufficient documentation

## 2017-07-20 DIAGNOSIS — K621 Rectal polyp: Secondary | ICD-10-CM

## 2017-07-20 DIAGNOSIS — Z1212 Encounter for screening for malignant neoplasm of rectum: Secondary | ICD-10-CM

## 2017-07-20 DIAGNOSIS — G4700 Insomnia, unspecified: Secondary | ICD-10-CM | POA: Insufficient documentation

## 2017-07-20 DIAGNOSIS — I10 Essential (primary) hypertension: Secondary | ICD-10-CM | POA: Diagnosis not present

## 2017-07-20 DIAGNOSIS — Z87442 Personal history of urinary calculi: Secondary | ICD-10-CM | POA: Insufficient documentation

## 2017-07-20 DIAGNOSIS — K5730 Diverticulosis of large intestine without perforation or abscess without bleeding: Secondary | ICD-10-CM | POA: Diagnosis not present

## 2017-07-20 DIAGNOSIS — D120 Benign neoplasm of cecum: Secondary | ICD-10-CM | POA: Diagnosis not present

## 2017-07-20 HISTORY — PX: POLYPECTOMY: SHX5525

## 2017-07-20 HISTORY — PX: COLONOSCOPY WITH PROPOFOL: SHX5780

## 2017-07-20 SURGERY — COLONOSCOPY WITH PROPOFOL
Anesthesia: Monitor Anesthesia Care

## 2017-07-20 MED ORDER — DEXAMETHASONE SODIUM PHOSPHATE 4 MG/ML IJ SOLN
4.0000 mg | INTRAMUSCULAR | Status: AC
  Administered 2017-07-20: 4 mg via INTRAVENOUS

## 2017-07-20 MED ORDER — ONDANSETRON HCL 4 MG/2ML IJ SOLN
4.0000 mg | Freq: Once | INTRAMUSCULAR | Status: AC
  Administered 2017-07-20: 4 mg via INTRAVENOUS

## 2017-07-20 MED ORDER — DEXAMETHASONE SODIUM PHOSPHATE 4 MG/ML IJ SOLN
INTRAMUSCULAR | Status: AC
  Filled 2017-07-20: qty 1

## 2017-07-20 MED ORDER — MIDAZOLAM HCL 2 MG/2ML IJ SOLN
INTRAMUSCULAR | Status: AC
  Filled 2017-07-20: qty 2

## 2017-07-20 MED ORDER — STERILE WATER FOR IRRIGATION IR SOLN
Status: DC | PRN
  Administered 2017-07-20: 15 mL

## 2017-07-20 MED ORDER — FENTANYL CITRATE (PF) 100 MCG/2ML IJ SOLN
INTRAMUSCULAR | Status: AC
  Filled 2017-07-20: qty 2

## 2017-07-20 MED ORDER — PROPOFOL 500 MG/50ML IV EMUL
INTRAVENOUS | Status: DC | PRN
  Administered 2017-07-20: 150 ug/kg/min via INTRAVENOUS
  Administered 2017-07-20: 100 ug/kg/min via INTRAVENOUS

## 2017-07-20 MED ORDER — MIDAZOLAM HCL 2 MG/2ML IJ SOLN
1.0000 mg | INTRAMUSCULAR | Status: AC
  Administered 2017-07-20: 2 mg via INTRAVENOUS

## 2017-07-20 MED ORDER — PROPOFOL 10 MG/ML IV BOLUS
INTRAVENOUS | Status: DC | PRN
  Administered 2017-07-20 (×2): 10 mg via INTRAVENOUS
  Administered 2017-07-20: 7.3 mg via INTRAVENOUS

## 2017-07-20 MED ORDER — FENTANYL CITRATE (PF) 100 MCG/2ML IJ SOLN
25.0000 ug | Freq: Once | INTRAMUSCULAR | Status: AC
  Administered 2017-07-20: 25 ug via INTRAVENOUS

## 2017-07-20 MED ORDER — LACTATED RINGERS IV SOLN
INTRAVENOUS | Status: DC
  Administered 2017-07-20: 10:00:00 via INTRAVENOUS

## 2017-07-20 MED ORDER — ONDANSETRON HCL 4 MG/2ML IJ SOLN
INTRAMUSCULAR | Status: AC
  Filled 2017-07-20: qty 2

## 2017-07-20 NOTE — Anesthesia Preprocedure Evaluation (Signed)
Anesthesia Evaluation  Patient identified by MRN, date of birth, ID band Patient awake    Reviewed: Allergy & Precautions, H&P , NPO status , Patient's Chart, lab work & pertinent test results, reviewed documented beta blocker date and time   History of Anesthesia Complications (+) PONV and history of anesthetic complications  Airway Mallampati: I  TM Distance: >3 FB Neck ROM: full    Dental no notable dental hx. (+) Dental Advisory Given, Edentulous Upper, Edentulous Lower   Pulmonary shortness of breath and with exertion, asthma , COPD,  COPD inhaler, Current Smoker,  Mild emphysema   Pulmonary exam normal breath sounds clear to auscultation       Cardiovascular hypertension, Pt. on medications Normal cardiovascular exam Rhythm:regular Rate:Normal     Neuro/Psych  Headaches, negative psych ROS   GI/Hepatic Neg liver ROS, GERD  Medicated and Controlled,  Endo/Other  negative endocrine ROS  Renal/GU negative Renal ROS  negative genitourinary   Musculoskeletal   Abdominal   Peds  Hematology negative hematology ROS (+)   Anesthesia Other Findings   Reproductive/Obstetrics negative OB ROS                             Anesthesia Physical Anesthesia Plan  ASA: III  Anesthesia Plan: MAC   Post-op Pain Management:    Induction: Intravenous  PONV Risk Score and Plan:   Airway Management Planned: Simple Face Mask  Additional Equipment:   Intra-op Plan:   Post-operative Plan:   Informed Consent: I have reviewed the patients History and Physical, chart, labs and discussed the procedure including the risks, benefits and alternatives for the proposed anesthesia with the patient or authorized representative who has indicated his/her understanding and acceptance.     Plan Discussed with:   Anesthesia Plan Comments:         Anesthesia Quick Evaluation

## 2017-07-20 NOTE — Transfer of Care (Signed)
Immediate Anesthesia Transfer of Care Note  Patient: Terry Morrow  Procedure(s) Performed: COLONOSCOPY WITH PROPOFOL (N/A ) POLYPECTOMY  Patient Location: PACU  Anesthesia Type:MAC  Level of Consciousness: awake, alert  and patient cooperative  Airway & Oxygen Therapy: Patient Spontanous Breathing and Patient connected to nasal cannula oxygen  Post-op Assessment: Report given to RN and Post -op Vital signs reviewed and stable  Post vital signs: Reviewed and stable  Last Vitals:  Vitals:   07/20/17 1055 07/20/17 1100  BP: 102/65   Resp: 13 14  Temp:    SpO2: 96% 95%    Last Pain:  Vitals:   07/20/17 0914  TempSrc: Oral      Patients Stated Pain Goal: 8 (30/07/62 2633)  Complications: No apparent anesthesia complications

## 2017-07-20 NOTE — Anesthesia Postprocedure Evaluation (Signed)
Anesthesia Post Note  Patient: SKYLEN SPIERING  Procedure(s) Performed: COLONOSCOPY WITH PROPOFOL (N/A ) POLYPECTOMY  Patient location during evaluation: PACU Anesthesia Type: MAC Level of consciousness: awake and alert and patient cooperative Pain management: satisfactory to patient Vital Signs Assessment: post-procedure vital signs reviewed and stable Respiratory status: spontaneous breathing Cardiovascular status: stable Postop Assessment: no apparent nausea or vomiting Anesthetic complications: no     Last Vitals:  Vitals:   07/20/17 1200 07/20/17 1215  BP: 104/67 105/66  Pulse: 69 75  Resp: 11 13  Temp:    SpO2: 100% 100%    Last Pain:  Vitals:   07/20/17 0914  TempSrc: Oral                 Ilea Hilton

## 2017-07-20 NOTE — Op Note (Signed)
[facility] Patient Name: Terry Morrow Procedure Date: 07/20/2017 10:44 AM MRN: [id number] Date of Birth: [birth year] Attending MD: Norvel Richards , MD CSN: [id number] Age: 67 Admit Type: Outpatient Procedure:                Colonoscopy Indications:              Screening for colorectal malignant neoplasm Providers:                Norvel Richards, MD, Charlsie Quest. Theda Sers RN, RN,                            Rosina Lowenstein, RN Referring MD:              Medicines:                Propofol per Anesthesia Complications:            No immediate complications. Estimated Blood Loss:     Estimated blood loss was minimal. Procedure:                Pre-Anesthesia Assessment:                           - Prior to the procedure, a History and Physical                            was performed, and patient medications and                            allergies were reviewed. The patient's tolerance of                            previous anesthesia was also reviewed. The risks                            and benefits of the procedure and the sedation                            options and risks were discussed with the patient.                            All questions were answered, and informed consent                            was obtained. Prior Anticoagulants: The patient has                            taken no previous anticoagulant or antiplatelet                            agents. ASA Grade Assessment: II - A patient with                            mild systemic disease. After reviewing the risks  and benefits, the patient was deemed in                            satisfactory condition to undergo the procedure.                           After obtaining informed consent, the colonoscope                            was passed under direct vision. Throughout the                            procedure, the patient's blood pressure, pulse, and     oxygen saturations were monitored continuously. The                            EC-3890Li ([id number]) scope was introduced through                            the and advanced to the the cecum, identified by                            appendiceal orifice and ileocecal valve. The                            colonoscopy was performed without difficulty. The                            ileocecal valve, appendiceal orifice, and rectum                            were photographed. The quality of the bowel                            preparation was adequate. Scope In: 11:14:07 AM Scope Out: 11:36:32 AM Scope Withdrawal Time: 0 hours 17 minutes 5 seconds  Total Procedure Duration: 0 hours 22 minutes 25 seconds  Findings:      The perianal and digital rectal examinations were normal.      Scattered medium-mouthed diverticula were found in the sigmoid colon and       descending colon.      Three semi-pedunculated polyps were found in the descending colon,       hepatic flexure and cecum. The polyps were 7 to 9 mm in size. These       polyps were removed with a hot snare. Resection and retrieval were       complete. Estimated blood loss: none. Estimated blood loss: none.      Three semi-pedunculated polyps were found in the rectum and sigmoid       colon. The polyps were 3 to 5 mm in size. These polyps were removed with       a cold snare. Resection and retrieval were complete. Estimated blood       loss was minimal.      The exam was otherwise without abnormality on direct and retroflexion       views( multiple hyperplastic appearing mamillations  in the sigmoid       segment not manipulated). Impression:               - Diverticulosis in the sigmoid colon and in the                            descending colon.                           - Three 7 to 9 mm polyps in the descending colon,                            at the hepatic flexure and in the cecum, removed                            with a hot  snare. Resected and retrieved.                           - Three 3 to 5 mm polyps in the rectum and in the                            sigmoid colon, removed with a cold snare. Resected                            and retrieved.                           - The examination was otherwise normal on direct                            and retroflexion views. Moderate Sedation:      Moderate (conscious) sedation was personally administered by an       anesthesia professional. The following parameters were monitored: oxygen       saturation, heart rate, blood pressure, respiratory rate, EKG, adequacy       of pulmonary ventilation, and response to care. Total physician       intraservice time was 29 minutes. Recommendation:           - Patient has a contact number available for                            emergencies. The signs and symptoms of potential                            delayed complications were discussed with the                            patient. Return to normal activities tomorrow.                            Written discharge instructions were provided to the                            patient.                           -  Resume previous diet.                           - Continue present medications.                           - Repeat colonoscopy date to be determined after                            pending pathology results are reviewed for                            surveillance.                           - Return to GI clinic (date not yet determined). Procedure Code(s):        --- Professional ---                           [phone], Colonoscopy, flexible; with removal of                            tumor(s), polyp(s), or other lesion(s) by snare                            technique Diagnosis Code(s):        --- Professional ---                           Z12.11, Encounter for screening for malignant                            neoplasm of colon                           D12.4,  Benign neoplasm of descending colon                           D12.3, Benign neoplasm of transverse colon (hepatic                            flexure or splenic flexure)                           D12.0, Benign neoplasm of cecum                           K62.1, Rectal polyp                           D12.5, Benign neoplasm of sigmoid colon                           K57.30, Diverticulosis of large intestine without                            perforation or abscess without bleeding CPT copyright 2016 American  Medical Association. All rights reserved. The codes documented in this report are preliminary and upon coder review may  be revised to meet current compliance requirements. Cristopher Estimable. Terry Hagos, MD Norvel Richards, MD 07/20/2017 11:46:12 AM This report has been signed electronically. Number of Addenda: 0

## 2017-07-20 NOTE — Anesthesia Procedure Notes (Signed)
Procedure Name: MAC Date/Time: 07/20/2017 11:03 AM Performed by: Vista Deck, CRNA Pre-anesthesia Checklist: Patient identified, Emergency Drugs available, Suction available, Timeout performed and Patient being monitored Patient Re-evaluated:Patient Re-evaluated prior to induction Oxygen Delivery Method: Non-rebreather mask

## 2017-07-20 NOTE — Interval H&P Note (Signed)
History and Physical Interval Note:  07/20/2017 11:03 AM  Terry Morrow  has presented today for surgery, with the diagnosis of screening colonoscopy  The various methods of treatment have been discussed with the patient and family. After consideration of risks, benefits and other options for treatment, the patient has consented to  Procedure(s) with comments: COLONOSCOPY WITH PROPOFOL (N/A) - 1:00pm as a surgical intervention .  The patient's history has been reviewed, patient examined, no change in status, stable for surgery.  I have reviewed the patient's chart and labs.  Questions were answered to the patient's satisfaction.     No change. Screening colonoscopy today per plan.   The risks, benefits, limitations, alternatives and imponderables have been reviewed with the patient. Questions have been answered. All parties are agreeable.   Manus Rudd

## 2017-07-21 ENCOUNTER — Encounter (HOSPITAL_COMMUNITY): Payer: Self-pay | Admitting: Internal Medicine

## 2017-07-21 ENCOUNTER — Encounter: Payer: Self-pay | Admitting: Internal Medicine

## 2017-10-25 ENCOUNTER — Ambulatory Visit (HOSPITAL_COMMUNITY)
Admission: RE | Admit: 2017-10-25 | Discharge: 2017-10-25 | Disposition: A | Discharge status: Home / Self Care | Payer: Medicare Other | Source: Ambulatory Visit | Attending: Physician Assistant | Admitting: Physician Assistant

## 2017-10-25 ENCOUNTER — Other Ambulatory Visit (HOSPITAL_COMMUNITY): Payer: Self-pay | Admitting: Physician Assistant

## 2017-10-25 DIAGNOSIS — M19032 Primary osteoarthritis, left wrist: Secondary | ICD-10-CM | POA: Diagnosis not present

## 2017-10-25 DIAGNOSIS — Z6822 Body mass index (BMI) 22.0-22.9, adult: Secondary | ICD-10-CM | POA: Diagnosis not present

## 2017-10-25 DIAGNOSIS — M25532 Pain in left wrist: Secondary | ICD-10-CM

## 2017-10-25 DIAGNOSIS — M189 Osteoarthritis of first carpometacarpal joint, unspecified: Secondary | ICD-10-CM | POA: Diagnosis not present

## 2017-10-25 DIAGNOSIS — G44011 Episodic cluster headache, intractable: Secondary | ICD-10-CM | POA: Diagnosis not present

## 2017-10-25 DIAGNOSIS — M25432 Effusion, left wrist: Secondary | ICD-10-CM | POA: Diagnosis not present

## 2017-10-26 DIAGNOSIS — M25532 Pain in left wrist: Secondary | ICD-10-CM | POA: Diagnosis not present

## 2017-11-27 DIAGNOSIS — Z6822 Body mass index (BMI) 22.0-22.9, adult: Secondary | ICD-10-CM | POA: Diagnosis not present

## 2017-11-27 DIAGNOSIS — M1812 Unilateral primary osteoarthritis of first carpometacarpal joint, left hand: Secondary | ICD-10-CM | POA: Diagnosis not present

## 2017-11-27 DIAGNOSIS — J449 Chronic obstructive pulmonary disease, unspecified: Secondary | ICD-10-CM | POA: Diagnosis not present

## 2017-11-27 DIAGNOSIS — M19032 Primary osteoarthritis, left wrist: Secondary | ICD-10-CM | POA: Diagnosis not present

## 2017-12-06 DIAGNOSIS — Z0001 Encounter for general adult medical examination with abnormal findings: Secondary | ICD-10-CM | POA: Diagnosis not present

## 2017-12-06 DIAGNOSIS — Z6822 Body mass index (BMI) 22.0-22.9, adult: Secondary | ICD-10-CM | POA: Diagnosis not present

## 2017-12-06 DIAGNOSIS — Z1389 Encounter for screening for other disorder: Secondary | ICD-10-CM | POA: Diagnosis not present

## 2017-12-06 DIAGNOSIS — M13832 Other specified arthritis, left wrist: Secondary | ICD-10-CM | POA: Diagnosis not present

## 2017-12-12 DIAGNOSIS — M25532 Pain in left wrist: Secondary | ICD-10-CM | POA: Diagnosis not present

## 2017-12-13 DIAGNOSIS — M25532 Pain in left wrist: Secondary | ICD-10-CM | POA: Diagnosis not present

## 2017-12-15 DIAGNOSIS — M25432 Effusion, left wrist: Secondary | ICD-10-CM | POA: Diagnosis not present

## 2017-12-15 DIAGNOSIS — M25532 Pain in left wrist: Secondary | ICD-10-CM | POA: Diagnosis not present

## 2018-08-14 DIAGNOSIS — E785 Hyperlipidemia, unspecified: Secondary | ICD-10-CM | POA: Diagnosis not present

## 2018-08-14 DIAGNOSIS — F5101 Primary insomnia: Secondary | ICD-10-CM | POA: Diagnosis not present

## 2018-08-14 DIAGNOSIS — Z6823 Body mass index (BMI) 23.0-23.9, adult: Secondary | ICD-10-CM | POA: Diagnosis not present

## 2018-08-14 DIAGNOSIS — G894 Chronic pain syndrome: Secondary | ICD-10-CM | POA: Diagnosis not present

## 2018-12-04 ENCOUNTER — Ambulatory Visit (INDEPENDENT_AMBULATORY_CARE_PROVIDER_SITE_OTHER): Payer: Medicare Other | Admitting: Urology

## 2018-12-04 DIAGNOSIS — R3911 Hesitancy of micturition: Secondary | ICD-10-CM

## 2018-12-04 DIAGNOSIS — N401 Enlarged prostate with lower urinary tract symptoms: Secondary | ICD-10-CM | POA: Diagnosis not present

## 2018-12-10 DIAGNOSIS — Z1389 Encounter for screening for other disorder: Secondary | ICD-10-CM | POA: Diagnosis not present

## 2018-12-10 DIAGNOSIS — E785 Hyperlipidemia, unspecified: Secondary | ICD-10-CM | POA: Diagnosis not present

## 2018-12-10 DIAGNOSIS — G894 Chronic pain syndrome: Secondary | ICD-10-CM | POA: Diagnosis not present

## 2018-12-10 DIAGNOSIS — Z0001 Encounter for general adult medical examination with abnormal findings: Secondary | ICD-10-CM | POA: Diagnosis not present

## 2018-12-10 DIAGNOSIS — Z6822 Body mass index (BMI) 22.0-22.9, adult: Secondary | ICD-10-CM | POA: Diagnosis not present

## 2018-12-10 DIAGNOSIS — Z0000 Encounter for general adult medical examination without abnormal findings: Secondary | ICD-10-CM | POA: Diagnosis not present

## 2018-12-18 ENCOUNTER — Other Ambulatory Visit: Payer: Self-pay | Admitting: Urology

## 2018-12-18 ENCOUNTER — Other Ambulatory Visit (HOSPITAL_COMMUNITY): Payer: Self-pay | Admitting: Urology

## 2018-12-18 DIAGNOSIS — N138 Other obstructive and reflux uropathy: Secondary | ICD-10-CM

## 2018-12-21 ENCOUNTER — Other Ambulatory Visit: Payer: Self-pay

## 2018-12-21 ENCOUNTER — Ambulatory Visit (HOSPITAL_COMMUNITY)
Admission: RE | Admit: 2018-12-21 | Discharge: 2018-12-21 | Disposition: A | Discharge status: Home / Self Care | Payer: Medicare Other | Source: Ambulatory Visit | Attending: Urology | Admitting: Urology

## 2018-12-21 DIAGNOSIS — N401 Enlarged prostate with lower urinary tract symptoms: Secondary | ICD-10-CM | POA: Diagnosis not present

## 2018-12-21 DIAGNOSIS — N138 Other obstructive and reflux uropathy: Secondary | ICD-10-CM

## 2019-01-03 ENCOUNTER — Other Ambulatory Visit: Payer: Self-pay | Admitting: Urology

## 2019-01-07 NOTE — Patient Instructions (Signed)
Terry Morrow  01/07/2019     @PREFPERIOPPHARMACY @   Your procedure is scheduled on  01/15/2019  Report to [facility] at  615   A.M.  Call this number if you have problems the morning of surgery:  [phone]   Remember:  Do not eat or drink after midnight.                         Take these medicines the morning of surgery with A SIP OF WATER  Hydrocodone, metoprolol, imitrex. Use your inhaler and your nebulizer before you come.    Do not wear jewelry, make-up or nail polish.  Do not wear lotions, powders, or perfumes. Please wear deodorant and brush your teeth.  Do not shave 48 hours prior to surgery.  Men may shave face and neck.  Do not bring valuables to the hospital.  [facility] is not responsible for any belongings or valuables.  Contacts, dentures or bridgework may not be worn into surgery.  Leave your suitcase in the car.  After surgery it may be brought to your room.  For patients admitted to the hospital, discharge time will be determined by your treatment team.  Patients discharged the day of surgery will not be allowed to drive home.   Name and phone number of your driver:  family Special instructions:  None  Please read over the following fact sheets that you were given. Anesthesia Post-op Instructions and Care and Recovery After Surgery       Cystoscopy Cystoscopy is a procedure that is used to help diagnose and sometimes treat conditions that affect the lower urinary tract. The lower urinary tract includes the bladder and the urethra. The urethra is the tube that drains urine from the bladder. Cystoscopy is done using a thin, tube-shaped instrument with a light and camera at the end (cystoscope). The cystoscope may be hard or flexible, depending on the goal of the procedure. The cystoscope is inserted through the urethra, into the bladder. Cystoscopy may be recommended if you have:  Urinary tract infections that keep coming back.  Blood in the urine  (hematuria).  An inability to control when you urinate (urinary incontinence) or an overactive bladder.  Unusual cells found in a urine sample.  A blockage in the urethra, such as a urinary stone.  Painful urination.  An abnormality in the bladder found during an intravenous pyelogram (IVP) or CT scan. Cystoscopy may also be done to remove a sample of tissue to be examined under a microscope (biopsy). Tell a health care provider about:  Any allergies you have.  All medicines you are taking, including vitamins, herbs, eye drops, creams, and over-the-counter medicines.  Any problems you or family members have had with anesthetic medicines.  Any blood disorders you have.  Any surgeries you have had.  Any medical conditions you have.  Whether you are pregnant or may be pregnant. What are the risks? Generally, this is a safe procedure. However, problems may occur, including:  Infection.  Bleeding.  Allergic reactions to medicines.  Damage to other structures or organs. What happens before the procedure?  Ask your health care provider about: ? Changing or stopping your regular medicines. This is especially important if you are taking diabetes medicines or blood thinners. ? Taking medicines such as aspirin and ibuprofen. These medicines can thin your blood. Do not take these medicines unless your health care provider tells you to take them. ?  Taking over-the-counter medicines, vitamins, herbs, and supplements.  Follow instructions from your health care provider about eating or drinking restrictions.  Ask your health care provider what steps will be taken to help prevent infection. These may include: ? Washing skin with a germ-killing soap. ? Taking antibiotic medicine.  You may have an exam or testing, such as: ? X-rays of the bladder, urethra, or kidneys. ? Urine tests to check for signs of infection.  Plan to have someone take you home from the hospital or clinic.  What happens during the procedure?   You will be given one or more of the following: ? A medicine to help you relax (sedative). ? A medicine to numb the area (local anesthetic).  The area around the opening of your urethra will be cleaned.  The cystoscope will be passed through your urethra into your bladder.  Germ-free (sterile) fluid will flow through the cystoscope to fill your bladder. The fluid will stretch your bladder so that your health care provider can clearly examine your bladder walls.  Your doctor will look at the urethra and bladder. Your doctor may take a biopsy or remove stones.  The cystoscope will be removed, and your bladder will be emptied. The procedure may vary among health care providers and hospitals. What can I expect after the procedure? After the procedure, it is common to have:  Some soreness or pain in your abdomen and urethra.  Urinary symptoms. These include: ? Mild pain or burning when you urinate. Pain should stop within a few minutes after you urinate. This may last for up to 1 week. ? A small amount of blood in your urine for several days. ? Feeling like you need to urinate but producing only a small amount of urine. Follow these instructions at home: Medicines  Take over-the-counter and prescription medicines only as told by your health care provider.  If you were prescribed an antibiotic medicine, take it as told by your health care provider. Do not stop taking the antibiotic even if you start to feel better. General instructions  Return to your normal activities as told by your health care provider. Ask your health care provider what activities are safe for you.  Do not drive for 24 hours if you were given a sedative during your procedure.  Watch for any blood in your urine. If the amount of blood in your urine increases, call your health care provider.  Follow instructions from your health care provider about eating or drinking  restrictions.  If a tissue sample was removed for testing (biopsy) during your procedure, it is up to you to get your test results. Ask your health care provider, or the department that is doing the test, when your results will be ready.  Drink enough fluid to keep your urine pale yellow.  Keep all follow-up visits as told by your health care provider. This is important. Contact a health care provider if you:  Have pain that gets worse or does not get better with medicine, especially pain when you urinate.  Have trouble urinating.  Have more blood in your urine. Get help right away if you:  Have blood clots in your urine.  Have abdominal pain.  Have a fever or chills.  Are unable to urinate. Summary  Cystoscopy is a procedure that is used to help diagnose and sometimes treat conditions that affect the lower urinary tract.  Cystoscopy is done using a thin, tube-shaped instrument with a light and camera at the  end.  After the procedure, it is common to have some soreness or pain in your abdomen and urethra.  Watch for any blood in your urine. If the amount of blood in your urine increases, call your health care provider.  If you were prescribed an antibiotic medicine, take it as told by your health care provider. Do not stop taking the antibiotic even if you start to feel better. This information is not intended to replace advice given to you by your health care provider. Make sure you discuss any questions you have with your health care provider. Document Released: 05/20/2000 Document Revised: 05/15/2018 Document Reviewed: 05/15/2018 Elsevier Patient Education  2020 [place] After These instructions provide you with information about caring for yourself after your procedure. Your health care provider may also give you more specific instructions. Your treatment has been planned according to current medical practices, but problems sometimes  occur. Call your health care provider if you have any problems or questions after your procedure. What can I expect after the procedure? After your procedure, you may:  Feel sleepy for several hours.  Feel clumsy and have poor balance for several hours.  Feel forgetful about what happened after the procedure.  Have poor judgment for several hours.  Feel nauseous or vomit.  Have a sore throat if you had a breathing tube during the procedure. Follow these instructions at home: For at least 24 hours after the procedure:      Have a responsible adult stay with you. It is important to have someone help care for you until you are awake and alert.  Rest as needed.  Do not: ? Participate in activities in which you could fall or become injured. ? Drive. ? Use heavy machinery. ? Drink alcohol. ? Take sleeping pills or medicines that cause drowsiness. ? Make important decisions or sign legal documents. ? Take care of children on your own. Eating and drinking  Follow the diet that is recommended by your health care provider.  If you vomit, drink water, juice, or soup when you can drink without vomiting.  Make sure you have little or no nausea before eating solid foods. General instructions  Take over-the-counter and prescription medicines only as told by your health care provider.  If you have sleep apnea, surgery and certain medicines can increase your risk for breathing problems. Follow instructions from your health care provider about wearing your sleep device: ? Anytime you are sleeping, including during daytime naps. ? While taking prescription pain medicines, sleeping medicines, or medicines that make you drowsy.  If you smoke, do not smoke without supervision.  Keep all follow-up visits as told by your health care provider. This is important. Contact a health care provider if:  You keep feeling nauseous or you keep vomiting.  You feel light-headed.  You develop a  rash.  You have a fever. Get help right away if:  You have trouble breathing. Summary  For several hours after your procedure, you may feel sleepy and have poor judgment.  Have a responsible adult stay with you for at least 24 hours or until you are awake and alert. This information is not intended to replace advice given to you by your health care provider. Make sure you discuss any questions you have with your health care provider. Document Released: 09/13/2015 Document Revised: 08/21/2017 Document Reviewed: 09/13/2015 Elsevier Patient Education  2020 [employer].

## 2019-01-11 ENCOUNTER — Encounter (HOSPITAL_COMMUNITY)
Admission: RE | Admit: 2019-01-11 | Discharge: 2019-01-11 | Disposition: A | Discharge status: Home / Self Care | Payer: Medicare Other | Source: Ambulatory Visit | Attending: Urology | Admitting: Urology

## 2019-01-11 ENCOUNTER — Other Ambulatory Visit: Payer: Self-pay

## 2019-01-11 ENCOUNTER — Encounter (HOSPITAL_COMMUNITY): Payer: Self-pay

## 2019-01-11 ENCOUNTER — Other Ambulatory Visit (HOSPITAL_COMMUNITY)
Admission: RE | Admit: 2019-01-11 | Discharge: 2019-01-11 | Disposition: A | Discharge status: Home / Self Care | Payer: Medicare Other | Source: Ambulatory Visit | Attending: Urology | Admitting: Urology

## 2019-01-11 DIAGNOSIS — Z01818 Encounter for other preprocedural examination: Secondary | ICD-10-CM | POA: Diagnosis not present

## 2019-01-11 DIAGNOSIS — Z20828 Contact with and (suspected) exposure to other viral communicable diseases: Secondary | ICD-10-CM | POA: Insufficient documentation

## 2019-01-11 LAB — SARS CORONAVIRUS 2 (TAT 6-24 HRS): SARS Coronavirus 2: NEGATIVE

## 2019-01-15 ENCOUNTER — Ambulatory Visit (HOSPITAL_COMMUNITY)
Admission: RE | Admit: 2019-01-15 | Discharge: 2019-01-15 | Disposition: A | Discharge status: Home / Self Care | Payer: Medicare Other | Attending: Urology | Admitting: Urology

## 2019-01-15 ENCOUNTER — Encounter (HOSPITAL_COMMUNITY)
Admission: RE | Disposition: A | Discharge status: Home / Self Care | Payer: Self-pay | Source: Home / Self Care | Attending: Urology

## 2019-01-15 ENCOUNTER — Encounter (HOSPITAL_COMMUNITY): Payer: Self-pay

## 2019-01-15 ENCOUNTER — Ambulatory Visit (HOSPITAL_COMMUNITY): Payer: Medicare Other | Admitting: Anesthesiology

## 2019-01-15 ENCOUNTER — Other Ambulatory Visit: Payer: Self-pay

## 2019-01-15 DIAGNOSIS — N400 Enlarged prostate without lower urinary tract symptoms: Secondary | ICD-10-CM | POA: Diagnosis not present

## 2019-01-15 DIAGNOSIS — J439 Emphysema, unspecified: Secondary | ICD-10-CM | POA: Diagnosis not present

## 2019-01-15 DIAGNOSIS — N138 Other obstructive and reflux uropathy: Secondary | ICD-10-CM | POA: Diagnosis not present

## 2019-01-15 DIAGNOSIS — G43909 Migraine, unspecified, not intractable, without status migrainosus: Secondary | ICD-10-CM | POA: Diagnosis not present

## 2019-01-15 DIAGNOSIS — F17210 Nicotine dependence, cigarettes, uncomplicated: Secondary | ICD-10-CM | POA: Diagnosis not present

## 2019-01-15 DIAGNOSIS — Z87442 Personal history of urinary calculi: Secondary | ICD-10-CM | POA: Insufficient documentation

## 2019-01-15 DIAGNOSIS — Z20828 Contact with and (suspected) exposure to other viral communicable diseases: Secondary | ICD-10-CM | POA: Insufficient documentation

## 2019-01-15 DIAGNOSIS — K219 Gastro-esophageal reflux disease without esophagitis: Secondary | ICD-10-CM | POA: Insufficient documentation

## 2019-01-15 DIAGNOSIS — G4700 Insomnia, unspecified: Secondary | ICD-10-CM | POA: Insufficient documentation

## 2019-01-15 DIAGNOSIS — N401 Enlarged prostate with lower urinary tract symptoms: Secondary | ICD-10-CM | POA: Diagnosis not present

## 2019-01-15 DIAGNOSIS — I10 Essential (primary) hypertension: Secondary | ICD-10-CM | POA: Diagnosis not present

## 2019-01-15 HISTORY — PX: CYSTOSCOPY WITH INSERTION OF UROLIFT: SHX6678

## 2019-01-15 SURGERY — CYSTOSCOPY WITH INSERTION OF UROLIFT
Anesthesia: Monitor Anesthesia Care

## 2019-01-15 MED ORDER — HYDROCODONE-ACETAMINOPHEN 7.5-325 MG PO TABS: 1.0000 | ORAL_TABLET | Freq: Once | ORAL | Status: DC | PRN

## 2019-01-15 MED ORDER — LIDOCAINE HCL URETHRAL/MUCOSAL 2 % EX GEL
CUTANEOUS | Status: DC | PRN
  Administered 2019-01-15: 1 via URETHRAL

## 2019-01-15 MED ORDER — MIDAZOLAM HCL 5 MG/5ML IJ SOLN
INTRAMUSCULAR | Status: DC | PRN
  Administered 2019-01-15: 1 mg via INTRAVENOUS

## 2019-01-15 MED ORDER — ROCURONIUM BROMIDE 10 MG/ML (PF) SYRINGE
PREFILLED_SYRINGE | INTRAVENOUS | Status: AC
  Filled 2019-01-15: qty 10

## 2019-01-15 MED ORDER — EPHEDRINE 5 MG/ML INJ
INTRAVENOUS | Status: AC
  Filled 2019-01-15: qty 10

## 2019-01-15 MED ORDER — LIDOCAINE HCL URETHRAL/MUCOSAL 2 % EX GEL
CUTANEOUS | Status: AC
  Filled 2019-01-15: qty 10

## 2019-01-15 MED ORDER — PROPOFOL 10 MG/ML IV BOLUS
INTRAVENOUS | Status: AC
  Filled 2019-01-15: qty 40

## 2019-01-15 MED ORDER — MIDAZOLAM HCL 2 MG/2ML IJ SOLN
INTRAMUSCULAR | Status: AC
  Filled 2019-01-15: qty 2

## 2019-01-15 MED ORDER — MIDAZOLAM HCL 2 MG/2ML IJ SOLN: 0.5000 mg | Freq: Once | INTRAMUSCULAR | Status: DC | PRN

## 2019-01-15 MED ORDER — LACTATED RINGERS IV SOLN
INTRAVENOUS | Status: DC
  Administered 2019-01-15: 07:00:00 via INTRAVENOUS

## 2019-01-15 MED ORDER — PROMETHAZINE HCL 25 MG/ML IJ SOLN: 6.2500 mg | INTRAMUSCULAR | Status: DC | PRN

## 2019-01-15 MED ORDER — FENTANYL CITRATE (PF) 100 MCG/2ML IJ SOLN
INTRAMUSCULAR | Status: AC
  Filled 2019-01-15: qty 2

## 2019-01-15 MED ORDER — LIDOCAINE 2% (20 MG/ML) 5 ML SYRINGE
INTRAMUSCULAR | Status: AC
  Filled 2019-01-15: qty 5

## 2019-01-15 MED ORDER — ONDANSETRON HCL 4 MG/2ML IJ SOLN
INTRAMUSCULAR | Status: DC | PRN
  Administered 2019-01-15: 4 mg via INTRAVENOUS

## 2019-01-15 MED ORDER — PHENYLEPHRINE 40 MCG/ML (10ML) SYRINGE FOR IV PUSH (FOR BLOOD PRESSURE SUPPORT)
PREFILLED_SYRINGE | INTRAVENOUS | Status: AC
  Filled 2019-01-15: qty 10

## 2019-01-15 MED ORDER — FENTANYL CITRATE (PF) 100 MCG/2ML IJ SOLN
INTRAMUSCULAR | Status: DC | PRN
  Administered 2019-01-15: 25 ug via INTRAVENOUS

## 2019-01-15 MED ORDER — EPHEDRINE SULFATE 50 MG/ML IJ SOLN
INTRAMUSCULAR | Status: DC | PRN
  Administered 2019-01-15 (×2): 10 mg via INTRAVENOUS
  Administered 2019-01-15: 5 mg via INTRAVENOUS

## 2019-01-15 MED ORDER — SUCCINYLCHOLINE CHLORIDE 200 MG/10ML IV SOSY
PREFILLED_SYRINGE | INTRAVENOUS | Status: AC
  Filled 2019-01-15: qty 10

## 2019-01-15 MED ORDER — KETAMINE HCL 50 MG/5ML IJ SOSY
PREFILLED_SYRINGE | INTRAMUSCULAR | Status: AC
  Filled 2019-01-15: qty 5

## 2019-01-15 MED ORDER — CEFAZOLIN SODIUM-DEXTROSE 2-4 GM/100ML-% IV SOLN
2.0000 g | INTRAVENOUS | Status: AC
  Administered 2019-01-15: 2 g via INTRAVENOUS
  Filled 2019-01-15: qty 100

## 2019-01-15 MED ORDER — PROPOFOL 500 MG/50ML IV EMUL
INTRAVENOUS | Status: DC | PRN
  Administered 2019-01-15: 75 ug/kg/min via INTRAVENOUS

## 2019-01-15 MED ORDER — GLYCOPYRROLATE PF 0.2 MG/ML IJ SOSY
PREFILLED_SYRINGE | INTRAMUSCULAR | Status: AC
  Filled 2019-01-15: qty 1

## 2019-01-15 MED ORDER — WATER FOR IRRIGATION, STERILE IR SOLN
Status: DC | PRN
  Administered 2019-01-15: 3000 mL
  Administered 2019-01-15: 500 mL

## 2019-01-15 MED ORDER — HYDROMORPHONE HCL 1 MG/ML IJ SOLN: 0.2500 mg | INTRAMUSCULAR | Status: DC | PRN

## 2019-01-15 MED ORDER — ONDANSETRON HCL 4 MG/2ML IJ SOLN
INTRAMUSCULAR | Status: AC
  Filled 2019-01-15: qty 4

## 2019-01-15 MED ORDER — PROPOFOL 10 MG/ML IV BOLUS
INTRAVENOUS | Status: DC | PRN
  Administered 2019-01-15 (×3): 20 mg via INTRAVENOUS

## 2019-01-15 SURGICAL SUPPLY — 16 items
BAG DRAIN URO TABLE W/ADPT NS (BAG) ×2 IMPLANT
BAG DRN 8 ADPR NS SKTRN CSTL (BAG) ×1
CLOTH BEACON ORANGE TIMEOUT ST (SAFETY) ×2 IMPLANT
GLOVE BIO SURGEON STRL SZ8 (GLOVE) ×2 IMPLANT
GLOVE BIOGEL PI IND STRL 7.0 (GLOVE) ×2 IMPLANT
GLOVE BIOGEL PI INDICATOR 7.0 (GLOVE) ×2
GOWN STRL REUS W/TWL LRG LVL3 (GOWN DISPOSABLE) ×2 IMPLANT
GOWN STRL REUS W/TWL XL LVL3 (GOWN DISPOSABLE) ×2 IMPLANT
KIT TURNOVER CYSTO (KITS) ×2 IMPLANT
MANIFOLD NEPTUNE II (INSTRUMENTS) ×2 IMPLANT
PACK CYSTO (CUSTOM PROCEDURE TRAY) ×2 IMPLANT
PAD ARMBOARD 7.5X6 YLW CONV (MISCELLANEOUS) ×2 IMPLANT
SYSTEM UROLIFT (Male Continence) ×5 IMPLANT
TOWEL OR 17X26 4PK STRL BLUE (TOWEL DISPOSABLE) ×2 IMPLANT
WATER STERILE IRR 3000ML UROMA (IV SOLUTION) ×2 IMPLANT
WATER STERILE IRR 500ML POUR (IV SOLUTION) ×2 IMPLANT

## 2019-01-15 NOTE — Op Note (Signed)
Preoperative diagnosis: BPH with obstructive symptomatology.  Postoperative diagnosis: Same  Principal procedure: Urolift procedure, with the placement of 5 implants.  Surgeon: Diona Fanti  Anesthesia: MAC  Complications: None  Drains: None  Estimated blood loss: Less than 25 mL  Indications: 68 year old male with obstructive symptomatology secondary to BPH.  The patient's symptoms have progressed, and he has requested further management.  Management options including TURP with resection/ablation of the prostate as well as Urolift were discussed.  The patient has chosen to have a Urolift procedure.  He has been instructed to the procedure as well as risks and complications which include but are not limited to infection, bleeding, and inadequate treatment with the Urolift procedure alone, anesthetic complications, among others.  He understands these and desires to proceed.  Findings: Using the 17 French cystoscope, urethra and bladder were inspected.  There were no urethral lesions.  Prostatic urethra was obstructed secondary to bilobar hypertrophy.  The bladder was inspected circumferentially.  This revealed normal findings.  Description of procedure: The patient was properly identified in the holding area.  He received preoperative IV antibiotics.  He was taken to the operating room where MAC was administered.  He is placed in the dorsolithotomy position.  Genitalia and perineum were prepped and draped.  Proper timeout was performed.  A 44F cystoscope was inserted into the bladder. The cystoscopy bridge was replaced with a UroLift delivery device.The first treatment site was the patient's right side approximately 1.5cm distal to the bladder neck. The distal tip of the delivery device was then angled laterally approximately 20 degrees at this position to compress the lateral lobe. The trigger was pulled, thereby deploying a needle containing the implant through the prostate. The needle was then  retracted, allowing one end of the implant to be delivered to the capsular surface of the prostate. The implant was then tensioned to assure capsular seating and removal of slack monofilament. The device was then angled back toward midline and slowly advanced proximally until cystoscopic verification of the monofilament being centered in the delivery bay. The urethral end piece was then affixed to the monofilament thereby tailoring the size of the implant. Excess filament was then severed. The delivery device was then re-advanced into the bladder. The delivery device was then replaced with cystoscope and bridge and the implant location and opening effect was confirmed cystoscopically. The same procedure was then repeated on the left side, and 2 additional implants were delivered just proximal to the verumontanum, again one on right and one on left side of the prostate, following the same technique.  Cystoscopic inspection of the prostatic urethra revealed 1 bulge in the left mid prostatic lobe.  This was also treated with a fifth implant   A final cystoscopy was conducted first to inspect the location and state of each implant and second, to confirm the presence of a continuous anterior channel was present through the prostatic urethra with irrigation flow turned off.  5 implants were delivered in total.  As there was excellent anterior channel seen and there was no significant bleeding, no catheter was left in.  He was then awakened and taken to the PACU in stable condition.  He tolerated the procedure well.

## 2019-01-15 NOTE — Interval H&P Note (Signed)
History and Physical Interval Note:  01/15/2019 7:21 AM  Terry Morrow  has presented today for surgery, with the diagnosis of BENIGN PROSTATIC HYPERPLASIA.  The various methods of treatment have been discussed with the patient and family. After consideration of risks, benefits and other options for treatment, the patient has consented to  Procedure(s): CYSTOSCOPY WITH INSERTION OF UROLIFT (N/A) as a surgical intervention.  The patient's history has been reviewed, patient examined, no change in status, stable for surgery.  I have reviewed the patient's chart and labs.  Questions were answered to the patient's satisfaction.     Lillette Boxer Ericha Whittingham

## 2019-01-15 NOTE — H&P (Signed)
H&P  Chief Complaint: Difficulty uirinating  History of Present Illness: 68 yo male w/ symptomatic BPH presents for Urolift procedure.  Past Medical History:  Diagnosis Date  . GERD (gastroesophageal reflux disease)   . Headache(784.0)    MIGRAINE  --  LAST ONE ACOUPLE DAYS AGO  . History of kidney stones yrs ago  . Hypertension   . Insomnia   . Mild emphysema ([place])   . Neuromuscular disorder (HCC)    neuropathy improved since off depakote  . PONV (postoperative nausea and vomiting)   . Prostate disorder    enlarged  . Shortness of breath    with over exertion    Past Surgical History:  Procedure Laterality Date  . BONE ANCHORED HEARING AID IMPLANT Left 11/15/2012   Procedure: LEFT BONE ANCHORED HEARING AID (BAHA) IMPLANT;  Surgeon: Izora Gala, MD;  Location: [place];  Service: ENT;  Laterality: Left;  . COLONOSCOPY WITH PROPOFOL N/A 07/20/2017   Procedure: COLONOSCOPY WITH PROPOFOL;  Surgeon: Daneil Dolin, MD;  [facility];  Service: Endoscopy;  Laterality: N/A;  1:00pm  . CYSTECTOMY     larynx  . ELBOW SURGERY Bilateral    x 2 on both  . ESOPHAGOGASTRODUODENOSCOPY  2011   Dr. Gala Romney: Empiric dilation for history of dysphagia, hiatal hernia  . EYE SURGERY     BIL  CATARACTS  . INNER EAR SURGERY     x 2 on left  . KNEE SURGERY Bilateral    x 2 both knees arthroscopic  . MASTOIDECTOMY     left  . NECK SURGERY     x 5 stiff at times limited neck rom  . POLYPECTOMY  07/20/2017   Procedure: POLYPECTOMY;  Surgeon: Daneil Dolin, MD;  [facility];  Service: Endoscopy;;  Ileo-cecal valve (HS) x 1; Hepatic flexure(HS) x1; Descending colon(HS) x2  . ROTATOR CUFF REPAIR Right   . WRIST SURGERY     left    Home Medications:    Allergies: No Known Allergies  History reviewed. No pertinent family history.  Social History:  reports that he has been smoking cigarettes. He has a 38.00 pack-year smoking history. He has never used smokeless tobacco.  He reports that he does not drink alcohol or use drugs.  ROS: A complete review of systems was performed.  All systems are negative except for pertinent findings as noted.  Physical Exam:  Vital signs in last 24 hours: Temp:  [97.9 F (36.6 C)] 97.9 F (36.6 C) (08/11 0711) Pulse Rate:  [59] 59 (08/11 0711) Resp:  [16] 16 (08/11 0711) BP: (94)/(58) 94/58 (08/11 0711) SpO2:  [100 %] 100 % (08/11 0711) Constitutional:  Alert and oriented, No acute distress Cardiovascular: Regular rate  Respiratory: Normal respiratory effort GI: Abdomen is soft, nontender, nondistended, no abdominal masses. No CVAT.  Genitourinary: Normal male phallus, testes are descended bilaterally and non-tender and without masses, scrotum is normal in appearance without lesions or masses, perineum is normal on inspection. Lymphatic: No lymphadenopathy Neurologic: Grossly intact, no focal deficits Psychiatric: Normal mood and affect  Laboratory Data:  No results for input(s): WBC, HGB, HCT, PLT in the last 72 hours.  No results for input(s): NA, K, CL, GLUCOSE, BUN, CALCIUM, CREATININE in the last 72 hours.  Invalid input(s): CO3   No results found for this or any previous visit (from the past 24 hour(s)). Recent Results (from the past 240 hour(s))  SARS CORONAVIRUS 2 Nasal Swab Aptima Multi Swab  Status: None   Collection Time: 01/11/19  7:04 AM   Specimen: Aptima Multi Swab; Nasal Swab  Result Value Ref Range Status   SARS Coronavirus 2 NEGATIVE NEGATIVE Final    Comment: (NOTE) SARS-CoV-2 target nucleic acids are NOT DETECTED. The SARS-CoV-2 RNA is generally detectable in upper and lower respiratory specimens during the acute phase of infection. Negative results do not preclude SARS-CoV-2 infection, do not rule out co-infections with other pathogens, and should not be used as the sole basis for treatment or other patient management decisions. Negative results must be combined with clinical  observations, patient history, and epidemiological information. The expected result is Negative. Fact Sheet for Patients: SugarRoll.be Fact Sheet for Healthcare Providers: https://www.woods-mathews.com/ This test is not yet approved or cleared by the [place] FDA and  has been authorized for detection and/or diagnosis of SARS-CoV-2 by FDA under an Emergency Use Authorization (EUA). This EUA will remain  in effect (meaning this test can be used) for the duration of the COVID-19 declaration under Section 56 4(b)(1) of the Act, 21 U.S.C. section 360bbb-3(b)(1), unless the authorization is terminated or revoked sooner. Performed at [facility], [place] [address] [place],  [address]     Renal Function: No results for input(s): CREATININE in the last 168 hours. CrCl cannot be calculated (Patient's most recent lab result is older than the maximum 21 days allowed.).  Radiologic Imaging: No results found.  Impression/Assessment:  BPH w/ obstructive symptomatology  Plan:  Urolift procedure

## 2019-01-15 NOTE — Anesthesia Postprocedure Evaluation (Signed)
Anesthesia Post Note  Patient: Terry Morrow  Procedure(s) Performed: CYSTOSCOPY WITH INSERTION OF UROLIFT (N/A )  Patient location during evaluation: PACU Anesthesia Type: MAC Level of consciousness: awake and alert and oriented Pain management: pain level controlled Vital Signs Assessment: post-procedure vital signs reviewed and stable Respiratory status: spontaneous breathing Cardiovascular status: stable and blood pressure returned to baseline Postop Assessment: no apparent nausea or vomiting Anesthetic complications: no     Last Vitals:  Vitals:   01/15/19 0711 01/15/19 0800  BP: (!) 94/58 93/62  Pulse: (!) 59 69  Resp: 16 14  Temp: 36.6 C (P) 36.4 C  SpO2: 100% (P) 100%    Last Pain:  Vitals:   01/15/19 0711  TempSrc: Oral  PainSc: 0-No pain                 Sharrie Self

## 2019-01-15 NOTE — Transfer of Care (Signed)
Immediate Anesthesia Transfer of Care Note  Patient: Terry Morrow  Procedure(s) Performed: CYSTOSCOPY WITH INSERTION OF UROLIFT (N/A )  Patient Location: PACU  Anesthesia Type:MAC  Level of Consciousness: awake  Airway & Oxygen Therapy: Patient Spontanous Breathing  Post-op Assessment: Report given to RN  Post vital signs: Reviewed and stable  Last Vitals:  Vitals Value Taken Time  BP 93/62 01/15/19 0800  Temp    Pulse 65 01/15/19 0804  Resp 14 01/15/19 0804  SpO2 98 % 01/15/19 0804  Vitals shown include unvalidated device data.  Last Pain:  Vitals:   01/15/19 0711  TempSrc: Oral  PainSc: 0-No pain      Patients Stated Pain Goal: 7 (12/75/17 0017)  Complications: No apparent anesthesia complications

## 2019-01-15 NOTE — Anesthesia Preprocedure Evaluation (Signed)
Anesthesia Evaluation  Patient identified by MRN, date of birth, ID band Patient awake    Reviewed: Allergy & Precautions, NPO status , Patient's Chart, lab work & pertinent test results, reviewed documented beta blocker date and time   History of Anesthesia Complications (+) PONV and history of anesthetic complications  Airway Mallampati: I  TM Distance: >3 FB Neck ROM: Full    Dental no notable dental hx. (+) Edentulous Upper, Edentulous Lower   Pulmonary shortness of breath and with exertion, COPD,  COPD inhaler, Current Smoker and Patient abstained from smoking.,    Pulmonary exam normal breath sounds clear to auscultation       Cardiovascular Exercise Tolerance: Good hypertension, Pt. on medications and Pt. on home beta blockers negative cardio ROS Normal cardiovascular examI Rhythm:Regular Rate:Normal     Neuro/Psych  Headaches,  Neuromuscular disease negative psych ROS   GI/Hepatic Neg liver ROS, GERD  Medicated and Controlled,  Endo/Other  negative endocrine ROS  Renal/GU negative Renal ROS  negative genitourinary   Musculoskeletal negative musculoskeletal ROS (+)   Abdominal   Peds negative pediatric ROS (+)  Hematology negative hematology ROS (+)   Anesthesia Other Findings   Reproductive/Obstetrics negative OB ROS                             Anesthesia Physical Anesthesia Plan  ASA: III  Anesthesia Plan: MAC   Post-op Pain Management:    Induction: Intravenous  PONV Risk Score and Plan: 2 and Propofol infusion, TIVA, Treatment may vary due to age or medical condition, Ondansetron and Dexamethasone  Airway Management Planned: Nasal Cannula and Simple Face Mask  Additional Equipment:   Intra-op Plan:   Post-operative Plan:   Informed Consent: I have reviewed the patients History and Physical, chart, labs and discussed the procedure including the risks, benefits  and alternatives for the proposed anesthesia with the patient or authorized representative who has indicated his/her understanding and acceptance.     Dental advisory given  Plan Discussed with: CRNA  Anesthesia Plan Comments: (Plan full PPE use  Mac requested -pt WTP with MAC  D/w pt GA vs GETA as needed  Pt requests anti nausea meds)        Anesthesia Quick Evaluation

## 2019-01-15 NOTE — Discharge Instructions (Signed)
1.  Expect to see some bloody urethral drainage as well as blood in the initial part of your urinary stream for a few days  2.  If you are on a medicine for your prostate i.e. tamsulosin, alfuzosin, doxazosin or terazosin, it is okay to stop that 2-3 days after your procedure  3.  If you are on finasteride, it is okay to stop that immediately  4.  Limit your exertional activity for approximately 2 weeks after the procedure.  If you are having no bloody drainage or pain at that point, you may liberalize your activities  5.  Keep your follow-up appointment that is scheduled.  If you have problems before the appointment such as continued large clots in your urine, difficulty urinating or fever, contact us before at [phone]  

## 2019-01-16 ENCOUNTER — Other Ambulatory Visit: Payer: Self-pay | Admitting: Internal Medicine

## 2019-01-16 ENCOUNTER — Encounter (HOSPITAL_COMMUNITY): Payer: Self-pay | Admitting: Urology

## 2019-02-26 ENCOUNTER — Ambulatory Visit (INDEPENDENT_AMBULATORY_CARE_PROVIDER_SITE_OTHER): Payer: Medicare Other | Admitting: Urology

## 2019-02-26 DIAGNOSIS — R3911 Hesitancy of micturition: Secondary | ICD-10-CM

## 2019-02-26 DIAGNOSIS — N401 Enlarged prostate with lower urinary tract symptoms: Secondary | ICD-10-CM | POA: Diagnosis not present

## 2019-04-18 DIAGNOSIS — G44011 Episodic cluster headache, intractable: Secondary | ICD-10-CM | POA: Diagnosis not present

## 2019-04-18 DIAGNOSIS — M19032 Primary osteoarthritis, left wrist: Secondary | ICD-10-CM | POA: Diagnosis not present

## 2019-04-18 DIAGNOSIS — G894 Chronic pain syndrome: Secondary | ICD-10-CM | POA: Diagnosis not present

## 2019-04-18 DIAGNOSIS — Z6822 Body mass index (BMI) 22.0-22.9, adult: Secondary | ICD-10-CM | POA: Diagnosis not present

## 2019-05-06 DIAGNOSIS — L03012 Cellulitis of left finger: Secondary | ICD-10-CM | POA: Diagnosis not present

## 2019-05-06 DIAGNOSIS — Z6823 Body mass index (BMI) 23.0-23.9, adult: Secondary | ICD-10-CM | POA: Diagnosis not present

## 2019-05-06 DIAGNOSIS — S61230A Puncture wound without foreign body of right index finger without damage to nail, initial encounter: Secondary | ICD-10-CM | POA: Diagnosis not present

## 2019-05-19 ENCOUNTER — Other Ambulatory Visit: Payer: Self-pay

## 2019-05-19 ENCOUNTER — Emergency Department (HOSPITAL_COMMUNITY)
Admission: EM | Admit: 2019-05-19 | Discharge: 2019-05-20 | Disposition: A | Discharge status: Home / Self Care | Payer: Medicare Other | Attending: Emergency Medicine | Admitting: Emergency Medicine

## 2019-05-19 ENCOUNTER — Encounter (HOSPITAL_COMMUNITY): Payer: Self-pay | Admitting: Emergency Medicine

## 2019-05-19 DIAGNOSIS — Y999 Unspecified external cause status: Secondary | ICD-10-CM | POA: Diagnosis not present

## 2019-05-19 DIAGNOSIS — Y92008 Other place in unspecified non-institutional (private) residence as the place of occurrence of the external cause: Secondary | ICD-10-CM | POA: Insufficient documentation

## 2019-05-19 DIAGNOSIS — I10 Essential (primary) hypertension: Secondary | ICD-10-CM | POA: Insufficient documentation

## 2019-05-19 DIAGNOSIS — S01312A Laceration without foreign body of left ear, initial encounter: Secondary | ICD-10-CM | POA: Diagnosis not present

## 2019-05-19 DIAGNOSIS — W01119A Fall on same level from slipping, tripping and stumbling with subsequent striking against unspecified sharp object, initial encounter: Secondary | ICD-10-CM | POA: Diagnosis not present

## 2019-05-19 DIAGNOSIS — Z79899 Other long term (current) drug therapy: Secondary | ICD-10-CM | POA: Insufficient documentation

## 2019-05-19 DIAGNOSIS — Y939 Activity, unspecified: Secondary | ICD-10-CM | POA: Insufficient documentation

## 2019-05-19 DIAGNOSIS — F17210 Nicotine dependence, cigarettes, uncomplicated: Secondary | ICD-10-CM | POA: Insufficient documentation

## 2019-05-19 MED ORDER — LIDOCAINE-EPINEPHRINE-TETRACAINE (LET) TOPICAL GEL
3.0000 mL | Freq: Once | TOPICAL | Status: AC
  Administered 2019-05-19: 3 mL via TOPICAL
  Filled 2019-05-19: qty 3

## 2019-05-19 NOTE — ED Triage Notes (Signed)
Pt got tripped up while working in his shop and fell. Caused a laceration to his left ear lobe. Bleeding controlled at this time.

## 2019-05-19 NOTE — Discharge Instructions (Addendum)
Have your sutures removed in 7 days.  Keep your wound clean and dry,  Until a good scab forms - you may then wash gently twice daily with mild soap and water, but dry completely after.  Get rechecked for any sign of infection (redness,  Swelling,  Increased pain or drainage of purulent fluid).  It would be ok to apply antibiotic ointment twice daily if you desire.

## 2019-05-20 NOTE — ED Provider Notes (Signed)
[facility] EMERGENCY DEPARTMENT Provider Note   CSN: [id number] Arrival date & time: 05/19/19  2113     History Chief Complaint  Patient presents with  . Laceration    Terry Morrow is a 68 y.o. male presenting with a laceration to his left earlobe.  He tripped in his garage and fell forward hitting his ear against a sharp metal edge.  He denies any other injury including hitting his head (just the ear).  The wound bled significantly but has now improved after application of pressure.  He has had no other tx prior to arrival.  His tetanus is current.   HPI     Past Medical History:  Diagnosis Date  . GERD (gastroesophageal reflux disease)   . Headache(784.0)    MIGRAINE  --  LAST ONE ACOUPLE DAYS AGO  . History of kidney stones yrs ago  . Hypertension   . Insomnia   . Mild emphysema ([place])   . Neuromuscular disorder (HCC)    neuropathy improved since off depakote  . PONV (postoperative nausea and vomiting)   . Prostate disorder    enlarged  . Shortness of breath    with over exertion    Patient Active Problem List   Diagnosis Date Noted  . Encounter for screening colonoscopy 06/23/2017  . H/O failed conscious sedation 06/23/2017  . GERD 01/05/2010  . OTHER DYSPHAGIA 01/05/2010    Past Surgical History:  Procedure Laterality Date  . BONE ANCHORED HEARING AID IMPLANT Left 11/15/2012   Procedure: LEFT BONE ANCHORED HEARING AID (BAHA) IMPLANT;  Surgeon: Izora Gala, MD;  Location: [place];  Service: ENT;  Laterality: Left;  . COLONOSCOPY WITH PROPOFOL N/A 07/20/2017   Procedure: COLONOSCOPY WITH PROPOFOL;  Surgeon: Daneil Dolin, MD;  [facility];  Service: Endoscopy;  Laterality: N/A;  1:00pm  . CYSTECTOMY     larynx  . CYSTOSCOPY WITH INSERTION OF UROLIFT N/A 01/15/2019   Procedure: CYSTOSCOPY WITH INSERTION OF UROLIFT;  Surgeon: Franchot Gallo, MD;  [facility];  Service: Urology;  Laterality: N/A;  . ELBOW SURGERY Bilateral    x 2 on both   . ESOPHAGOGASTRODUODENOSCOPY  2011   Dr. Gala Romney: Empiric dilation for history of dysphagia, hiatal hernia  . EYE SURGERY     BIL  CATARACTS  . INNER EAR SURGERY     x 2 on left  . KNEE SURGERY Bilateral    x 2 both knees arthroscopic  . MASTOIDECTOMY     left  . NECK SURGERY     x 5 stiff at times limited neck rom  . POLYPECTOMY  07/20/2017   Procedure: POLYPECTOMY;  Surgeon: Daneil Dolin, MD;  [facility];  Service: Endoscopy;;  Ileo-cecal valve (HS) x 1; Hepatic flexure(HS) x1; Descending colon(HS) x2  . ROTATOR CUFF REPAIR Right   . WRIST SURGERY     left       No family history on file.  Social History   Tobacco Use  . Smoking status: Current Every Day Smoker    Packs/day: 1.00    Years: 38.00    Pack years: 38.00    Types: Cigarettes  . Smokeless tobacco: Never Used  Substance Use Topics  . Alcohol use: No  . Drug use: No    Home Medications Prior to Admission medications   Medication Sig Start Date End Date Taking? Authorizing Provider  albuterol (PROVENTIL HFA) 108 (90 BASE) MCG/ACT inhaler Inhale 2 puffs into the lungs  every 6 (six) hours as needed for wheezing or shortness of breath.     [provider]  albuterol (PROVENTIL) (2.5 MG/3ML) 0.083% nebulizer solution Take 2.5 mg by nebulization daily as needed for wheezing or shortness of breath. Shortness of breathe    [provider]  amitriptyline (ELAVIL) 100 MG tablet Take 100 mg by mouth at bedtime.     [provider]  HYDROcodone-acetaminophen (NORCO) 10-325 MG tablet Take 1 tablet by mouth every 4 (four) hours as needed for severe pain.    [provider]  metoprolol (LOPRESSOR) 100 MG tablet Take 100 mg by mouth 2 (two) times daily.    [provider]  Multiple Vitamins-Minerals (ONE-A-DAY MENS 50+ PO) Take 1 tablet by mouth daily.    [provider]  nitroGLYCERIN (NITROSTAT) 0.4 MG SL tablet Place 0.4 mg under the tongue every 5 (five)  minutes as needed for chest pain.     [provider]  pravastatin (PRAVACHOL) 40 MG tablet Take 40 mg by mouth at bedtime.    [provider]  SUMAtriptan (IMITREX) 100 MG tablet Take 50 mg by mouth every 2 (two) hours as needed for migraine or headache.     [provider]  Tamsulosin HCl (FLOMAX) 0.4 MG CAPS Take 0.8 mg by mouth at bedtime.     [provider]  zolpidem (AMBIEN) 10 MG tablet Take 10 mg by mouth at bedtime as needed for sleep.     [provider]    Allergies    Patient has no known allergies.  Review of Systems   Review of Systems  Constitutional: Negative for chills and fever.  HENT: Positive for ear pain.   Eyes: Negative for visual disturbance.  Respiratory: Negative for shortness of breath and wheezing.   Gastrointestinal: Negative for nausea.  Musculoskeletal: Negative.   Skin: Positive for wound.  Neurological: Negative for weakness, numbness and headaches.    Physical Exam Updated Vital Signs BP (!) 147/101 (BP Location: Right Arm)   Pulse (!) 104   Temp 98 F (36.7 C) (Oral)   Resp 17   Ht 5\' 11"  (1.803 m)   Wt 76.2 kg   SpO2 98%   BMI 23.43 kg/m   Physical Exam Constitutional:      Appearance: He is well-developed.  HENT:     Head: Normocephalic.     Left Ear: Tympanic membrane and ear canal normal. Laceration present.     Ears:      Comments: Linear through and through laceration left ear lobe.  Hemostatic.  Small superficial abrasion posterior over the mastoid.   Cardiovascular:     Rate and Rhythm: Normal rate.  Pulmonary:     Effort: Pulmonary effort is normal.  Musculoskeletal:        General: Tenderness present.  Skin:    Findings: Laceration present.  Neurological:     Mental Status: He is alert and oriented to person, place, and time.     Sensory: No sensory deficit.     ED Results / Procedures / Treatments   Labs (all labs ordered are listed, but only abnormal results are  displayed) Labs Reviewed - No data to display  EKG None  Radiology No results found.  Procedures Procedures (including critical care time)  LACERATION REPAIR Performed by: Evalee Jefferson Authorized by: Evalee Jefferson Consent: Verbal consent obtained. Risks and benefits: risks, benefits and alternatives were discussed Consent given by: patient Patient identity confirmed: provided demographic data Prepped  and Draped in normal sterile fashion Wound explored  Laceration Location: left ear lobe, complete linear split of the lobe to the antitragus   Laceration Length: 2 cm  No Foreign Bodies seen or palpated  Anesthesia: topical LET  Local anesthetic: LET Anesthetic total: 3 ml  Irrigation method: syringe Amount of cleaning: standard  Skin closure: ethilon 5-0  Number of sutures: 9  Technique: simple interrupted  Patient tolerance: Patient tolerated the procedure well with no immediate complications.   Medications Ordered in ED Medications  lidocaine-EPINEPHrine-tetracaine (LET) topical gel (3 mLs Topical Given 05/19/19 2228)    ED Course  I have reviewed the triage vital signs and the nursing notes.  Pertinent labs & imaging results that were available during my care of the patient were reviewed by me and considered in my medical decision making (see chart for details).    MDM Rules/Calculators/A&P                      Wound care instructions given.  Pt advised to have sutures removed in 10 days,  Return here sooner for any signs of infection including redness, swelling, worse pain or drainage of pus.    Final Clinical Impression(s) / ED Diagnoses Final diagnoses:  Laceration of left earlobe, initial encounter    Rx / DC Orders ED Discharge Orders    None       Landis Martins 05/21/19 0133    Long, W[facility], MD 05/22/19 0700

## 2019-05-27 DIAGNOSIS — Z6823 Body mass index (BMI) 23.0-23.9, adult: Secondary | ICD-10-CM | POA: Diagnosis not present

## 2019-05-27 DIAGNOSIS — T148XXA Other injury of unspecified body region, initial encounter: Secondary | ICD-10-CM | POA: Diagnosis not present

## 2019-07-30 DIAGNOSIS — G44009 Cluster headache syndrome, unspecified, not intractable: Secondary | ICD-10-CM | POA: Diagnosis not present

## 2019-07-30 DIAGNOSIS — G44011 Episodic cluster headache, intractable: Secondary | ICD-10-CM | POA: Diagnosis not present

## 2019-07-30 DIAGNOSIS — G43809 Other migraine, not intractable, without status migrainosus: Secondary | ICD-10-CM | POA: Diagnosis not present

## 2019-08-13 DIAGNOSIS — G43809 Other migraine, not intractable, without status migrainosus: Secondary | ICD-10-CM | POA: Diagnosis not present

## 2019-08-13 DIAGNOSIS — G44011 Episodic cluster headache, intractable: Secondary | ICD-10-CM | POA: Diagnosis not present

## 2019-09-09 DIAGNOSIS — H04121 Dry eye syndrome of right lacrimal gland: Secondary | ICD-10-CM | POA: Diagnosis not present

## 2019-10-08 DIAGNOSIS — Z961 Presence of intraocular lens: Secondary | ICD-10-CM | POA: Diagnosis not present

## 2019-10-08 DIAGNOSIS — H26491 Other secondary cataract, right eye: Secondary | ICD-10-CM | POA: Diagnosis not present

## 2019-10-08 DIAGNOSIS — H04123 Dry eye syndrome of bilateral lacrimal glands: Secondary | ICD-10-CM | POA: Diagnosis not present

## 2019-10-08 DIAGNOSIS — H43811 Vitreous degeneration, right eye: Secondary | ICD-10-CM | POA: Diagnosis not present

## 2019-11-13 DIAGNOSIS — J22 Unspecified acute lower respiratory infection: Secondary | ICD-10-CM | POA: Diagnosis not present

## 2019-11-13 DIAGNOSIS — G894 Chronic pain syndrome: Secondary | ICD-10-CM | POA: Diagnosis not present

## 2019-11-13 DIAGNOSIS — F5101 Primary insomnia: Secondary | ICD-10-CM | POA: Diagnosis not present

## 2019-11-26 DIAGNOSIS — J029 Acute pharyngitis, unspecified: Secondary | ICD-10-CM | POA: Diagnosis not present

## 2019-11-26 DIAGNOSIS — Z6822 Body mass index (BMI) 22.0-22.9, adult: Secondary | ICD-10-CM | POA: Diagnosis not present

## 2019-11-26 DIAGNOSIS — J301 Allergic rhinitis due to pollen: Secondary | ICD-10-CM | POA: Diagnosis not present

## 2019-12-23 DIAGNOSIS — Z961 Presence of intraocular lens: Secondary | ICD-10-CM | POA: Diagnosis not present

## 2019-12-23 DIAGNOSIS — H26491 Other secondary cataract, right eye: Secondary | ICD-10-CM | POA: Diagnosis not present

## 2020-03-09 DIAGNOSIS — M542 Cervicalgia: Secondary | ICD-10-CM | POA: Diagnosis not present

## 2020-04-15 DIAGNOSIS — M542 Cervicalgia: Secondary | ICD-10-CM | POA: Diagnosis not present

## 2020-04-20 DIAGNOSIS — M542 Cervicalgia: Secondary | ICD-10-CM | POA: Diagnosis not present

## 2020-04-22 DIAGNOSIS — Z6822 Body mass index (BMI) 22.0-22.9, adult: Secondary | ICD-10-CM | POA: Diagnosis not present

## 2020-04-22 DIAGNOSIS — F5101 Primary insomnia: Secondary | ICD-10-CM | POA: Diagnosis not present

## 2020-04-22 DIAGNOSIS — Z0001 Encounter for general adult medical examination with abnormal findings: Secondary | ICD-10-CM | POA: Diagnosis not present

## 2020-04-22 DIAGNOSIS — Z0000 Encounter for general adult medical examination without abnormal findings: Secondary | ICD-10-CM | POA: Diagnosis not present

## 2020-04-22 DIAGNOSIS — I499 Cardiac arrhythmia, unspecified: Secondary | ICD-10-CM | POA: Diagnosis not present

## 2020-04-22 DIAGNOSIS — Z1389 Encounter for screening for other disorder: Secondary | ICD-10-CM | POA: Diagnosis not present

## 2020-04-23 DIAGNOSIS — M542 Cervicalgia: Secondary | ICD-10-CM | POA: Diagnosis not present

## 2020-04-27 DIAGNOSIS — M542 Cervicalgia: Secondary | ICD-10-CM | POA: Diagnosis not present

## 2020-04-29 DIAGNOSIS — M542 Cervicalgia: Secondary | ICD-10-CM | POA: Diagnosis not present

## 2020-05-04 DIAGNOSIS — M542 Cervicalgia: Secondary | ICD-10-CM | POA: Diagnosis not present

## 2020-05-06 DIAGNOSIS — M542 Cervicalgia: Secondary | ICD-10-CM | POA: Diagnosis not present

## 2020-05-11 DIAGNOSIS — M542 Cervicalgia: Secondary | ICD-10-CM | POA: Diagnosis not present

## 2020-05-20 DIAGNOSIS — M542 Cervicalgia: Secondary | ICD-10-CM | POA: Diagnosis not present

## 2020-05-22 DIAGNOSIS — M542 Cervicalgia: Secondary | ICD-10-CM | POA: Diagnosis not present

## 2020-05-26 DIAGNOSIS — M542 Cervicalgia: Secondary | ICD-10-CM | POA: Diagnosis not present

## 2020-05-28 DIAGNOSIS — M542 Cervicalgia: Secondary | ICD-10-CM | POA: Diagnosis not present

## 2020-07-09 ENCOUNTER — Encounter: Payer: Self-pay | Admitting: Internal Medicine

## 2020-08-24 ENCOUNTER — Other Ambulatory Visit: Payer: Self-pay

## 2020-08-24 ENCOUNTER — Encounter: Payer: Self-pay | Admitting: Gastroenterology

## 2020-08-24 ENCOUNTER — Telehealth: Payer: Self-pay | Admitting: *Deleted

## 2020-08-24 ENCOUNTER — Ambulatory Visit: Payer: BC Managed Care – PPO | Admitting: Gastroenterology

## 2020-08-24 VITALS — BP 123/69 | HR 90 | Temp 96.9°F | Ht 71.0 in | Wt 168.2 lb

## 2020-08-24 DIAGNOSIS — Z86010 Personal history of colonic polyps: Secondary | ICD-10-CM

## 2020-08-24 DIAGNOSIS — K5900 Constipation, unspecified: Secondary | ICD-10-CM | POA: Diagnosis not present

## 2020-08-24 DIAGNOSIS — Z800 Family history of malignant neoplasm of digestive organs: Secondary | ICD-10-CM | POA: Diagnosis not present

## 2020-08-24 DIAGNOSIS — K219 Gastro-esophageal reflux disease without esophagitis: Secondary | ICD-10-CM

## 2020-08-24 DIAGNOSIS — Z860101 Personal history of adenomatous and serrated colon polyps: Secondary | ICD-10-CM | POA: Insufficient documentation

## 2020-08-24 MED ORDER — PANTOPRAZOLE SODIUM 40 MG PO TBEC: 40.0000 mg | DELAYED_RELEASE_TABLET | Freq: Every day | ORAL | 3 refills | Status: DC

## 2020-08-24 MED ORDER — LUBIPROSTONE 24 MCG PO CAPS: ORAL_CAPSULE | ORAL | 3 refills | Status: DC

## 2020-08-24 MED ORDER — PEG 3350-KCL-NA BICARB-NACL 420 G PO SOLR: ORAL | 0 refills | Status: DC

## 2020-08-24 NOTE — Patient Instructions (Addendum)
1. Colonoscopy as scheduled.  Please see separate instructions. 2. Start pantoprazole 40 mg daily before breakfast for acid reflux. 3. Start Amitiza 24 mcg once or twice daily with food for constipation.  If your stools are too frequent, please let me know so we can adjust dosage.  You can also skip a day if needed. 4. We did not have any samples of Amitiza today.  If you cannot get the medication at the pharmacy, please let us know.  We will need to adequately treat your constipation prior to colonoscopy to make sure you are prep is adequate.

## 2020-08-24 NOTE — Progress Notes (Signed)
Primary Care Physician:  Sharilyn Sites, MD  Primary Gastroenterologist:  Garfield Cornea, MD   Chief Complaint  Patient presents with  . Colonoscopy    Due for 3 year tcs  . Constipation    HPI:  Terry Morrow is a 70 y.o. male here to schedule 3 year surveillance colonoscopy.  Patient's last colonoscopy was February 2019.  He had 6 polyps removed, multiple tubular adenomas.  Advised 3-year surveillance exam.  Sister diagnosed with colon cancer prior to age 75, subsequently had recurrence of disease passed away a few years ago.  Maternal grandfather also had colon cancer.  Today patient states he has had change in bowel habits over the last 8 to 9 months. Used to have BM every day after coffee. Now may have two stools per week. [place] 1. Takes a long time to have BM. Has to strain a lot.  Felt like he had stool impaction couple of times and almost went to the ER but eventually was able to get relief.  Daily bowel regimen includes probiotic and Citrucel.  He has used occasional over-the-counter laxatives but does not recall the name.  Only medication change include switching from metoprolol to verapamil.  Chronically on hydrocodone but can go months at a time without using.  Never uses on a regular basis.  Denies melena or rectal bleeding.  No abdominal pain.  He has had a lot of heartburn.  Takes Pepcid before each meal, helps some but he still has frequent breakthrough symptoms.  Really has to watch what he eats.  Some regurgitation.  No dysphagia.  Colonoscopy February 2019 -Diverticulosis in the sigmoid colon and in the descending colon. - Three 7 to 9 mm polyps in the descending colon, at the hepatic flexure and in the cecum, removed with a hot snare. Resected and retrieved. - Three 3 to 5 mm polyps in the rectum and in the sigmoid colon, removed with a cold snare. Resected and retrieved. - The examination was otherwise normal on direct and retroflexion views. -Multiple tubular  adenomas removed, surveillance exam recommended in 3 years.  EGD 2011 -Empiric dilation for history of dysphagia -Hiatal hernia   Current Outpatient Medications  Medication Sig Dispense Refill  . albuterol (PROVENTIL) (2.5 MG/3ML) 0.083% nebulizer solution Take 2.5 mg by nebulization daily as needed for wheezing or shortness of breath. Shortness of breathe    . albuterol (VENTOLIN HFA) 108 (90 Base) MCG/ACT inhaler Inhale 2 puffs into the lungs every 6 (six) hours as needed for wheezing or shortness of breath.    Marland Kitchen amitriptyline (ELAVIL) 100 MG tablet Take 100 mg by mouth at bedtime.    Marland Kitchen HYDROcodone-acetaminophen (NORCO) 10-325 MG tablet Take 1 tablet by mouth every 4 (four) hours as needed for severe pain.    . Multiple Vitamins-Minerals (ONE-A-DAY MENS 50+ PO) Take 1 tablet by mouth daily.    . nitroGLYCERIN (NITROSTAT) 0.4 MG SL tablet Place 0.4 mg under the tongue every 5 (five) minutes as needed for chest pain.     . pravastatin (PRAVACHOL) 40 MG tablet Take 40 mg by mouth at bedtime.    . SUMAtriptan (IMITREX) 100 MG tablet Take 50 mg by mouth every 2 (two) hours as needed for migraine or headache.     . verapamil (CALAN-SR) 180 MG CR tablet Take 180 mg by mouth at bedtime.    Marland Kitchen zolpidem (AMBIEN) 10 MG tablet Take 10 mg by mouth at bedtime as needed for sleep.  No current facility-administered medications for this visit.    Allergies as of 08/24/2020  . (No Known Allergies)    Past Medical History:  Diagnosis Date  . GERD (gastroesophageal reflux disease)   . Headache(784.0)    MIGRAINE  --  LAST ONE ACOUPLE DAYS AGO  . History of kidney stones yrs ago  . Hypertension   . Insomnia   . Mild emphysema ([place])   . Neuromuscular disorder ([facility])    neuropathy improved since off depakote  . PONV (postoperative nausea and vomiting)   . Prostate disorder    enlarged  . Shortness of breath    with over exertion    Past Surgical History:  Procedure Laterality Date  . BONE  ANCHORED HEARING AID IMPLANT Left 11/15/2012   Procedure: LEFT BONE ANCHORED HEARING AID (BAHA) IMPLANT;  Surgeon: Izora Gala, MD;  Location: [place];  Service: ENT;  Laterality: Left;  . COLONOSCOPY WITH PROPOFOL N/A 07/20/2017   Dr. Gala Romney: Diverticulosis, 6 polyps removed, multiple tubular adenomas.  Surveillance exam advised in 3 years.  . CYSTECTOMY     larynx  . CYSTOSCOPY WITH INSERTION OF UROLIFT N/A 01/15/2019   Procedure: CYSTOSCOPY WITH INSERTION OF UROLIFT;  Surgeon: Franchot Gallo, MD;  Location: [facility];  Service: Urology;  Laterality: N/A;  . ELBOW SURGERY Bilateral    x 2 on both  . ESOPHAGOGASTRODUODENOSCOPY  2011   Dr. Gala Romney: Empiric dilation for history of dysphagia, hiatal hernia  . EYE SURGERY     BIL  CATARACTS  . INNER EAR SURGERY     x 2 on left  . KNEE SURGERY Bilateral    x 2 both knees arthroscopic  . MASTOIDECTOMY     left  . NECK SURGERY     x 5 stiff at times limited neck rom  . POLYPECTOMY  07/20/2017   Procedure: POLYPECTOMY;  Surgeon: Daneil Dolin, MD;  Location: [facility];  Service: Endoscopy;;  Ileo-cecal valve (HS) x 1; Hepatic flexure(HS) x1; Descending colon(HS) x2  . ROTATOR CUFF REPAIR Right   . WRIST SURGERY     left    Family History  Problem Relation Age of Onset  . Colon cancer Sister        diagnosed at age <61 several years later metastasizd  . Colon cancer Maternal Grandfather     Social History   Socioeconomic History  . Marital status: Married    Spouse name: Not on file  . Number of children: Not on file  . Years of education: Not on file  . Highest education level: Not on file  Occupational History  . Not on file  Tobacco Use  . Smoking status: Current Every Day Smoker    Packs/day: 1.00    Years: 38.00    Pack years: 38.00    Types: Cigarettes  . Smokeless tobacco: Never Used  Vaping Use  . Vaping Use: Never used  Substance and Sexual Activity  . Alcohol use: No  . Drug use: No  . Sexual activity: Yes     Birth control/protection: None  Other Topics Concern  . Not on file  Social History Narrative  . Not on file   Social Determinants of Health   Financial Resource Strain: Not on file  Food Insecurity: Not on file  Transportation Needs: Not on file  Physical Activity: Not on file  Stress: Not on file  Social Connections: Not on file  Intimate Partner Violence: Not on file  ROS:  General: Negative for anorexia, weight loss, fever, chills, fatigue, weakness. Eyes: Negative for vision changes.  ENT: Negative for hoarseness, difficulty swallowing , nasal congestion. CV: Negative for chest pain, angina, palpitations, some dyspnea on exertion with moderate exertion, peripheral edema.  Respiratory: Negative for dyspnea at rest, some dyspnea on exertion moderate exertion, cough, sputum, wheezing.  GI: See history of present illness. GU:  Negative for dysuria, hematuria, urinary incontinence, urinary frequency, nocturnal urination.  MS: Negative for joint pain, low back pain.  Derm: Negative for rash or itching.  Neuro: Negative for weakness, abnormal sensation, seizure, frequent headaches, memory loss, confusion.  Psych: Negative for anxiety, depression, suicidal ideation, hallucinations.  Endo: Negative for unusual weight change.  Heme: Negative for bruising or bleeding. Allergy: Negative for rash or hives.    Physical Examination:  BP 123/69   Pulse 90   Temp (!) 96.9 F (36.1 C) (Temporal)   Ht 5\' 11"  (1.803 m)   Wt 168 lb 3.2 oz (76.3 kg)   BMI 23.46 kg/m    General: Well-nourished, well-developed in no acute distress.  Head: Normocephalic, atraumatic.   Eyes: Conjunctiva pink, no icterus. Mouth: masked Neck: Supple without thyromegaly, masses, or lymphadenopathy.  Lungs: Clear to auscultation bilaterally.  Heart: Regular rate and rhythm, no murmurs rubs or gallops.  Abdomen: Bowel sounds are normal, nontender, nondistended, no hepatosplenomegaly or masses, no  abdominal bruits or    hernia , no rebound or guarding.   Rectal: not performed Extremities: No lower extremity edema. No clubbing or deformities.  Neuro: Alert and oriented x 4 , grossly normal neurologically.  Skin: Warm and dry, no rash or jaundice.   Psych: Alert and cooperative, normal mood and affect.  Labs: requested  Imaging Studies: No results found.  Assessment/plan:  Pleasant 70 year old male presenting to schedule 3 year surveillance colonoscopy.  Presents with complaints of change in bowel habits/constipation, refractory GERD.  History of adenomatous colon polyps: Due for 3-year exam at this time.  Patient has a family history of colon cancer, sister at an age less than 82, maternal grandfather as well.  Plan for colonoscopy in the near future.  Previously failed conscious sedation therefore plan for deep sedation. ASA III.  I have discussed the risks, alternatives, benefits with regards to but not limited to the risk of reaction to medication, bleeding, infection, perforation and the patient is agreeable to proceed. Written consent to be obtained.  Patient bowel habits/constipation: New over the past 8 to 9 months.  Single medication change as outlined above.  Requested labs from PCP, check TSH and calcium if not done.  Colonoscopy as planned.  Amitiza 24 mcg twice daily with food.  Call if persistent constipation, especially 1 week prior to colonoscopy so that we can make regimen changes as needed. Call if any issues obtaining medication.   GERD: Refractory symptoms on H2 blocker.  Will switch to pantoprazole 40 mg daily before breakfast.  Reinforced antireflux measures. Call with persistent symptoms.

## 2020-08-24 NOTE — Telephone Encounter (Signed)
Called pt. He has been scheduled for TCS with propofol, Dr. Gala Romney, ASA 3 on 5/9. Aware will mail prep instructions with pre-op/covid test appt. Confirmed pharmacy. Confirmed mailing address.

## 2020-08-25 ENCOUNTER — Encounter: Payer: Self-pay | Admitting: *Deleted

## 2020-08-25 NOTE — Progress Notes (Signed)
CC'ED TO PCP 

## 2020-10-02 HISTORY — PX: OTHER SURGICAL HISTORY: SHX169

## 2020-10-07 ENCOUNTER — Encounter (HOSPITAL_COMMUNITY): Payer: Self-pay

## 2020-10-07 NOTE — Patient Instructions (Signed)
PAXTON BINNS  10/07/2020     @PREFPERIOPPHARMACY @   Your procedure is scheduled on  10/12/2020   Report to [facility] at  34  A.M.   Call this number if you have problems the morning of surgery:  [phone]   Remember:   Follow the diet and prep instructions given to you by the office.                      Take these medicines the morning of surgery with A SIP OF WATER  Hydrocodone (if needed), protonix, imitrex (if needed).  Use your nebulizer and your inhalers before you come and bring your rescue inhaler with you.     Please brush your teeth.  Do not wear jewelry, make-up or nail polish.  Do not wear lotions, powders, or perfumes, or deodorant.  Do not shave 48 hours prior to surgery.  Men may shave face and neck.  Do not bring valuables to the hospital.  [facility] is not responsible for any belongings or valuables.  Contacts, dentures or bridgework may not be worn into surgery.  Leave your suitcase in the car.  After surgery it may be brought to your room.  For patients admitted to the hospital, discharge time will be determined by your treatment team.  Patients discharged the day of surgery will not be allowed to drive home and must have someone with them for 24 hours.   Special instructions:  DO NOT smoke tobacco or vape for 24 hours after your procedure.  Please read over the following fact sheets that you were given. Anesthesia Post-op Instructions and Care and Recovery After Surgery       Colonoscopy, Adult, Care After This sheet gives you information about how to care for yourself after your procedure. Your health care provider may also give you more specific instructions. If you have problems or questions, contact your health care provider. What can I expect after the procedure? After the procedure, it is common to have:  A small amount of blood in your stool for 24 hours after the procedure.  Some gas.  Mild cramping or bloating of  your abdomen. Follow these instructions at home: Eating and drinking  Drink enough fluid to keep your urine pale yellow.  Follow instructions from your health care provider about eating or drinking restrictions.  Resume your normal diet as instructed by your health care provider. Avoid heavy or fried foods that are hard to digest.   Activity  Rest as told by your health care provider.  Avoid sitting for a long time without moving. Get up to take short walks every 1-2 hours. This is important to improve blood flow and breathing. Ask for help if you feel weak or unsteady.  Return to your normal activities as told by your health care provider. Ask your health care provider what activities are safe for you. Managing cramping and bloating  Try walking around when you have cramps or feel bloated.  Apply heat to your abdomen as told by your health care provider. Use the heat source that your health care provider recommends, such as a moist heat pack or a heating pad. ? Place a towel between your skin and the heat source. ? Leave the heat on for 20-30 minutes. ? Remove the heat if your skin turns bright red. This is especially important if you are unable to feel pain, heat, or cold. You may have  a greater risk of getting burned.   General instructions  If you were given a sedative during the procedure, it can affect you for several hours. Do not drive or operate machinery until your health care provider says that it is safe.  For the first 24 hours after the procedure: ? Do not sign important documents. ? Do not drink alcohol. ? Do your regular daily activities at a slower pace than normal. ? Eat soft foods that are easy to digest.  Take over-the-counter and prescription medicines only as told by your health care provider.  Keep all follow-up visits as told by your health care provider. This is important. Contact a health care provider if:  You have blood in your stool 2-3 days after  the procedure. Get help right away if you have:  More than a small spotting of blood in your stool.  Large blood clots in your stool.  Swelling of your abdomen.  Nausea or vomiting.  A fever.  Increasing pain in your abdomen that is not relieved with medicine. Summary  After the procedure, it is common to have a small amount of blood in your stool. You may also have mild cramping and bloating of your abdomen.  If you were given a sedative during the procedure, it can affect you for several hours. Do not drive or operate machinery until your health care provider says that it is safe.  Get help right away if you have a lot of blood in your stool, nausea or vomiting, a fever, or increased pain in your abdomen. This information is not intended to replace advice given to you by your health care provider. Make sure you discuss any questions you have with your health care provider. Document Revised: 05/17/2019 Document Reviewed: 12/17/2018 Elsevier Patient Education  2021 [place] After This sheet gives you information about how to care for yourself after your procedure. Your health care provider may also give you more specific instructions. If you have problems or questions, contact your health care provider. What can I expect after the procedure? After the procedure, it is common to have:  Tiredness.  Forgetfulness about what happened after the procedure.  Impaired judgment for important decisions.  Nausea or vomiting.  Some difficulty with balance. Follow these instructions at home: For the time period you were told by your health care provider:  Rest as needed.  Do not participate in activities where you could fall or become injured.  Do not drive or use machinery.  Do not drink alcohol.  Do not take sleeping pills or medicines that cause drowsiness.  Do not make important decisions or sign legal documents.  Do not take care of  children on your own.      Eating and drinking  Follow the diet that is recommended by your health care provider.  Drink enough fluid to keep your urine pale yellow.  If you vomit: ? Drink water, juice, or soup when you can drink without vomiting. ? Make sure you have little or no nausea before eating solid foods. General instructions  Have a responsible adult stay with you for the time you are told. It is important to have someone help care for you until you are awake and alert.  Take over-the-counter and prescription medicines only as told by your health care provider.  If you have sleep apnea, surgery and certain medicines can increase your risk for breathing problems. Follow instructions from your health care provider about  wearing your sleep device: ? Anytime you are sleeping, including during daytime naps. ? While taking prescription pain medicines, sleeping medicines, or medicines that make you drowsy.  Avoid smoking.  Keep all follow-up visits as told by your health care provider. This is important. Contact a health care provider if:  You keep feeling nauseous or you keep vomiting.  You feel light-headed.  You are still sleepy or having trouble with balance after 24 hours.  You develop a rash.  You have a fever.  You have redness or swelling around the IV site. Get help right away if:  You have trouble breathing.  You have new-onset confusion at home. Summary  For several hours after your procedure, you may feel tired. You may also be forgetful and have poor judgment.  Have a responsible adult stay with you for the time you are told. It is important to have someone help care for you until you are awake and alert.  Rest as told. Do not drive or operate machinery. Do not drink alcohol or take sleeping pills.  Get help right away if you have trouble breathing, or if you suddenly become confused. This information is not intended to replace advice given to you  by your health care provider. Make sure you discuss any questions you have with your health care provider. Document Revised: 02/06/2020 Document Reviewed: 04/25/2019 Elsevier Patient Education  2021 [employer].

## 2020-10-08 ENCOUNTER — Other Ambulatory Visit (HOSPITAL_COMMUNITY)
Admission: RE | Admit: 2020-10-08 | Discharge: 2020-10-08 | Disposition: A | Discharge status: Home / Self Care | Payer: Medicare Other | Source: Ambulatory Visit | Attending: Internal Medicine | Admitting: Internal Medicine

## 2020-10-08 ENCOUNTER — Encounter (HOSPITAL_COMMUNITY)
Admission: RE | Admit: 2020-10-08 | Discharge: 2020-10-08 | Disposition: A | Discharge status: Home / Self Care | Payer: Medicare Other | Source: Ambulatory Visit | Attending: Internal Medicine | Admitting: Internal Medicine

## 2020-10-08 ENCOUNTER — Other Ambulatory Visit: Payer: Self-pay

## 2020-10-08 ENCOUNTER — Encounter (HOSPITAL_COMMUNITY): Payer: Self-pay

## 2020-10-08 DIAGNOSIS — Z01812 Encounter for preprocedural laboratory examination: Secondary | ICD-10-CM | POA: Insufficient documentation

## 2020-10-08 DIAGNOSIS — Z20822 Contact with and (suspected) exposure to covid-19: Secondary | ICD-10-CM | POA: Diagnosis not present

## 2020-10-08 NOTE — Progress Notes (Addendum)
I had to release the order because it had not been released. I checked release it away because he has to have a Covid test before he can have his procedure.

## 2020-10-09 LAB — SARS CORONAVIRUS 2 (TAT 6-24 HRS): SARS Coronavirus 2: NEGATIVE

## 2020-10-12 ENCOUNTER — Ambulatory Visit (HOSPITAL_COMMUNITY)
Admission: RE | Admit: 2020-10-12 | Discharge: 2020-10-12 | Disposition: A | Discharge status: Home / Self Care | Payer: Medicare Other | Attending: Internal Medicine | Admitting: Internal Medicine

## 2020-10-12 ENCOUNTER — Ambulatory Visit (HOSPITAL_COMMUNITY): Payer: Medicare Other | Admitting: Anesthesiology

## 2020-10-12 ENCOUNTER — Encounter (HOSPITAL_COMMUNITY): Payer: Self-pay | Admitting: Internal Medicine

## 2020-10-12 ENCOUNTER — Other Ambulatory Visit: Payer: Self-pay

## 2020-10-12 ENCOUNTER — Encounter (HOSPITAL_COMMUNITY)
Admission: RE | Disposition: A | Discharge status: Home / Self Care | Payer: Self-pay | Source: Home / Self Care | Attending: Internal Medicine

## 2020-10-12 DIAGNOSIS — F17210 Nicotine dependence, cigarettes, uncomplicated: Secondary | ICD-10-CM | POA: Diagnosis not present

## 2020-10-12 DIAGNOSIS — D122 Benign neoplasm of ascending colon: Secondary | ICD-10-CM | POA: Diagnosis not present

## 2020-10-12 DIAGNOSIS — D124 Benign neoplasm of descending colon: Secondary | ICD-10-CM | POA: Insufficient documentation

## 2020-10-12 DIAGNOSIS — Z86010 Personal history of colonic polyps: Secondary | ICD-10-CM | POA: Insufficient documentation

## 2020-10-12 DIAGNOSIS — Z79899 Other long term (current) drug therapy: Secondary | ICD-10-CM | POA: Diagnosis not present

## 2020-10-12 DIAGNOSIS — Z800 Family history of malignant neoplasm of digestive organs: Secondary | ICD-10-CM | POA: Diagnosis not present

## 2020-10-12 DIAGNOSIS — K635 Polyp of colon: Secondary | ICD-10-CM | POA: Diagnosis not present

## 2020-10-12 DIAGNOSIS — Z1211 Encounter for screening for malignant neoplasm of colon: Secondary | ICD-10-CM | POA: Insufficient documentation

## 2020-10-12 DIAGNOSIS — J449 Chronic obstructive pulmonary disease, unspecified: Secondary | ICD-10-CM | POA: Diagnosis not present

## 2020-10-12 HISTORY — PX: COLONOSCOPY WITH PROPOFOL: SHX5780

## 2020-10-12 HISTORY — PX: POLYPECTOMY: SHX5525

## 2020-10-12 SURGERY — COLONOSCOPY WITH PROPOFOL
Anesthesia: General

## 2020-10-12 MED ORDER — PROPOFOL 500 MG/50ML IV EMUL
INTRAVENOUS | Status: DC | PRN
  Administered 2020-10-12: 150 ug/kg/min via INTRAVENOUS

## 2020-10-12 MED ORDER — LACTATED RINGERS IV SOLN: INTRAVENOUS | Status: DC

## 2020-10-12 MED ORDER — LIDOCAINE HCL (CARDIAC) PF 100 MG/5ML IV SOSY
PREFILLED_SYRINGE | INTRAVENOUS | Status: DC | PRN
  Administered 2020-10-12: 50 mg via INTRAVENOUS

## 2020-10-12 MED ORDER — STERILE WATER FOR IRRIGATION IR SOLN
Status: DC | PRN
  Administered 2020-10-12: 1.5 mL

## 2020-10-12 MED ORDER — PROPOFOL 10 MG/ML IV BOLUS
INTRAVENOUS | Status: DC | PRN
  Administered 2020-10-12 (×2): 50 mg via INTRAVENOUS
  Administered 2020-10-12: 100 mg via INTRAVENOUS
  Administered 2020-10-12: 50 mg via INTRAVENOUS

## 2020-10-12 NOTE — H&P (Signed)
@LOGO @   Primary Care Physician:  Sharilyn Sites, MD Primary Gastroenterologist:  Dr. Gala Romney  Pre-Procedure History & Physical: HPI:  Terry Morrow is a 70 y.o. male here for surveillance colonoscopy.  History of multiple colonic adenomas removed 3 years ago.  Also, sister with colon cancer at a young age.  No bowel symptoms currently.    Past Medical History:  Diagnosis Date  . GERD (gastroesophageal reflux disease)   . Headache(784.0)    MIGRAINE  --  LAST ONE ACOUPLE DAYS AGO  . History of kidney stones yrs ago  . Hypertension   . Insomnia   . Mild emphysema ([place])   . Neuromuscular disorder (HCC)    neuropathy improved since off depakote  . PONV (postoperative nausea and vomiting)   . Prostate disorder    enlarged  . Shortness of breath    with over exertion    Past Surgical History:  Procedure Laterality Date  . BONE ANCHORED HEARING AID IMPLANT Left 11/15/2012   Procedure: LEFT BONE ANCHORED HEARING AID (BAHA) IMPLANT;  Surgeon: Izora Gala, MD;  Location: [place];  Service: ENT;  Laterality: Left;  . COLONOSCOPY WITH PROPOFOL N/A 07/20/2017   Dr. Gala Romney: Diverticulosis, 6 polyps removed, multiple tubular adenomas.  Surveillance exam advised in 3 years.  . CYSTECTOMY     larynx  . CYSTOSCOPY WITH INSERTION OF UROLIFT N/A 01/15/2019   Procedure: CYSTOSCOPY WITH INSERTION OF UROLIFT;  Surgeon: Franchot Gallo, MD;  Location: [facility];  Service: Urology;  Laterality: N/A;  . dental implant surgery  10/02/2020  . ELBOW SURGERY Bilateral    x 2 on both  . ESOPHAGOGASTRODUODENOSCOPY  2011   Dr. Gala Romney: Empiric dilation for history of dysphagia, hiatal hernia  . EYE SURGERY     BIL  CATARACTS  . INNER EAR SURGERY     x 2 on left  . KNEE SURGERY Bilateral    x 2 both knees arthroscopic  . MASTOIDECTOMY     left  . NECK SURGERY     x 5 stiff at times limited neck rom  . POLYPECTOMY  07/20/2017   Procedure: POLYPECTOMY;  Surgeon: Daneil Dolin, MD;  Location: [facility];  Service: Endoscopy;;  Ileo-cecal valve (HS) x 1; Hepatic flexure(HS) x1; Descending colon(HS) x2  . ROTATOR CUFF REPAIR Right   . WRIST SURGERY     left    Prior to Admission medications   Medication Sig Start Date End Date Taking? Authorizing Provider  albuterol (PROVENTIL) (2.5 MG/3ML) 0.083% nebulizer solution Take 2.5 mg by nebulization daily as needed for wheezing or shortness of breath. Shortness of breathe   Yes [provider]  albuterol (VENTOLIN HFA) 108 (90 Base) MCG/ACT inhaler Inhale 2 puffs into the lungs every 6 (six) hours as needed for wheezing or shortness of breath.   Yes [provider]  amitriptyline (ELAVIL) 100 MG tablet Take 100 mg by mouth at bedtime.   Yes [provider]  lubiprostone (AMITIZA) 24 MCG capsule 1 capsule with food once or twice daily to treat constipation. Patient taking differently: Take 24 mcg by mouth 2 (two) times daily with a meal. 08/24/20  Yes Mahala Menghini, PA-C  Multiple Vitamins-Minerals (ONE-A-DAY MENS 50+ PO) Take 1 tablet by mouth daily.   Yes [provider]  pantoprazole (PROTONIX) 40 MG tablet Take 1 tablet (40 mg total) by mouth daily before breakfast. 08/24/20  Yes Mahala Menghini, PA-C  polyethylene glycol-electrolytes (NULYTELY) 420 g solution  As directed 08/24/20  Yes Carlene Bickley, Cristopher Estimable, MD  pravastatin (PRAVACHOL) 40 MG tablet Take 40 mg by mouth at bedtime.   Yes [provider]  verapamil (CALAN-SR) 180 MG CR tablet Take 180 mg by mouth at bedtime. 07/22/20  Yes [provider]  zolpidem (AMBIEN) 10 MG tablet Take 10 mg by mouth at bedtime.   Yes [provider]  Glycerin-Hypromellose-PEG 400 (DRY EYE RELIEF DROPS) 0.2-0.2-1 % SOLN Place 1 drop into both eyes daily as needed (Dry eyes).    [provider]  HYDROcodone-acetaminophen (NORCO) 10-325 MG tablet Take 1 tablet by mouth every 6 (six) hours as needed for severe pain or moderate pain.    [provider]  nitroGLYCERIN (NITROSTAT) 0.4 MG SL tablet Place 0.4 mg under the tongue every 5 (five) minutes as needed for chest pain.     [provider]  SUMAtriptan (IMITREX) 100 MG tablet Take 50 mg by mouth every 2 (two) hours as needed for migraine or headache.     [provider]    Allergies as of 08/24/2020  . (No Known Allergies)    Family History  Problem Relation Age of Onset  . Colon cancer Sister        diagnosed at age <34 several years later metastasizd  . Colon cancer Maternal Grandfather     Social History   Socioeconomic History  . Marital status: Married    Spouse name: Not on file  . Number of children: Not on file  . Years of education: Not on file  . Highest education level: Not on file  Occupational History  . Not on file  Tobacco Use  . Smoking status: Current Every Day Smoker    Packs/day: 1.00    Years: 38.00    Pack years: 38.00    Types: Cigarettes  . Smokeless tobacco: Never Used  Vaping Use  . Vaping Use: Never used  Substance and Sexual Activity  . Alcohol use: No  . Drug use: No  . Sexual activity: Yes    Birth control/protection: None  Other Topics Concern  . Not on file  Social History Narrative  . Not on file   Social Determinants of Health   Financial Resource Strain: Not on file  Food Insecurity: Not on file  Transportation Needs: Not on file  Physical Activity: Not on file  Stress: Not on file  Social Connections: Not on file  Intimate Partner Violence: Not on file    Review of Systems: See HPI, otherwise negative ROS  Physical Exam: BP (!) 145/76   Temp 97.8 F (36.6 C) (Oral)   Resp 14   Ht 5\' 11"  (1.803 m)   Wt 76.7 kg   SpO2 100%   BMI 23.57 kg/m  General:   Alert,  Well-developed, well-nourished, pleasant and cooperative in NAD Neck:  Supple; no masses or thyromegaly. No significant cervical adenopathy. Lungs:  Clear throughout to auscultation.   No wheezes, crackles, or rhonchi. No  acute distress. Heart:  Regular rate and rhythm; no murmurs, clicks, rubs,  or gallops. Abdomen: Non-distended, normal bowel sounds.  Soft and nontender without appreciable mass or hepatosplenomegaly.  Pulses:  Normal pulses noted. Extremities:  Without clubbing or edema.  Impression/Plan:  45-year gentleman here for a surveillance colonoscopy.  Multiple colonic adenomas removed previously.  Positive family history of colon cancer first-degree relative at a young age. The risks, benefits, limitations, alternatives and imponderables have been reviewed with the patient. Questions have  been answered. All parties are agreeable.      Notice: This dictation was prepared with Dragon dictation along with smaller phrase technology. Any transcriptional errors that result from this process are unintentional and may not be corrected upon review.

## 2020-10-12 NOTE — Anesthesia Postprocedure Evaluation (Signed)
Anesthesia Post Note  Patient: Terry Morrow  Procedure(s) Performed: COLONOSCOPY WITH PROPOFOL (N/A ) POLYPECTOMY  Patient location during evaluation: PACU Anesthesia Type: General Level of consciousness: awake and alert and oriented Pain management: pain level controlled Vital Signs Assessment: post-procedure vital signs reviewed and stable Respiratory status: spontaneous breathing, nonlabored ventilation and respiratory function stable Cardiovascular status: blood pressure returned to baseline and stable Postop Assessment: no apparent nausea or vomiting Anesthetic complications: no   No complications documented.   Last Vitals:  Vitals:   10/12/20 1400 10/12/20 1405  BP: 94/65 125/69  Pulse: 77 80  Resp: 15 18  Temp:  36.6 C  SpO2: 100% 99%    Last Pain:  Vitals:   10/12/20 1405  TempSrc: Oral  PainSc: 0-No pain                 Raghav Verrilli C Avinash Maltos

## 2020-10-12 NOTE — Transfer of Care (Signed)
Immediate Anesthesia Transfer of Care Note  Patient: Terry Morrow  Procedure(s) Performed: COLONOSCOPY WITH PROPOFOL (N/A ) POLYPECTOMY  Patient Location: PACU  Anesthesia Type:General  Level of Consciousness: drowsy  Airway & Oxygen Therapy: Patient Spontanous Breathing and Patient connected to nasal cannula oxygen  Post-op Assessment: Report given to RN and Post -op Vital signs reviewed and stable  Post vital signs: Reviewed and stable  Last Vitals:  Vitals Value Taken Time  BP    Temp    Pulse    Resp    SpO2      Last Pain:  Vitals:   10/12/20 1303  TempSrc:   PainSc: 0-No pain      Patients Stated Pain Goal: 7 (22/63/33 5456)  Complications: No complications documented.

## 2020-10-12 NOTE — Op Note (Signed)
[facility] Patient Name: Terry Morrow Procedure Date: 10/12/2020 12:52 PM MRN: [id number] Date of Birth: 1951-03-25 Attending MD: Norvel Richards , MD CSN: [id number] Age: 70 Admit Type: Outpatient Procedure:                Colonoscopy Indications:              High risk colon cancer surveillance: Personal                            history of colonic polyps Providers:                Norvel Richards, MD, Caprice Kluver, Raphael Gibney, Technician Referring MD:              Medicines:                Propofol per Anesthesia Complications:            No immediate complications. Estimated Blood Loss:     Estimated blood loss was minimal. Procedure:                After obtaining informed consent, the colonoscope                            was passed under direct vision. Throughout the                            procedure, the patient's blood pressure, pulse, and                            oxygen saturations were monitored continuously. The                            CF-HQ190L ([id number]) scope was introduced through                            the anus and advanced to the the cecum, identified                            by appendiceal orifice and ileocecal valve. The                            colonoscopy was performed without difficulty. The                            patient tolerated the procedure well. The quality                            of the bowel preparation was adequate. The                            ileocecal valve, appendiceal orifice, and rectum  were photographed. The entire colon was well                            visualized. Scope In: 1:08:48 PM Scope Out: 1:36:02 PM Scope Withdrawal Time: 0 hours 18 minutes 26 seconds  Total Procedure Duration: 0 hours 27 minutes 14 seconds  Findings:      The perianal and digital rectal examinations were normal.      Nine sessile polyps were found in the descending  colon and ascending       colon. The polyps were 4 to 7 mm in size. These polyps were removed with       a cold snare. Resection and retrieval were complete. Estimated blood       loss was minimal.      The exam was otherwise without abnormality on direct and retroflexion       views. Impression:               - Nine 4 to 7 mm polyps in the descending colon and                            in the ascending colon, removed with a cold snare.                            Resected and retrieved.                           - The examination was otherwise normal on direct                            and retroflexion views. Moderate Sedation:      Moderate (conscious) sedation was personally administered by an       anesthesia professional. The following parameters were monitored: oxygen       saturation, heart rate, blood pressure, respiratory rate, EKG, adequacy       of pulmonary ventilation, and response to care. Recommendation:           - Patient has a contact number available for                            emergencies. The signs and symptoms of potential                            delayed complications were discussed with the                            patient. Return to normal activities tomorrow.                            Written discharge instructions were provided to the                            patient.                           - Resume previous diet.                           -  Continue present medications.                           - Repeat colonoscopy date to be determined after                            pending pathology results are reviewed for                            surveillance based on pathology results.                           - Return to GI office (date not yet determined). Procedure Code(s):        --- Professional ---                           [phone], Colonoscopy, flexible; with removal of                            tumor(s), polyp(s), or other lesion(s) by snare                             technique Diagnosis Code(s):        --- Professional ---                           Z86.010, Personal history of colonic polyps                           K63.5, Polyp of colon CPT copyright 2019 American Medical Association. All rights reserved. The codes documented in this report are preliminary and upon coder review may  be revised to meet current compliance requirements. Cristopher Estimable. Harmon Bommarito, MD Norvel Richards, MD 10/12/2020 1:51:29 PM This report has been signed electronically. Number of Addenda: 0

## 2020-10-12 NOTE — Discharge Instructions (Signed)
Colonoscopy Discharge Instructions  Read the instructions outlined below and refer to this sheet in the next few weeks. These discharge instructions provide you with general information on caring for yourself after you leave the hospital. Your doctor may also give you specific instructions. While your treatment has been planned according to the most current medical practices available, unavoidable complications occasionally occur. If you have any problems or questions after discharge, call Dr. Gala Romney at [phone]. ACTIVITY  You may resume your regular activity, but move at a slower pace for the next 24 hours.   Take frequent rest periods for the next 24 hours.   Walking will help get rid of the air and reduce the bloated feeling in your belly (abdomen).   No driving for 24 hours (because of the medicine (anesthesia) used during the test).    Do not sign any important legal documents or operate any machinery for 24 hours (because of the anesthesia used during the test).  NUTRITION  Drink plenty of fluids.   You may resume your normal diet as instructed by your doctor.   Begin with a light meal and progress to your normal diet. Heavy or fried foods are harder to digest and may make you feel sick to your stomach (nauseated).   Avoid alcoholic beverages for 24 hours or as instructed.  MEDICATIONS  You may resume your normal medications unless your doctor tells you otherwise.  WHAT YOU CAN EXPECT TODAY  Some feelings of bloating in the abdomen.   Passage of more gas than usual.   Spotting of blood in your stool or on the toilet paper.  IF YOU HAD POLYPS REMOVED DURING THE COLONOSCOPY:  No aspirin products for 7 days or as instructed.   No alcohol for 7 days or as instructed.   Eat a soft diet for the next 24 hours.  FINDING OUT THE RESULTS OF YOUR TEST Not all test results are available during your visit. If your test results are not back during the visit, make an appointment  with your caregiver to find out the results. Do not assume everything is normal if you have not heard from your caregiver or the medical facility. It is important for you to follow up on all of your test results.  SEEK IMMEDIATE MEDICAL ATTENTION IF:  You have more than a spotting of blood in your stool.   Your belly is swollen (abdominal distention).   You are nauseated or vomiting.   You have a temperature over 101.   You have abdominal pain or discomfort that is severe or gets worse throughout the day.      Colon Polyps  Colon polyps are tissue growths inside the colon, which is part of the large intestine. They are one of the types of polyps that can grow in the body. A polyp may be a round bump or a mushroom-shaped growth. You could have one polyp or more than one. Most colon polyps are noncancerous (benign). However, some colon polyps can become cancerous over time. Finding and removing the polyps early can help prevent this. What are the causes? The exact cause of colon polyps is not known. What increases the risk? The following factors may make you more likely to develop this condition:  Having a family history of colorectal cancer or colon polyps.  Being older than 71 years of age.  Being younger than 71 years of age and having a significant family history of colorectal cancer or colon polyps or a genetic  condition that puts you at higher risk of getting colon polyps.  Having inflammatory bowel disease, such as ulcerative colitis or Crohn's disease.  Having certain conditions passed from parent to child (hereditary conditions), such as: ? Familial adenomatous polyposis (FAP). ? Lynch syndrome. ? Turcot syndrome. ? Peutz-Jeghers syndrome. ? MUTYH-associated polyposis (MAP).  Being overweight.  Certain lifestyle factors. These include smoking cigarettes, drinking too much alcohol, not getting enough exercise, and eating a diet that is high in fat and red meat and low  in fiber.  Having had childhood cancer that was treated with radiation of the abdomen. What are the signs or symptoms? Many times, there are no symptoms. If you have symptoms, they may include:  Blood coming from the rectum during a bowel movement.  Blood in the stool (feces). The blood may be bright red or very dark in color.  Pain in the abdomen.  A change in bowel habits, such as constipation or diarrhea. How is this diagnosed? This condition is diagnosed with a colonoscopy. This is a procedure in which a lighted, flexible scope is inserted into the opening between the buttocks (anus) and then passed into the colon to examine the area. Polyps are sometimes found when a colonoscopy is done as part of routine cancer screening tests. How is this treated? This condition is treated by removing any polyps that are found. Most polyps can be removed during a colonoscopy. Those polyps will then be tested for cancer. Additional treatment may be needed depending on the results of testing. Follow these instructions at home: Eating and drinking  Eat foods that are high in fiber, such as fruits, vegetables, and whole grains.  Eat foods that are high in calcium and vitamin D, such as milk, cheese, yogurt, eggs, liver, fish, and broccoli.  Limit foods that are high in fat, such as fried foods and desserts.  Limit the amount of red meat, precooked or cured meat, or other processed meat that you eat, such as hot dogs, sausages, bacon, or meat loaves.  Limit sugary drinks.   Lifestyle  Maintain a healthy weight, or lose weight if recommended by your health care provider.  Exercise every day or as told by your health care provider.  Do not use any products that contain nicotine or tobacco, such as cigarettes, e-cigarettes, and chewing tobacco. If you need help quitting, ask your health care provider.  Do not drink alcohol if: ? Your health care provider tells you not to drink. ? You are  pregnant, may be pregnant, or are planning to become pregnant.  If you drink alcohol: ? Limit how much you use to:  0-1 drink a day for women.  0-2 drinks a day for men. ? Know how much alcohol is in your drink. In the [place] one drink equals one 12 oz bottle of beer (355 mL), one 5 oz glass of wine (148 mL), or one 1 oz glass of hard liquor (44 mL). General instructions  Take over-the-counter and prescription medicines only as told by your health care provider.  Keep all follow-up visits. This is important. This includes having regularly scheduled colonoscopies. Talk to your health care provider about when you need a colonoscopy. Contact a health care provider if:  You have new or worsening bleeding during a bowel movement.  You have new or increased blood in your stool.  You have a change in bowel habits.  You lose weight for no known reason. Summary  Colon polyps are tissue growths inside  the colon, which is part of the large intestine. They are one type of polyp that can grow in the body.  Most colon polyps are noncancerous (benign), but some can become cancerous over time.  This condition is diagnosed with a colonoscopy.  This condition is treated by removing any polyps that are found. Most polyps can be removed during a colonoscopy. This information is not intended to replace advice given to you by your health care provider. Make sure you discuss any questions you have with your health care provider. Document Revised: 09/11/2019 Document Reviewed: 09/11/2019 Elsevier Patient Education  2021 [place].   PATIENT INSTRUCTIONS POST-ANESTHESIA  IMMEDIATELY FOLLOWING SURGERY:  Do not drive or operate machinery for the first twenty four hours after surgery.  Do not make any important decisions for twenty four hours after surgery or while taking narcotic pain medications or sedatives.  If you develop intractable nausea and vomiting or a severe headache please notify your  doctor immediately.  FOLLOW-UP:  Please make an appointment with your surgeon as instructed. You do not need to follow up with anesthesia unless specifically instructed to do so.  WOUND CARE INSTRUCTIONS (if applicable):  Keep a dry clean dressing on the anesthesia/puncture wound site if there is drainage.  Once the wound has quit draining you may leave it open to air.  Generally you should leave the bandage intact for twenty four hours unless there is drainage.  If the epidural site drains for more than 36-48 hours please call the anesthesia department.  QUESTIONS?:  Please feel free to call your physician or the hospital operator if you have any questions, and they will be happy to assist you.        9 polyps removed in your colon today  Further recommendations to follow pending review of pathology report

## 2020-10-12 NOTE — Anesthesia Procedure Notes (Signed)
Date/Time: 10/12/2020 1:06 PM Performed by: Orlie Dakin, CRNA Pre-anesthesia Checklist: Patient identified, Emergency Drugs available, Suction available and Patient being monitored Patient Re-evaluated:Patient Re-evaluated prior to induction Oxygen Delivery Method: Nasal cannula Induction Type: IV induction Placement Confirmation: positive ETCO2

## 2020-10-12 NOTE — Anesthesia Preprocedure Evaluation (Addendum)
Anesthesia Evaluation  Patient identified by MRN, date of birth, ID band Patient awake    Reviewed: Allergy & Precautions, NPO status , Patient's Chart, lab work & pertinent test results  History of Anesthesia Complications (+) PONV and history of anesthetic complications  Airway Mallampati: II  TM Distance: >3 FB Neck ROM: Full    Dental  (+) Edentulous Upper, Edentulous Lower   Pulmonary shortness of breath and with exertion, COPD,  COPD inhaler, Current Smoker and Patient abstained from smoking.,    Pulmonary exam normal breath sounds clear to auscultation       Cardiovascular Exercise Tolerance: Good hypertension, Pt. on medications + angina (rarely get angina) Normal cardiovascular exam Rhythm:Regular Rate:Normal     Neuro/Psych  Headaches,  Neuromuscular disease negative psych ROS   GI/Hepatic Neg liver ROS, GERD  Medicated and Controlled,  Endo/Other  negative endocrine ROS  Renal/GU negative Renal ROS     Musculoskeletal negative musculoskeletal ROS (+)   Abdominal   Peds  Hematology negative hematology ROS (+)   Anesthesia Other Findings Oral surgery for implants -10/02/2020   Reproductive/Obstetrics negative OB ROS                            Anesthesia Physical Anesthesia Plan  ASA: II  Anesthesia Plan: General   Post-op Pain Management:    Induction: Intravenous  PONV Risk Score and Plan:   Airway Management Planned: Nasal Cannula and Natural Airway  Additional Equipment:   Intra-op Plan:   Post-operative Plan:   Informed Consent: I have reviewed the patients History and Physical, chart, labs and discussed the procedure including the risks, benefits and alternatives for the proposed anesthesia with the patient or authorized representative who has indicated his/her understanding and acceptance.       Plan Discussed with: CRNA and Surgeon  Anesthesia Plan  Comments:       Anesthesia Quick Evaluation

## 2020-10-14 ENCOUNTER — Encounter: Payer: Self-pay | Admitting: Internal Medicine

## 2020-10-14 LAB — SURGICAL PATHOLOGY

## 2020-10-16 ENCOUNTER — Encounter (HOSPITAL_COMMUNITY): Payer: Self-pay | Admitting: Internal Medicine

## 2020-12-16 DIAGNOSIS — Z6823 Body mass index (BMI) 23.0-23.9, adult: Secondary | ICD-10-CM | POA: Diagnosis not present

## 2020-12-16 DIAGNOSIS — G8929 Other chronic pain: Secondary | ICD-10-CM | POA: Diagnosis not present

## 2020-12-16 DIAGNOSIS — F5101 Primary insomnia: Secondary | ICD-10-CM | POA: Diagnosis not present

## 2020-12-16 DIAGNOSIS — Z1331 Encounter for screening for depression: Secondary | ICD-10-CM | POA: Diagnosis not present

## 2020-12-21 ENCOUNTER — Other Ambulatory Visit: Payer: Self-pay | Admitting: Gastroenterology

## 2021-03-16 DIAGNOSIS — H52223 Regular astigmatism, bilateral: Secondary | ICD-10-CM | POA: Diagnosis not present

## 2021-03-16 DIAGNOSIS — H43393 Other vitreous opacities, bilateral: Secondary | ICD-10-CM | POA: Diagnosis not present

## 2021-03-16 DIAGNOSIS — H524 Presbyopia: Secondary | ICD-10-CM | POA: Diagnosis not present

## 2021-05-08 ENCOUNTER — Other Ambulatory Visit: Payer: Self-pay | Admitting: Gastroenterology

## 2021-05-12 NOTE — Telephone Encounter (Signed)
Please ask patient if he wants to switch to omeprazole for costs savings.

## 2021-05-13 NOTE — Telephone Encounter (Signed)
Lmom for pt to return my call.  

## 2021-05-14 NOTE — Telephone Encounter (Signed)
Lmom for pt to return my call.  

## 2021-05-17 NOTE — Telephone Encounter (Signed)
Tried to contact pt lmom three times and have been unsuccessful.

## 2021-05-17 NOTE — Telephone Encounter (Signed)
Pt would like to switch to Omeprazole since it is cheaper.

## 2021-05-21 ENCOUNTER — Other Ambulatory Visit: Payer: Self-pay | Admitting: Gastroenterology

## 2021-05-21 MED ORDER — OMEPRAZOLE 20 MG PO CPDR: 20.0000 mg | DELAYED_RELEASE_CAPSULE | Freq: Every day | ORAL | 3 refills | Status: DC

## 2021-05-21 NOTE — Addendum Note (Signed)
Addended by: Mahala Menghini on: 05/21/2021 11:23 AM   Modules accepted: Orders

## 2021-06-14 DIAGNOSIS — M1812 Unilateral primary osteoarthritis of first carpometacarpal joint, left hand: Secondary | ICD-10-CM | POA: Diagnosis not present

## 2021-06-14 DIAGNOSIS — G43009 Migraine without aura, not intractable, without status migrainosus: Secondary | ICD-10-CM | POA: Diagnosis not present

## 2021-06-14 DIAGNOSIS — G9519 Other vascular myelopathies: Secondary | ICD-10-CM | POA: Diagnosis not present

## 2021-06-14 DIAGNOSIS — M4800 Spinal stenosis, site unspecified: Secondary | ICD-10-CM | POA: Diagnosis not present

## 2021-08-31 DIAGNOSIS — H8110 Benign paroxysmal vertigo, unspecified ear: Secondary | ICD-10-CM | POA: Diagnosis not present

## 2021-08-31 DIAGNOSIS — Z6823 Body mass index (BMI) 23.0-23.9, adult: Secondary | ICD-10-CM | POA: Diagnosis not present

## 2021-09-15 ENCOUNTER — Other Ambulatory Visit: Payer: Self-pay | Admitting: Gastroenterology

## 2021-09-15 NOTE — Telephone Encounter (Signed)
FYI ? ?Pt is taking the Omeprazole because it is cheaper for him so the Pantoprazole is D/C ?

## 2021-09-15 NOTE — Telephone Encounter (Signed)
Looks like he is taking omeprazole daily now. Please confirm this and address with Magda Paganini when she returns tomorrow if there are further questions.  ?

## 2021-09-15 NOTE — Telephone Encounter (Signed)
Terry Morrow, ?The pt is needing a refill on his Pantoprazole SOD DR 40 mg tab. His last ov visit was 08/24/2020. According to records the original Rx was discontinued by Neil Crouch on 05/19/2021. Please advise ?

## 2021-09-16 NOTE — Telephone Encounter (Signed)
Noted  

## 2021-09-22 ENCOUNTER — Telehealth: Payer: Self-pay | Admitting: Gastroenterology

## 2021-09-22 NOTE — Telephone Encounter (Signed)
Opened in error

## 2021-11-12 DIAGNOSIS — J40 Bronchitis, not specified as acute or chronic: Secondary | ICD-10-CM | POA: Diagnosis not present

## 2021-11-12 DIAGNOSIS — H6590 Unspecified nonsuppurative otitis media, unspecified ear: Secondary | ICD-10-CM | POA: Diagnosis not present

## 2021-11-12 DIAGNOSIS — M19032 Primary osteoarthritis, left wrist: Secondary | ICD-10-CM | POA: Diagnosis not present

## 2021-11-12 DIAGNOSIS — J441 Chronic obstructive pulmonary disease with (acute) exacerbation: Secondary | ICD-10-CM | POA: Diagnosis not present

## 2022-02-24 DIAGNOSIS — J449 Chronic obstructive pulmonary disease, unspecified: Secondary | ICD-10-CM | POA: Diagnosis not present

## 2022-02-24 DIAGNOSIS — M19032 Primary osteoarthritis, left wrist: Secondary | ICD-10-CM | POA: Diagnosis not present

## 2022-02-24 DIAGNOSIS — L720 Epidermal cyst: Secondary | ICD-10-CM | POA: Diagnosis not present

## 2022-02-24 DIAGNOSIS — I10 Essential (primary) hypertension: Secondary | ICD-10-CM | POA: Diagnosis not present

## 2022-03-09 ENCOUNTER — Ambulatory Visit (INDEPENDENT_AMBULATORY_CARE_PROVIDER_SITE_OTHER): Payer: Medicare Other

## 2022-03-09 ENCOUNTER — Ambulatory Visit: Payer: Medicare Other | Admitting: Orthopaedic Surgery

## 2022-03-09 ENCOUNTER — Encounter: Payer: Self-pay | Admitting: Orthopaedic Surgery

## 2022-03-09 VITALS — BP 111/67 | HR 92 | Ht 71.0 in | Wt 165.0 lb

## 2022-03-09 DIAGNOSIS — G8929 Other chronic pain: Secondary | ICD-10-CM

## 2022-03-09 DIAGNOSIS — M545 Low back pain, unspecified: Secondary | ICD-10-CM | POA: Diagnosis not present

## 2022-03-09 MED ORDER — PREDNISONE 10 MG (21) PO TBPK: ORAL_TABLET | ORAL | 0 refills | Status: DC

## 2022-03-09 NOTE — Progress Notes (Signed)
Office Visit Note   Patient: Terry Morrow           Date of Birth: 1951/04/21           MRN: [id number] Visit Date: 03/09/2022              Requested by: Sharilyn Sites, [address],  [place] [id number] PCP: Sharilyn Sites, MD   Assessment & Plan: Visit Diagnoses:  1. Chronic bilateral low back pain, unspecified whether sciatica present     Plan: We will set patient up for some physical therapy at [facility] outpatient.  Recheck him in 5 to 6 weeks.  If he is continuing to have symptoms we can consider diagnostic imaging.  Follow-Up Instructions: No follow-ups on file.   Orders:  Orders Placed This Encounter  Procedures   XR Pelvis 1-2 Views   XR Lumbar Spine 2-3 Views   Ambulatory referral to Physical Therapy   Meds ordered this encounter  Medications   predniSONE (STERAPRED UNI-PAK 21 TAB) 10 MG (21) TBPK tablet    Sig: Take 6,5,4,3,2,1 one tablet less each day , take with food    Dispense:  21 tablet    Refill:  0      Procedures: No procedures performed   Clinical Data: No additional findings.   Subjective: Chief Complaint  Patient presents with   Lower Back - Pain    HPI 71 year old male have not seen since 2008 is here with low back pain for several months.  He previously had anterior cervical fusions by me and later had a foraminotomies by Dr. Ellene Route at [facility] 2013.  States next doing fairly well he is on chronic hydrocodone 10/325.  He has not had any lumbar procedures.  He does have COPD chronic inhalers.  He has been on gabapentin chronically.  He has back pain that radiates into his left buttocks stops just below the greater trochanter.  No problems with stairs.  Review of Systems all systems noncontributory to HPI.   Objective: Vital Signs: BP 111/67   Pulse 92   Ht '5\' 11"'$  (1.803 m)   Wt 165 lb (74.8 kg)   BMI 23.01 kg/m   Physical Exam Constitutional:      Appearance: He is well-developed.  HENT:     Head: Normocephalic  and atraumatic.     Right Ear: External ear normal.     Left Ear: External ear normal.  Eyes:     Pupils: Pupils are equal, round, and reactive to light.  Neck:     Thyroid: No thyromegaly.     Trachea: No tracheal deviation.  Cardiovascular:     Rate and Rhythm: Normal rate.  Pulmonary:     Effort: Pulmonary effort is normal.     Breath sounds: No wheezing.  Abdominal:     General: Bowel sounds are normal.     Palpations: Abdomen is soft.  Musculoskeletal:     Cervical back: Neck supple.  Skin:    General: Skin is warm and dry.     Capillary Refill: Capillary refill takes less than 2 seconds.  Neurological:     Mental Status: He is alert and oriented to person, place, and time.  Psychiatric:        Behavior: Behavior normal.        Thought Content: Thought content normal.        Judgment: Judgment normal.     Ortho Exam patient stable heel-toe walk ASA notes tenderness on  the left negative on the right negative logroll the hips knee and ankle jerk are intact no atrophy of the lower extremities.  Specialty Comments:  No specialty comments available.  Imaging: XR Pelvis 1-2 Views  Result Date: 03/09/2022 [facility] pelvis demonstrates normal sacroiliac joints.  Hip joints without degenerative changes.  Normal bone anatomy. Impression: Pelvic and hip x-rays negative for acute or significant chronic changes.  XR Lumbar Spine 2-3 Views  Result Date: 03/09/2022 [facility] lateral lumbar images are obtained and reviewed.  Patient has aortic calcification consistent with PAD.  There is right thoracolumbar curve T12-L4 measuring 20 degrees.  No spondylolisthesis.  Mild degenerative facet changes. Impression: Lumbar curvature as described above.  No acute bone changes noted.    PMFS History: Patient Active Problem List   Diagnosis Date Noted   H/O adenomatous polyp of colon 08/24/2020   Constipation 08/24/2020   FH: colon cancer in first degree relative <94 years old 08/24/2020   Encounter  for screening colonoscopy 06/23/2017   H/O failed conscious sedation 06/23/2017   GERD 01/05/2010   OTHER DYSPHAGIA 01/05/2010   Past Medical History:  Diagnosis Date   GERD (gastroesophageal reflux disease)    Headache(784.0)    MIGRAINE  --  LAST ONE ACOUPLE DAYS AGO   History of kidney stones yrs ago   Hypertension    Insomnia    Mild emphysema (HCC)    Neuromuscular disorder (HCC)    neuropathy improved since off depakote   PONV (postoperative nausea and vomiting)    Prostate disorder    enlarged   Shortness of breath    with over exertion    Family History  Problem Relation Age of Onset   Colon cancer Sister        diagnosed at age <13 several years later metastasizd   Colon cancer Maternal Grandfather     Past Surgical History:  Procedure Laterality Date   BONE ANCHORED HEARING AID IMPLANT Left 11/15/2012   Procedure: LEFT BONE ANCHORED HEARING AID (BAHA) IMPLANT;  Surgeon: Izora Gala, MD;  Location: [facility] OR;  Service: ENT;  Laterality: Left;   COLONOSCOPY WITH PROPOFOL N/A 07/20/2017   Dr. Gala Romney: Diverticulosis, 6 polyps removed, multiple tubular adenomas.  Surveillance exam advised in 3 years.   COLONOSCOPY WITH PROPOFOL N/A 10/12/2020   Procedure: COLONOSCOPY WITH PROPOFOL;  Surgeon: Daneil Dolin, MD;  Location: [facility];  Service: Endoscopy;  Laterality: N/A;  pm appt   CYSTECTOMY     larynx   CYSTOSCOPY WITH INSERTION OF UROLIFT N/A 01/15/2019   Procedure: CYSTOSCOPY WITH INSERTION OF UROLIFT;  Surgeon: Franchot Gallo, MD;  Location: [facility];  Service: Urology;  Laterality: N/A;   dental implant surgery  10/02/2020   ELBOW SURGERY Bilateral    x 2 on both   ESOPHAGOGASTRODUODENOSCOPY  2011   Dr. Gala Romney: Empiric dilation for history of dysphagia, hiatal hernia   EYE SURGERY     BIL  CATARACTS   INNER EAR SURGERY     x 2 on left   KNEE SURGERY Bilateral    x 2 both knees arthroscopic   MASTOIDECTOMY     left   NECK SURGERY     x 5 stiff at times  limited neck rom   POLYPECTOMY  07/20/2017   Procedure: POLYPECTOMY;  Surgeon: Daneil Dolin, MD;  Location: [facility];  Service: Endoscopy;;  Ileo-cecal valve (HS) x 1; Hepatic flexure(HS) x1; Descending colon(HS) x2   POLYPECTOMY  10/12/2020   Procedure:  POLYPECTOMY;  Surgeon: Daneil Dolin, MD;  Location: [facility];  Service: Endoscopy;;   ROTATOR CUFF REPAIR Right    WRIST SURGERY     left   Social History   Occupational History   Not on file  Tobacco Use   Smoking status: Every Day    Packs/day: 1.00    Years: 38.00    Total pack years: 38.00    Types: Cigarettes   Smokeless tobacco: Never  Vaping Use   Vaping Use: Never used  Substance and Sexual Activity   Alcohol use: No   Drug use: No   Sexual activity: Yes    Birth control/protection: None

## 2022-03-10 ENCOUNTER — Other Ambulatory Visit: Payer: Self-pay | Admitting: Gastroenterology

## 2022-03-20 ENCOUNTER — Other Ambulatory Visit: Payer: Self-pay | Admitting: Gastroenterology

## 2022-03-21 NOTE — Telephone Encounter (Signed)
Patient needs follow up office visit for refills.

## 2022-03-29 ENCOUNTER — Encounter: Payer: Self-pay | Admitting: Gastroenterology

## 2022-03-29 ENCOUNTER — Ambulatory Visit (INDEPENDENT_AMBULATORY_CARE_PROVIDER_SITE_OTHER): Payer: Medicare Other | Admitting: Gastroenterology

## 2022-03-29 VITALS — BP 144/70 | HR 94 | Temp 97.7°F | Ht 71.0 in | Wt 165.6 lb

## 2022-03-29 DIAGNOSIS — K219 Gastro-esophageal reflux disease without esophagitis: Secondary | ICD-10-CM | POA: Diagnosis not present

## 2022-03-29 DIAGNOSIS — K5903 Drug induced constipation: Secondary | ICD-10-CM | POA: Diagnosis not present

## 2022-03-29 MED ORDER — PANTOPRAZOLE SODIUM 40 MG PO TBEC: 40.0000 mg | DELAYED_RELEASE_TABLET | Freq: Every day | ORAL | 3 refills | Status: DC

## 2022-03-29 MED ORDER — LINACLOTIDE 290 MCG PO CAPS: 290.0000 ug | ORAL_CAPSULE | Freq: Every day | ORAL | 3 refills | Status: DC

## 2022-03-29 NOTE — Progress Notes (Signed)
GI Office Note    Referring Provider: Sharilyn Sites, MD Primary Care Physician:  Sharilyn Sites, MD  Primary Gastroenterologist: Garfield Cornea, MD   Chief Complaint   Chief Complaint  Patient presents with   Gastroesophageal Reflux    Here for further refills.    History of Present Illness   Terry Morrow is a 71 y.o. male presenting today for follow-up.  Last seen in March 2022 for follow-up of colon polyps, constipation, GERD.  Weight stable. Was doing well on pantoprazole but it was cheaper to get omeprazole. PCP switched his RX. Unfortunately he now requiring omeprazole '40mg'$  twice daily to control his symptoms. He would like to go back on pantoprazole '40mg'$  daily because it was controlling his heartburn. He denies dysphagia, vomiting, abdominal pain. Amitiza 22mg BID not controlling constipation. He has a BM once every 2-3 days if he is "lucky" and still [place] 1. No melena, brbpr.   Colonoscopy May 2022: - Nine 4 to 7 mm polyps in the descending colon and in the ascending colon, removed with a cold snare. Resected and retrieved. - The examination was otherwise normal on direct and retroflexion views. -Multiple tubular adenomas on path -Surveillance colonoscopy recommended in 3 years  EGD 2011 -Empiric dilation for history of dysphagia -Hiatal hernia  Medications   Current Outpatient Medications  Medication Sig Dispense Refill   albuterol (PROVENTIL) (2.5 MG/3ML) 0.083% nebulizer solution Take 2.5 mg by nebulization daily as needed for wheezing or shortness of breath. Shortness of breathe     albuterol (VENTOLIN HFA) 108 (90 Base) MCG/ACT inhaler Inhale 2 puffs into the lungs every 6 (six) hours as needed for wheezing or shortness of breath.     amitriptyline (ELAVIL) 100 MG tablet Take 100 mg by mouth at bedtime.     gabapentin (NEURONTIN) 300 MG capsule Take 300 mg by mouth at bedtime.     Glycerin-Hypromellose-PEG 400 (DRY EYE RELIEF DROPS) 0.2-0.2-1 % SOLN  Place 1 drop into both eyes daily as needed (Dry eyes).     HYDROcodone-acetaminophen (NORCO) 10-325 MG tablet Take 1 tablet by mouth every 6 (six) hours as needed for severe pain or moderate pain.     lubiprostone (AMITIZA) 24 MCG capsule TAKE 1 CAPSULE BY MOUTH ONCE OR TWICE DAILY TO TREAT CONSTIPATION. TAKE WITH FOOD 180 capsule 1   Multiple Vitamins-Minerals (ONE-A-DAY MENS 50+ PO) Take 1 tablet by mouth daily.     nitroGLYCERIN (NITROSTAT) 0.4 MG SL tablet Place 0.4 mg under the tongue every 5 (five) minutes as needed for chest pain.      omeprazole (PRILOSEC) 40 MG capsule Take 40 mg by mouth 2 (two) times daily before a meal.     pravastatin (PRAVACHOL) 40 MG tablet Take 40 mg by mouth at bedtime.     QUEtiapine (SEROQUEL) 50 MG tablet Take 50 mg by mouth at bedtime.     SUMAtriptan (IMITREX) 100 MG tablet Take 50 mg by mouth every 2 (two) hours as needed for migraine or headache.      verapamil (CALAN-SR) 180 MG CR tablet Take 180 mg by mouth at bedtime.     zolpidem (AMBIEN) 10 MG tablet Take 10 mg by mouth at bedtime.     No current facility-administered medications for this visit.    Allergies   Allergies as of 03/29/2022   (No Known Allergies)        Review of Systems   General: Negative for anorexia, weight loss, fever, chills, fatigue,  weakness. ENT: Negative for hoarseness, difficulty swallowing , nasal congestion. CV: Negative for chest pain, angina, palpitations, dyspnea on exertion, peripheral edema.  Respiratory: Negative for dyspnea at rest, dyspnea on exertion, cough, sputum, wheezing.  GI: See history of present illness. GU:  Negative for dysuria, hematuria, urinary incontinence, urinary frequency, nocturnal urination.  Endo: Negative for unusual weight change.     Physical Exam   BP (!) 144/70 (BP Location: Right Arm, Patient Position: Sitting, Cuff Size: Normal)   Pulse 94   Temp 97.7 F (36.5 C) (Oral)   Ht '5\' 11"'$  (1.803 m)   Wt 165 lb 9.6 oz (75.1 kg)    SpO2 99%   BMI 23.10 kg/m    General: Well-nourished, well-developed in no acute distress.  Eyes: No icterus. Mouth: Oropharyngeal mucosa moist and pink , no lesions erythema or exudate. Abdomen: Bowel sounds are normal, nontender, nondistended, no hepatosplenomegaly or masses,  no abdominal bruits or hernia , no rebound or guarding.  Rectal: not performed  Neuro: Alert and oriented x 4   Skin: Warm and dry, no jaundice.   Psych: Alert and cooperative, normal mood and affect.  Labs   None available.  Imaging Studies   XR Pelvis 1-2 Views  Result Date: 03/09/2022 [facility] pelvis demonstrates normal sacroiliac joints.  Hip joints without degenerative changes.  Normal bone anatomy. Impression: Pelvic and hip x-rays negative for acute or significant chronic changes.  XR Lumbar Spine 2-3 Views  Result Date: 03/09/2022 [facility] lateral lumbar images are obtained and reviewed.  Patient has aortic calcification consistent with PAD.  There is right thoracolumbar curve T12-L4 measuring 20 degrees.  No spondylolisthesis.  Mild degenerative facet changes. Impression: Lumbar curvature as described above.  No acute bone changes noted.   Assessment   Constipation: poorly controlled on Amitiza 22mg BID.   GERD: refractory to H2 blockers in th past. Was doing well on pantoprazole '40mg'$  daily but switched to cheaper option of omeprazole but now taking '40mg'$  BID to control symptoms. No alarm symptoms.    PLAN   Switch back to pantoprazole '40mg'$  daily before breakfast.  Switch to [place] 2930m daily.  Return to the office in one year or sooner if needed. He should call if difficulty with medication coverage/cost/efficacy.    LeLaureen OchsLeBobby RumpfM[facility]

## 2022-03-29 NOTE — Patient Instructions (Signed)
I have sent in pantoprazole '40mg'$  daily before breakfast for acid reflux. Stop omeprazole. I have sent in [place] 240mg daily before breakfast for constipation. Stop lubiprostone.  Call if medications are not helping or too expensive.  Return to the office in one year or sooner if needed.

## 2022-04-11 ENCOUNTER — Ambulatory Visit (HOSPITAL_COMMUNITY): Payer: Medicare Other | Attending: Orthopaedic Surgery | Admitting: Physical Therapy

## 2022-04-11 ENCOUNTER — Encounter (HOSPITAL_COMMUNITY): Payer: Self-pay | Admitting: Physical Therapy

## 2022-04-11 DIAGNOSIS — R29898 Other symptoms and signs involving the musculoskeletal system: Secondary | ICD-10-CM | POA: Diagnosis not present

## 2022-04-11 DIAGNOSIS — M545 Low back pain, unspecified: Secondary | ICD-10-CM | POA: Diagnosis not present

## 2022-04-11 DIAGNOSIS — R2689 Other abnormalities of gait and mobility: Secondary | ICD-10-CM | POA: Diagnosis not present

## 2022-04-11 DIAGNOSIS — G8929 Other chronic pain: Secondary | ICD-10-CM | POA: Diagnosis not present

## 2022-04-11 DIAGNOSIS — M6281 Muscle weakness (generalized): Secondary | ICD-10-CM | POA: Diagnosis not present

## 2022-04-11 DIAGNOSIS — M5459 Other low back pain: Secondary | ICD-10-CM | POA: Diagnosis not present

## 2022-04-11 NOTE — Therapy (Signed)
OUTPATIENT PHYSICAL THERAPY THORACOLUMBAR EVALUATION   Patient Name: Terry Morrow MRN: [id number] DOB:March 04, 1951, 71 y.o., male Today's Date: 04/11/2022   PT End of Session - 04/11/22 1432     Visit Number 1    Number of Visits 12    Date for PT Re-Evaluation 05/23/22    Authorization Type BCBS Medicare (no auth, no VL)    PT Start Time 1432    PT Stop Time 1508    PT Time Calculation (min) 36 min    Activity Tolerance Patient tolerated treatment well    Behavior During Therapy WFL for tasks assessed/performed             Past Medical History:  Diagnosis Date   GERD (gastroesophageal reflux disease)    Headache(784.0)    MIGRAINE  --  LAST ONE ACOUPLE DAYS AGO   History of kidney stones yrs ago   Hypertension    Insomnia    Mild emphysema (HCC)    Neuromuscular disorder (HCC)    neuropathy improved since off depakote   PONV (postoperative nausea and vomiting)    Prostate disorder    enlarged   Shortness of breath    with over exertion   Past Surgical History:  Procedure Laterality Date   BONE ANCHORED HEARING AID IMPLANT Left 11/15/2012   Procedure: LEFT BONE ANCHORED HEARING AID (BAHA) IMPLANT;  Surgeon: Izora Gala, MD;  Location: [facility];  Service: ENT;  Laterality: Left;   COLONOSCOPY WITH PROPOFOL N/A 07/20/2017   Dr. Gala Romney: Diverticulosis, 6 polyps removed, multiple tubular adenomas.  Surveillance exam advised in 3 years.   COLONOSCOPY WITH PROPOFOL N/A 10/12/2020   Procedure: COLONOSCOPY WITH PROPOFOL;  Surgeon: Daneil Dolin, MD;  Location: [facility];  Service: Endoscopy;  Laterality: N/A;  pm appt   CYSTECTOMY     larynx   CYSTOSCOPY WITH INSERTION OF UROLIFT N/A 01/15/2019   Procedure: CYSTOSCOPY WITH INSERTION OF UROLIFT;  Surgeon: Franchot Gallo, MD;  Location: [facility];  Service: Urology;  Laterality: N/A;   dental implant surgery  10/02/2020   ELBOW SURGERY Bilateral    x 2 on both   ESOPHAGOGASTRODUODENOSCOPY  2011   Dr. Gala Romney: Empiric  dilation for history of dysphagia, hiatal hernia   EYE SURGERY     BIL  CATARACTS   INNER EAR SURGERY     x 2 on left   KNEE SURGERY Bilateral    x 2 both knees arthroscopic   MASTOIDECTOMY     left   NECK SURGERY     x 5 stiff at times limited neck rom   POLYPECTOMY  07/20/2017   Procedure: POLYPECTOMY;  Surgeon: Daneil Dolin, MD;  Location: [facility];  Service: Endoscopy;;  Ileo-cecal valve (HS) x 1; Hepatic flexure(HS) x1; Descending colon(HS) x2   POLYPECTOMY  10/12/2020   Procedure: POLYPECTOMY;  Surgeon: Daneil Dolin, MD;  Location: [facility];  Service: Endoscopy;;   ROTATOR CUFF REPAIR Right    WRIST SURGERY     left   Patient Active Problem List   Diagnosis Date Noted   H/O adenomatous polyp of colon 08/24/2020   Constipation 08/24/2020   FH: colon cancer in first degree relative <23 years old 08/24/2020   Encounter for screening colonoscopy 06/23/2017   H/O failed conscious sedation 06/23/2017   GERD 01/05/2010   OTHER DYSPHAGIA 01/05/2010    PCP: Sharilyn Sites  REFERRING PROVIDER: Marybelle Killings, MD  REFERRING DIAG: [phone] (ICD-10-CM) - Chronic bilateral  low back pain, unspecified whether sciatica presen  Rationale for Evaluation and Treatment: Rehabilitation  THERAPY DIAG:  Other low back pain  Muscle weakness (generalized)  Other abnormalities of gait and mobility  Other symptoms and signs involving the musculoskeletal system  ONSET DATE: several months  SUBJECTIVE:                                                                                                                                                                                           SUBJECTIVE STATEMENT: Patient states he always had trouble with his back but it started going down his leg to L calf about 2-3 months ago. He woke up Friday and had it in his right leg but it got better. He has increased symptoms with sitting in gluteal region with symptoms into calf. Symptoms  worse with yard work. Back hurts with yard work too. Kneeling tends to ease symptoms.   PERTINENT HISTORY:  Hx anterior cervical fusions and foraminotomies C7-T1  PAIN:  Are you having pain? Yes: NPRS scale: 2-3/10 Pain location: low back/L buttock Pain description: aching Aggravating factors: sitting, yard work Relieving factors: kneeling, ice, heat  PRECAUTIONS: None  WEIGHT BEARING RESTRICTIONS: No  FALLS:  Has patient fallen in last 6 months? No  LIVING ENVIRONMENT: Lives with: lives with their spouse Lives in: House/apartment Stairs: Yes: External: 3 steps; on right going up and on left going up Has following equipment at home: None  OCCUPATION: Retired  PLOF: Independent  PATIENT GOALS: decrease pain  NEXT MD VISIT: 04/14/22  OBJECTIVE:   DIAGNOSTIC FINDINGS:  XR Lumbar Spine 2-3 Views  Result Date: 03/09/2022 [facility] lateral lumbar images are obtained and reviewed.  Patient has aortic calcification consistent with PAD.  There is right thoracolumbar curve T12-L4 measuring 20 degrees.  No spondylolisthesis.  Mild degenerative facet changes. Impression: Lumbar curvature as described above.  No acute bone changes noted  PATIENT SURVEYS:  FOTO 53 % function  SCREENING FOR RED FLAGS: Bowel or bladder incontinence: No Spinal tumors: No Cauda equina syndrome: No Compression fracture: No Abdominal aneurysm: No  COGNITION: Overall cognitive status: Within functional limits for tasks assessed     SENSATION: WFL  Observation:  frequent weight shift off L with sitting/standing  POSTURE: decreased lumbar lordosis and decreased thoracic kyphosis  PALPATION: Minimal TTP L glute max/piriformis, hypomobile thoracic and lumbar spine CPA  LUMBAR ROM:   AROM eval  Flexion 25% limited (pain in low back, buttock)  Extension 25% limited  Right lateral flexion 0% limited  Left lateral flexion 25% limited  Right rotation 0% limited  Left rotation 0% limited   (Blank  rows  = not tested) *=pain  LOWER EXTREMITY ROM:   WFL for tasks assessed  Active  Right eval Left eval  Hip flexion    Hip extension    Hip abduction    Hip adduction    Hip internal rotation    Hip external rotation    Knee flexion    Knee extension    Ankle dorsiflexion    Ankle plantarflexion    Ankle inversion    Ankle eversion     (Blank rows = not tested)  LOWER EXTREMITY MMT:    MMT Right eval Left eval  Hip flexion 5 5  Hip extension 5 4  Hip abduction 5 4+  Hip adduction    Hip internal rotation    Hip external rotation    Knee flexion 5 5  Knee extension 5 5  Ankle dorsiflexion 5 5  Ankle plantarflexion    Ankle inversion    Ankle eversion     (Blank rows = not tested)  LUMBAR SPECIAL TESTS:  Repeated lumbar extension in standing 2 x 10 no change in symptoms  FUNCTIONAL TESTS:  5 times sit to stand: 9.72 without UE use 2 minute walk test: 375  GAIT: Distance walked: 375 feet Assistive device utilized: None Level of assistance: Complete Independence Comments: 2MWT; L knee flexed in stance with slight increasing antalgic, no increase in back or radicular symptoms  TODAY'S TREATMENT:                                                                                                                              DATE: 04/11/22 Bridge 2x 10 Supine ab set x 10 with 5 second holds    PATIENT EDUCATION:  Education details: Patient educated on exam findings, POC, scope of PT, HEP Person educated: Patient Education method: Consulting civil engineer, Demonstration, and Handouts Education comprehension: verbalized understanding, returned demonstration, verbal cues required, and tactile cues required  HOME EXERCISE PROGRAM: Access Code: [email or web address]  Date: 04/11/2022 - Supine Bridge  - 2-3 x daily - 7 x weekly - 2 sets - 10 reps - Abdominal Bracing  - 2-3 x daily - 7 x weekly - 2 sets - 10 reps - 5 second hold  ASSESSMENT:  CLINICAL IMPRESSION: Patient a 71 y.o. y.o. male  who was seen today for physical therapy evaluation and treatment for chronic LBP. Patient presents with pain limited deficits in lumbar spine and hip strength, ROM, endurance, activity tolerance, and functional mobility with ADL. Patient is having to modify and restrict ADL as indicated by outcome measure score as well as subjective information and objective measures which is affecting overall participation. Patient will benefit from skilled physical therapy in order to improve function and reduce impairment.  OBJECTIVE IMPAIRMENTS: decreased activity tolerance, decreased endurance, decreased mobility, difficulty walking, decreased ROM, decreased strength, increased muscle spasms, impaired flexibility, improper body mechanics, postural dysfunction, and pain.   ACTIVITY LIMITATIONS: carrying, lifting, bending, standing, squatting, transfers, reach  over head, locomotion level, and caring for others  PARTICIPATION LIMITATIONS: meal prep, cleaning, laundry, community activity, and yard work  PERSONAL FACTORS: Age, Time since onset of injury/illness/exacerbation, and 1 comorbidity: chronic LBP  are also affecting patient's functional outcome.   REHAB POTENTIAL: Good  CLINICAL DECISION MAKING: Stable/uncomplicated  EVALUATION COMPLEXITY: Low   GOALS: Goals reviewed with patient? Yes  SHORT TERM GOALS: Target date: 05/02/2022  Patient will be independent with HEP in order to improve functional outcomes. Baseline:  Goal status: INITIAL  2.  Patient will report at least 25% improvement in symptoms for improved quality of life. Baseline:  Goal status: INITIAL    LONG TERM GOALS: Target date: 05/23/2022  Patient will report at least 75% improvement in symptoms for improved quality of life. Baseline:  Goal status: INITIAL  2.  Patient will improve FOTO score by at least 12 points in order to indicate improved tolerance to activity. Baseline: 53% function Goal status: INITIAL  3.   Patient will demonstrate at least 25% improvement in lumbar ROM in all restricted planes for improved ability to move trunk while completing chores. Baseline: see above Goal status: INITIAL  4.  Patient will report improved ability to complete yard work  in order to take care of his home. Baseline: limited Goal status: INITIAL  5.  Patient will demonstrate grade of 5/5 MMT grade in all tested musculature as evidence of improved strength to assist with stair ambulation and gait.   Baseline: see above Goal status: INITIAL    PLAN:  PT FREQUENCY: 2x/week  PT DURATION: 6 weeks  PLANNED INTERVENTIONS: Therapeutic exercises, Therapeutic activity, Neuromuscular re-education, Balance training, Gait training, Patient/Family education, Joint manipulation, Joint mobilization, Stair training, Orthotic/Fit training, DME instructions, Aquatic Therapy, Dry Needling, Electrical stimulation, Spinal manipulation, Spinal mobilization, Cryotherapy, Moist heat, Compression bandaging, scar mobilization, Splintting, Taping, Traction, Ultrasound, Ionotophoresis '4mg'$ /ml Dexamethasone, and Manual therapy   PLAN FOR NEXT SESSION: f/u with HEP, core and glute strength, spinal mobility, postural strengthening   Mearl Latin, PT 04/11/2022, 3:11 PM

## 2022-04-13 ENCOUNTER — Ambulatory Visit (HOSPITAL_COMMUNITY): Payer: Medicare Other

## 2022-04-13 ENCOUNTER — Encounter (HOSPITAL_COMMUNITY): Payer: Self-pay

## 2022-04-13 DIAGNOSIS — M6281 Muscle weakness (generalized): Secondary | ICD-10-CM

## 2022-04-13 DIAGNOSIS — R2689 Other abnormalities of gait and mobility: Secondary | ICD-10-CM | POA: Diagnosis not present

## 2022-04-13 DIAGNOSIS — M5459 Other low back pain: Secondary | ICD-10-CM

## 2022-04-13 DIAGNOSIS — M545 Low back pain, unspecified: Secondary | ICD-10-CM | POA: Diagnosis not present

## 2022-04-13 DIAGNOSIS — R29898 Other symptoms and signs involving the musculoskeletal system: Secondary | ICD-10-CM | POA: Diagnosis not present

## 2022-04-13 DIAGNOSIS — G8929 Other chronic pain: Secondary | ICD-10-CM | POA: Diagnosis not present

## 2022-04-13 NOTE — Therapy (Signed)
OUTPATIENT PHYSICAL THERAPY THORACOLUMBAR EVALUATION   Patient Name: Terry Morrow MRN: [id number] DOB:[birth year], 71 y.o., male Today's Date: 04/13/2022   PT End of Session - 04/13/22 1141     Visit Number 2    Number of Visits 12    Date for PT Re-Evaluation 05/23/22    Authorization Type BCBS Medicare (no auth, no VL)    Progress Note Due on Visit 10    PT Start Time 1118    PT Stop Time 1158    PT Time Calculation (min) 40 min    Activity Tolerance Patient tolerated treatment well    Behavior During Therapy WFL for tasks assessed/performed              Past Medical History:  Diagnosis Date   GERD (gastroesophageal reflux disease)    Headache(784.0)    MIGRAINE  --  LAST ONE ACOUPLE DAYS AGO   History of kidney stones yrs ago   Hypertension    Insomnia    Mild emphysema (HCC)    Neuromuscular disorder (HCC)    neuropathy improved since off depakote   PONV (postoperative nausea and vomiting)    Prostate disorder    enlarged   Shortness of breath    with over exertion   Past Surgical History:  Procedure Laterality Date   BONE ANCHORED HEARING AID IMPLANT Left 11/15/2012   Procedure: LEFT BONE ANCHORED HEARING AID (BAHA) IMPLANT;  Surgeon: Izora Gala, MD;  Location: [facility];  Service: ENT;  Laterality: Left;   COLONOSCOPY WITH PROPOFOL N/A 07/20/2017   Dr. Gala Romney: Diverticulosis, 6 polyps removed, multiple tubular adenomas.  Surveillance exam advised in 3 years.   COLONOSCOPY WITH PROPOFOL N/A 10/12/2020   Procedure: COLONOSCOPY WITH PROPOFOL;  Surgeon: Daneil Dolin, MD;  Location: [facility];  Service: Endoscopy;  Laterality: N/A;  pm appt   CYSTECTOMY     larynx   CYSTOSCOPY WITH INSERTION OF UROLIFT N/A 01/15/2019   Procedure: CYSTOSCOPY WITH INSERTION OF UROLIFT;  Surgeon: Franchot Gallo, MD;  Location: [facility];  Service: Urology;  Laterality: N/A;   dental implant surgery  10/02/2020   ELBOW SURGERY Bilateral    x 2 on both    ESOPHAGOGASTRODUODENOSCOPY  2011   Dr. Gala Romney: Empiric dilation for history of dysphagia, hiatal hernia   EYE SURGERY     BIL  CATARACTS   INNER EAR SURGERY     x 2 on left   KNEE SURGERY Bilateral    x 2 both knees arthroscopic   MASTOIDECTOMY     left   NECK SURGERY     x 5 stiff at times limited neck rom   POLYPECTOMY  07/20/2017   Procedure: POLYPECTOMY;  Surgeon: Daneil Dolin, MD;  Location: [facility];  Service: Endoscopy;;  Ileo-cecal valve (HS) x 1; Hepatic flexure(HS) x1; Descending colon(HS) x2   POLYPECTOMY  10/12/2020   Procedure: POLYPECTOMY;  Surgeon: Daneil Dolin, MD;  Location: [facility];  Service: Endoscopy;;   ROTATOR CUFF REPAIR Right    WRIST SURGERY     left   Patient Active Problem List   Diagnosis Date Noted   H/O adenomatous polyp of colon 08/24/2020   Constipation 08/24/2020   FH: colon cancer in first degree relative <75 years old 08/24/2020   Encounter for screening colonoscopy 06/23/2017   H/O failed conscious sedation 06/23/2017   GERD 01/05/2010   OTHER DYSPHAGIA 01/05/2010    PCP: Sharilyn Sites  REFERRING PROVIDER: Rodell Perna  C, MD  REFERRING DIAG: M54.50,G89.29 (ICD-10-CM) - Chronic bilateral low back pain, unspecified whether sciatica presen  Rationale for Evaluation and Treatment: Rehabilitation  THERAPY DIAG:  Other low back pain  Muscle weakness (generalized)  Other abnormalities of gait and mobility  Other symptoms and signs involving the musculoskeletal system  ONSET DATE: several months  SUBJECTIVE:                                                                                                                                                                                           SUBJECTIVE STATEMENT: Pt reports he woke up with headache this morning.  Reports his back is feeling good today, has began HEP without questions.    Eval subjective:  Patient states he always had trouble with his back but it started  going down his leg to L calf about 2-3 months ago. He woke up Friday and had it in his right leg but it got better. He has increased symptoms with sitting in gluteal region with symptoms into calf. Symptoms worse with yard work. Back hurts with yard work too. Kneeling tends to ease symptoms.   PERTINENT HISTORY:  Hx anterior cervical fusions and foraminotomies C7-T1  PAIN:  Are you having pain? Yes: NPRS scale: 2-3/10 Pain location: low back/L buttock Pain description: aching Aggravating factors: sitting, yard work Relieving factors: kneeling, ice, heat  PRECAUTIONS: None  WEIGHT BEARING RESTRICTIONS: No  FALLS:  Has patient fallen in last 6 months? No  LIVING ENVIRONMENT: Lives with: lives with their spouse Lives in: House/apartment Stairs: Yes: External: 3 steps; on right going up and on left going up Has following equipment at home: None  OCCUPATION: Retired  PLOF: Independent  PATIENT GOALS: decrease pain  NEXT MD VISIT: 04/14/22  OBJECTIVE:   DIAGNOSTIC FINDINGS:  XR Lumbar Spine 2-3 Views  Result Date: 03/09/2022 [facility] lateral lumbar images are obtained and reviewed.  Patient has aortic calcification consistent with PAD.  There is right thoracolumbar curve T12-L4 measuring 20 degrees.  No spondylolisthesis.  Mild degenerative facet changes. Impression: Lumbar curvature as described above.  No acute bone changes noted  PATIENT SURVEYS:  FOTO 53 % function  SCREENING FOR RED FLAGS: Bowel or bladder incontinence: No Spinal tumors: No Cauda equina syndrome: No Compression fracture: No Abdominal aneurysm: No  COGNITION: Overall cognitive status: Within functional limits for tasks assessed     SENSATION: WFL  Observation:  frequent weight shift off L with sitting/standing  POSTURE: decreased lumbar lordosis and decreased thoracic kyphosis  PALPATION: Minimal TTP L glute max/piriformis, hypomobile thoracic and lumbar spine CPA  LUMBAR ROM:   AROM eval  Flexion 25% limited (pain in low back, buttock)  Extension 25% limited  Right lateral flexion 0% limited  Left lateral flexion 25% limited  Right rotation 0% limited  Left rotation 0% limited   (Blank rows = not tested) *=pain  LOWER EXTREMITY ROM:   WFL for tasks assessed  Active  Right eval Left eval  Hip flexion    Hip extension    Hip abduction    Hip adduction    Hip internal rotation    Hip external rotation    Knee flexion    Knee extension    Ankle dorsiflexion    Ankle plantarflexion    Ankle inversion    Ankle eversion     (Blank rows = not tested)  LOWER EXTREMITY MMT:    MMT Right eval Left eval  Hip flexion 5 5  Hip extension 5 4  Hip abduction 5 4+  Hip adduction    Hip internal rotation    Hip external rotation    Knee flexion 5 5  Knee extension 5 5  Ankle dorsiflexion 5 5  Ankle plantarflexion    Ankle inversion    Ankle eversion     (Blank rows = not tested)  LUMBAR SPECIAL TESTS:  Repeated lumbar extension in standing 2 x 10 no change in symptoms  FUNCTIONAL TESTS:  5 times sit to stand: 9.72 without UE use 2 minute walk test: 375  GAIT: Distance walked: 375 feet Assistive device utilized: None Level of assistance: Complete Independence Comments: 2MWT; L knee flexed in stance with slight increasing antalgic, no increase in back or radicular symptoms  TODAY'S TREATMENT:                                                                                                                              DATE:  2022-05-13: Reviewed goals, educated importance of HEP compliance, pt able to recall and demonstrate appropriate mechanics Supine: Bridge 2x 10 5" holds Educated importance of breathing through session Diaphragmatic breathing x 1' Abdominal sets paired with exhalation holding 3" Dead bug with ab set 10x LTR 5x 10" holds Prone: POE x 2'  Heel squeeze 10x 5" Standing 3D hip excursion 10x (weight shift, rotation, squat front of bed,  extension   04/11/22 Bridge 2x 10 Supine ab set x 10 with 5 second holds    PATIENT EDUCATION:  Education details: Patient educated on exam findings, POC, scope of PT, HEP Person educated: Patient Education method: Consulting civil engineer, Demonstration, and Handouts Education comprehension: verbalized understanding, returned demonstration, verbal cues required, and tactile cues required  HOME EXERCISE PROGRAM: Access Code: [email or web address]  Date: 04/11/2022 - Supine Bridge  - 2-3 x daily - 7 x weekly - 2 sets - 10 reps - Abdominal Bracing  - 2-3 x daily - 7 x weekly - 2 sets - 10 reps - 5 second hold  05/13/22: Dead bug and POE  ASSESSMENT:  CLINICAL IMPRESSION: Reviewed goals, educated importance of  HEP compliance for maximal benefits, pt able to recall bridge.  Pt admits to tendency to hold breath with exercises, educated importance of breathing through all exercises and began abdominal sets pairing with exhalation with min cueing required.  Session focus with core and proximal strengthening and lumbar mobility with good mechanics following initial education and no reports of pain through session.  Added to HEP for core strengthening and lumbar mobility with addition printout given.    OBJECTIVE IMPAIRMENTS: decreased activity tolerance, decreased endurance, decreased mobility, difficulty walking, decreased ROM, decreased strength, increased muscle spasms, impaired flexibility, improper body mechanics, postural dysfunction, and pain.   ACTIVITY LIMITATIONS: carrying, lifting, bending, standing, squatting, transfers, reach over head, locomotion level, and caring for others  PARTICIPATION LIMITATIONS: meal prep, cleaning, laundry, community activity, and yard work  PERSONAL FACTORS: Age, Time since onset of injury/illness/exacerbation, and 1 comorbidity: chronic LBP  are also affecting patient's functional outcome.   REHAB POTENTIAL: Good  CLINICAL DECISION MAKING:  Stable/uncomplicated  EVALUATION COMPLEXITY: Low   GOALS: Goals reviewed with patient? Yes  SHORT TERM GOALS: Target date: 05/02/2022  Patient will be independent with HEP in order to improve functional outcomes. Baseline:  Goal status: IN PROGRESS  2.  Patient will report at least 25% improvement in symptoms for improved quality of life. Baseline:  Goal status: IN PROGRESS    LONG TERM GOALS: Target date: 05/23/2022  Patient will report at least 75% improvement in symptoms for improved quality of life. Baseline:  Goal status: IN PROGRESS  2.  Patient will improve FOTO score by at least 12 points in order to indicate improved tolerance to activity. Baseline: 53% function Goal status: IN PROGRESS  3.  Patient will demonstrate at least 25% improvement in lumbar ROM in all restricted planes for improved ability to move trunk while completing chores. Baseline: see above Goal status: IN PROGRESS  4.  Patient will report improved ability to complete yard work  in order to take care of his home. Baseline: limited Goal status: IN PROGRESS  5.  Patient will demonstrate grade of 5/5 MMT grade in all tested musculature as evidence of improved strength to assist with stair ambulation and gait.   Baseline: see above Goal status: IN PROGRESS    PLAN:  PT FREQUENCY: 2x/week  PT DURATION: 6 weeks  PLANNED INTERVENTIONS: Therapeutic exercises, Therapeutic activity, Neuromuscular re-education, Balance training, Gait training, Patient/Family education, Joint manipulation, Joint mobilization, Stair training, Orthotic/Fit training, DME instructions, Aquatic Therapy, Dry Needling, Electrical stimulation, Spinal manipulation, Spinal mobilization, Cryotherapy, Moist heat, Compression bandaging, scar mobilization, Splintting, Taping, Traction, Ultrasound, Ionotophoresis '4mg'$ /ml Dexamethasone, and Manual therapy   PLAN FOR NEXT SESSION: Core and glute strength, spinal mobility, postural  strengthening  Ihor Austin, LPTA/CLT; CBIS [phone]  Aldona Lento, PTA 04/13/2022, 12:01 PM

## 2022-04-14 ENCOUNTER — Ambulatory Visit (INDEPENDENT_AMBULATORY_CARE_PROVIDER_SITE_OTHER): Payer: Medicare Other | Admitting: Orthopaedic Surgery

## 2022-04-14 ENCOUNTER — Encounter: Payer: Self-pay | Admitting: Orthopaedic Surgery

## 2022-04-14 VITALS — Ht 71.0 in | Wt 165.0 lb

## 2022-04-14 DIAGNOSIS — M545 Low back pain, unspecified: Secondary | ICD-10-CM

## 2022-04-14 NOTE — Progress Notes (Signed)
Office Visit Note   Patient: Terry Morrow           Date of Birth: 01-01-1951           MRN: [id number] Visit Date: 04/14/2022              Requested by: Sharilyn Sites, [address] [place],  [place] [address] PCP: Sharilyn Sites, MD   Assessment & Plan: Visit Diagnoses:  1. Low back pain without sciatica, unspecified back pain laterality, unspecified chronicity     Plan: Patient is only had 1 physical therapy visit since there was a delay getting him scheduled.  He will continue with his therapy is already noted 50% improvement and he is having persistent problems he will call let us know.  He is happy with results so far with the prednisone Dosepak, ibuprofen and now the 1 therapy visit.  Follow-Up Instructions: No follow-ups on file.   Orders:  No orders of the defined types were placed in this encounter.  No orders of the defined types were placed in this encounter.     Procedures: No procedures performed   Clinical Data: No additional findings.   Subjective: Chief Complaint  Patient presents with   Lower Back - Pain, Follow-up    HPI 71 year old male returns he states since last visit a month ago he is about 50% better.  He said 1 PT visit has more scheduled.  The prednisone Dosepak gave him some improvement.  He is using ibuprofen currently.  Review of Systems all systems updated unchanged from 03/09/2022 office visit.   Objective: Vital Signs: Ht '5\' 11"'$  (1.803 m)   Wt 165 lb (74.8 kg)   BMI 23.01 kg/m   Physical Exam Constitutional:      Appearance: He is well-developed.  HENT:     Head: Normocephalic and atraumatic.     Right Ear: External ear normal.     Left Ear: External ear normal.  Eyes:     Pupils: Pupils are equal, round, and reactive to light.  Neck:     Thyroid: No thyromegaly.     Trachea: No tracheal deviation.  Cardiovascular:     Rate and Rhythm: Normal rate.  Pulmonary:     Effort: Pulmonary effort is normal.      Breath sounds: No wheezing.  Abdominal:     General: Bowel sounds are normal.     Palpations: Abdomen is soft.  Musculoskeletal:     Cervical back: Neck supple.  Skin:    General: Skin is warm and dry.     Capillary Refill: Capillary refill takes less than 2 seconds.  Neurological:     Mental Status: He is alert and oriented to person, place, and time.  Psychiatric:        Behavior: Behavior normal.        Thought Content: Thought content normal.        Judgment: Judgment normal.     Ortho Exam patient has decreased cervical range of motion well-healed cervical incisions fusions.  Posterior foraminotomy incision.  He gets from sitting standing normal heel-toe gait amatory without limping negative logroll the hips.  No rash over exposed skin.  Specialty Comments:  No specialty comments available.  Imaging: No results found.   PMFS History: Patient Active Problem List   Diagnosis Date Noted   Low back pain 04/14/2022   H/O adenomatous polyp of colon 08/24/2020   Constipation 08/24/2020   FH: colon cancer in first degree relative <  23 years old 08/24/2020   Encounter for screening colonoscopy 06/23/2017   H/O failed conscious sedation 06/23/2017   GERD 01/05/2010   OTHER DYSPHAGIA 01/05/2010   Past Medical History:  Diagnosis Date   GERD (gastroesophageal reflux disease)    Headache(784.0)    MIGRAINE  --  LAST ONE ACOUPLE DAYS AGO   History of kidney stones yrs ago   Hypertension    Insomnia    Mild emphysema (HCC)    Neuromuscular disorder (HCC)    neuropathy improved since off depakote   PONV (postoperative nausea and vomiting)    Prostate disorder    enlarged   Shortness of breath    with over exertion    Family History  Problem Relation Age of Onset   Colon cancer Sister        diagnosed at age <4 several years later metastasizd   Colon cancer Maternal Grandfather     Past Surgical History:  Procedure Laterality Date   BONE ANCHORED HEARING AID  IMPLANT Left 11/15/2012   Procedure: LEFT BONE ANCHORED HEARING AID (BAHA) IMPLANT;  Surgeon: Izora Gala, MD;  Location: [facility];  Service: ENT;  Laterality: Left;   COLONOSCOPY WITH PROPOFOL N/A 07/20/2017   Dr. Gala Romney: Diverticulosis, 6 polyps removed, multiple tubular adenomas.  Surveillance exam advised in 3 years.   COLONOSCOPY WITH PROPOFOL N/A 10/12/2020   Procedure: COLONOSCOPY WITH PROPOFOL;  Surgeon: Daneil Dolin, MD;  Location: [facility];  Service: Endoscopy;  Laterality: N/A;  pm appt   CYSTECTOMY     larynx   CYSTOSCOPY WITH INSERTION OF UROLIFT N/A 01/15/2019   Procedure: CYSTOSCOPY WITH INSERTION OF UROLIFT;  Surgeon: Franchot Gallo, MD;  Location: [facility];  Service: Urology;  Laterality: N/A;   dental implant surgery  10/02/2020   ELBOW SURGERY Bilateral    x 2 on both   ESOPHAGOGASTRODUODENOSCOPY  2011   Dr. Gala Romney: Empiric dilation for history of dysphagia, hiatal hernia   EYE SURGERY     BIL  CATARACTS   INNER EAR SURGERY     x 2 on left   KNEE SURGERY Bilateral    x 2 both knees arthroscopic   MASTOIDECTOMY     left   NECK SURGERY     x 5 stiff at times limited neck rom   POLYPECTOMY  07/20/2017   Procedure: POLYPECTOMY;  Surgeon: Daneil Dolin, MD;  [facility]: [facility];  Service: Endoscopy;;  Ileo-cecal valve (HS) x 1; Hepatic flexure(HS) x1; Descending colon(HS) x2   POLYPECTOMY  10/12/2020   Procedure: POLYPECTOMY;  Surgeon: Daneil Dolin, MD;  [facility]: [facility];  Service: Endoscopy;;   ROTATOR CUFF REPAIR Right    WRIST SURGERY     left   Social History   Occupational History   Not on file  Tobacco Use   Smoking status: Every Day    Packs/day: 1.00    Years: 38.00    Total pack years: 38.00    Types: Cigarettes   Smokeless tobacco: Never  Vaping Use   Vaping Use: Never used  Substance and Sexual Activity   Alcohol use: No   Drug use: No   Sexual activity: Yes    Birth control/protection: None

## 2022-04-19 ENCOUNTER — Encounter: Payer: Self-pay | Admitting: Urology

## 2022-04-19 ENCOUNTER — Ambulatory Visit: Payer: Medicare Other | Admitting: Urology

## 2022-04-19 ENCOUNTER — Telehealth: Payer: Self-pay

## 2022-04-19 VITALS — BP 136/74 | HR 94

## 2022-04-19 DIAGNOSIS — N401 Enlarged prostate with lower urinary tract symptoms: Secondary | ICD-10-CM

## 2022-04-19 DIAGNOSIS — N400 Enlarged prostate without lower urinary tract symptoms: Secondary | ICD-10-CM | POA: Diagnosis not present

## 2022-04-19 NOTE — Telephone Encounter (Signed)
We can cut back to Linzess 127mg daily

## 2022-04-19 NOTE — Progress Notes (Signed)
History of Present Illness: Here for follow-up of BPH and prostate cancer screening.  Underwent UroLift on 8.11.2020.  Had significant lower urinary tract symptoms prior.  Since then, he has been voiding well.  No nocturia.  He is quite pleased with his voiding.  He is 65 and would like to continue with prostate cancer screening.  Apparently, there is a family history of prostate cancer.  Past Medical History:  Diagnosis Date   GERD (gastroesophageal reflux disease)    Headache(784.0)    MIGRAINE  --  LAST ONE ACOUPLE DAYS AGO   History of kidney stones yrs ago   Hypertension    Insomnia    Mild emphysema (HCC)    Neuromuscular disorder (HCC)    neuropathy improved since off depakote   PONV (postoperative nausea and vomiting)    Prostate disorder    enlarged   Shortness of breath    with over exertion    Past Surgical History:  Procedure Laterality Date   BONE ANCHORED HEARING AID IMPLANT Left 11/15/2012   Procedure: LEFT BONE ANCHORED HEARING AID (BAHA) IMPLANT;  Surgeon: Izora Gala, MD;  Location: [facility];  Service: ENT;  Laterality: Left;   COLONOSCOPY WITH PROPOFOL N/A 07/20/2017   Dr. Gala Romney: Diverticulosis, 6 polyps removed, multiple tubular adenomas.  Surveillance exam advised in 3 years.   COLONOSCOPY WITH PROPOFOL N/A 10/12/2020   Procedure: COLONOSCOPY WITH PROPOFOL;  Surgeon: Daneil Dolin, MD;  [facility];  Service: Endoscopy;  Laterality: N/A;  pm appt   CYSTECTOMY     larynx   CYSTOSCOPY WITH INSERTION OF UROLIFT N/A 01/15/2019   Procedure: CYSTOSCOPY WITH INSERTION OF UROLIFT;  Surgeon: Franchot Gallo, MD;  Location: [facility];  Service: Urology;  Laterality: N/A;   dental implant surgery  10/02/2020   ELBOW SURGERY Bilateral    x 2 on both   ESOPHAGOGASTRODUODENOSCOPY  2011   Dr. Gala Romney: Empiric dilation for history of dysphagia, hiatal hernia   EYE SURGERY     BIL  CATARACTS   INNER EAR SURGERY     x 2 on left   KNEE SURGERY Bilateral    x 2  both knees arthroscopic   MASTOIDECTOMY     left   NECK SURGERY     x 5 stiff at times limited neck rom   POLYPECTOMY  07/20/2017   Procedure: POLYPECTOMY;  Surgeon: Daneil Dolin, MD;  [facility];  Service: Endoscopy;;  Ileo-cecal valve (HS) x 1; Hepatic flexure(HS) x1; Descending colon(HS) x2   POLYPECTOMY  10/12/2020   Procedure: POLYPECTOMY;  Surgeon: Daneil Dolin, MD;  [facility];  Service: Endoscopy;;   ROTATOR CUFF REPAIR Right    WRIST SURGERY     left    Home Medications:  Allergies as of 04/19/2022   No Known Allergies      Medication List        Accurate as of April 19, 2022  3:02 PM. If you have any questions, ask your nurse or doctor.          albuterol (2.5 MG/3ML) 0.083% nebulizer solution Commonly known as: PROVENTIL Take 2.5 mg by nebulization daily as needed for wheezing or shortness of breath. Shortness of breathe   albuterol 108 (90 Base) MCG/ACT inhaler Commonly known as: VENTOLIN HFA Inhale 2 puffs into the lungs every 6 (six) hours as needed for wheezing or shortness of breath.   amitriptyline 100 MG tablet Commonly known as: ELAVIL Take  100 mg by mouth at bedtime.   Dry Eye Relief Drops 0.2-0.2-1 % Soln Generic drug: Glycerin-Hypromellose-PEG 400 Place 1 drop into both eyes daily as needed (Dry eyes).   gabapentin 300 MG capsule Commonly known as: NEURONTIN Take 300 mg by mouth at bedtime.   HYDROcodone-acetaminophen 10-325 MG tablet Commonly known as: NORCO Take 1 tablet by mouth every 6 (six) hours as needed for severe pain or moderate pain.   linaclotide 290 MCG Caps capsule Commonly known as: Linzess Take 1 capsule (290 mcg total) by mouth daily before breakfast.   nitroGLYCERIN 0.4 MG SL tablet Commonly known as: NITROSTAT Place 0.4 mg under the tongue every 5 (five) minutes as needed for chest pain.   ONE-A-DAY MENS 50+ PO Take 1 tablet by mouth daily.   pantoprazole 40 MG tablet Commonly  known as: PROTONIX Take 1 tablet (40 mg total) by mouth daily before breakfast.   pravastatin 40 MG tablet Commonly known as: PRAVACHOL Take 40 mg by mouth at bedtime.   QUEtiapine 50 MG tablet Commonly known as: SEROQUEL Take 50 mg by mouth at bedtime.   verapamil 180 MG CR tablet Commonly known as: CALAN-SR Take 180 mg by mouth at bedtime.   zolpidem 10 MG tablet Commonly known as: AMBIEN Take 10 mg by mouth at bedtime.        Allergies: No Known Allergies  Family History  Problem Relation Age of Onset   Colon cancer Sister        diagnosed at age <31 several years later metastasizd   Colon cancer Maternal Grandfather     Social History:  reports that he has been smoking cigarettes. He has a 38.00 pack-year smoking history. He has never used smokeless tobacco. He reports that he does not drink alcohol and does not use drugs.  ROS: A complete review of systems was performed.  All systems are negative except for pertinent findings as noted.  Physical Exam:  Vital signs in last 24 hours: BP 136/74   Pulse 94  Constitutional:  Alert and oriented, No acute distress Cardiovascular: Regular rate  Respiratory: Normal respiratory effort GI: No inguinal hernias Genitourinary: Normal male phallus, testes are descended bilaterally and non-tender and without masses, scrotum is normal in appearance without lesions or masses, perineum is normal on inspection.  Normal anal sphincter tone.  Prostate 30 g, symmetric, nonnodular, nontender. Lymphatic: No lymphadenopathy Neurologic: Grossly intact, no focal deficits Psychiatric: Normal mood and affect  I have reviewed prior pt notes  I have reviewed urinalysis results  I have independently reviewed prior imaging--CT images from 2015 reviewed  I have reviewed prior PSA results    Impression/Assessment:  1.  BPH with lower urinary tract symptoms-vastly improved with UroLift performed just over 3 years ago  2.  Prostate  cancer screening-normal PSA in the past, he would like one drawn today  Plan:  1.  His PSA is checked  2.  I will have him come back in 1 year at his request for routine check

## 2022-04-19 NOTE — Telephone Encounter (Signed)
Pt called stating that the Linzess 290 mcg is too strong that it is making him have diarrhea.

## 2022-04-20 ENCOUNTER — Encounter (HOSPITAL_COMMUNITY): Payer: Medicare Other

## 2022-04-20 LAB — URINALYSIS, ROUTINE W REFLEX MICROSCOPIC
Bilirubin, UA: NEGATIVE
Glucose, UA: NEGATIVE
Ketones, UA: NEGATIVE
Nitrite, UA: NEGATIVE
Protein,UA: NEGATIVE
Specific Gravity, UA: <1.005 — ABNORMAL LOW (ref 1.005–1.030)
Urobilinogen, Ur: 0.2 mg/dL (ref 0.2–1.0)
pH, UA: 6 (ref 5.0–7.5)

## 2022-04-20 LAB — MICROSCOPIC EXAMINATION
Bacteria, UA: NONE SEEN
Epithelial Cells (non renal): NONE SEEN /hpf (ref 0–10)
RBC, Urine: NONE SEEN /hpf (ref 0–2)

## 2022-04-20 LAB — PSA: Prostate Specific Ag, Serum: 0.9 ng/mL (ref 0.0–4.0)

## 2022-04-21 MED ORDER — LINACLOTIDE 145 MCG PO CAPS: 145.0000 ug | ORAL_CAPSULE | Freq: Every day | ORAL | 11 refills | Status: DC

## 2022-04-21 NOTE — Addendum Note (Signed)
Addended by: Mahala Menghini on: 04/21/2022 01:39 PM   Modules accepted: Orders

## 2022-04-21 NOTE — Telephone Encounter (Signed)
Pt would like the lower dose sent in to [facility]

## 2022-04-22 ENCOUNTER — Encounter (HOSPITAL_COMMUNITY): Payer: Medicare Other

## 2022-04-22 ENCOUNTER — Telehealth (HOSPITAL_COMMUNITY): Payer: Self-pay

## 2022-04-22 NOTE — Telephone Encounter (Signed)
No show, called and left message concerning missed apt today. Reminded next apt date and time with contact information included in message if needs to cancel/reschedule apts in the future.   Ihor Austin, LPTA/CLT; Delana Meyer [phone]

## 2022-04-22 NOTE — Telephone Encounter (Signed)
Pt called back and reports he cancelled apt for today's session yesterday. Pt with cold. Reminded next apt date and time with contact number included if needs to cancel/reschedule future apts.   Ihor Austin, LPTA/CLT; Delana Meyer [phone]

## 2022-04-26 ENCOUNTER — Ambulatory Visit (HOSPITAL_COMMUNITY): Payer: Medicare Other | Admitting: Physical Therapy

## 2022-04-26 DIAGNOSIS — M5459 Other low back pain: Secondary | ICD-10-CM

## 2022-04-26 DIAGNOSIS — M545 Low back pain, unspecified: Secondary | ICD-10-CM | POA: Diagnosis not present

## 2022-04-26 DIAGNOSIS — R29898 Other symptoms and signs involving the musculoskeletal system: Secondary | ICD-10-CM | POA: Diagnosis not present

## 2022-04-26 DIAGNOSIS — M6281 Muscle weakness (generalized): Secondary | ICD-10-CM | POA: Diagnosis not present

## 2022-04-26 DIAGNOSIS — G8929 Other chronic pain: Secondary | ICD-10-CM | POA: Diagnosis not present

## 2022-04-26 DIAGNOSIS — R2689 Other abnormalities of gait and mobility: Secondary | ICD-10-CM

## 2022-04-26 NOTE — Therapy (Signed)
OUTPATIENT PHYSICAL THERAPY THORACOLUMBAR EVALUATION   Patient Name: Terry Morrow MRN: [id number] DOB:28-Dec-1950, 71 y.o., male Today's Date: 04/26/2022   PT End of Session - 11/21/231517    Visit Number 3    Number of Visits 12    Date for PT Re-Evaluation 05/23/22    Authorization Type BCBS Medicare (no auth, no VL)    Progress Note Due on Visit 10    PT Start Time 9371    PT Stop Time 1517    PT Time Calculation (min) 40 min    Activity Tolerance Patient tolerated treatment well    Behavior During Therapy WFL for tasks assessed/performed              Past Medical History:  Diagnosis Date   GERD (gastroesophageal reflux disease)    Headache(784.0)    MIGRAINE  --  LAST ONE ACOUPLE DAYS AGO   History of kidney stones yrs ago   Hypertension    Insomnia    Mild emphysema (HCC)    Neuromuscular disorder (HCC)    neuropathy improved since off depakote   PONV (postoperative nausea and vomiting)    Prostate disorder    enlarged   Shortness of breath    with over exertion   Past Surgical History:  Procedure Laterality Date   BONE ANCHORED HEARING AID IMPLANT Left 11/15/2012   Procedure: LEFT BONE ANCHORED HEARING AID (BAHA) IMPLANT;  Surgeon: Izora Gala, MD;  Location: [facility];  Service: ENT;  Laterality: Left;   COLONOSCOPY WITH PROPOFOL N/A 07/20/2017   Dr. Gala Romney: Diverticulosis, 6 polyps removed, multiple tubular adenomas.  Surveillance exam advised in 3 years.   COLONOSCOPY WITH PROPOFOL N/A 10/12/2020   Procedure: COLONOSCOPY WITH PROPOFOL;  Surgeon: Daneil Dolin, MD;  [facility];  Service: Endoscopy;  Laterality: N/A;  pm appt   CYSTECTOMY     larynx   CYSTOSCOPY WITH INSERTION OF UROLIFT N/A 01/15/2019   Procedure: CYSTOSCOPY WITH INSERTION OF UROLIFT;  Surgeon: Franchot Gallo, MD;  Location: [facility];  Service: Urology;  Laterality: N/A;   dental implant surgery  10/02/2020   ELBOW SURGERY Bilateral    x 2 on both    ESOPHAGOGASTRODUODENOSCOPY  2011   Dr. Gala Romney: Empiric dilation for history of dysphagia, hiatal hernia   EYE SURGERY     BIL  CATARACTS   INNER EAR SURGERY     x 2 on left   KNEE SURGERY Bilateral    x 2 both knees arthroscopic   MASTOIDECTOMY     left   NECK SURGERY     x 5 stiff at times limited neck rom   POLYPECTOMY  07/20/2017   Procedure: POLYPECTOMY;  Surgeon: Daneil Dolin, MD;  [facility];  Service: Endoscopy;;  Ileo-cecal valve (HS) x 1; Hepatic flexure(HS) x1; Descending colon(HS) x2   POLYPECTOMY  10/12/2020   Procedure: POLYPECTOMY;  Surgeon: Daneil Dolin, MD;  [facility];  Service: Endoscopy;;   ROTATOR CUFF REPAIR Right    WRIST SURGERY     left   Patient Active Problem List   Diagnosis Date Noted   Low back pain 04/14/2022   H/O adenomatous polyp of colon 08/24/2020   Constipation 08/24/2020   FH: colon cancer in first degree relative <18 years old 08/24/2020   Encounter for screening colonoscopy 06/23/2017   H/O failed conscious sedation 06/23/2017   GERD 01/05/2010   OTHER DYSPHAGIA 01/05/2010    PCP: Sharilyn Sites  REFERRING PROVIDER: Marybelle Killings, MD  REFERRING DIAG: [phone] (ICD-10-CM) - Chronic bilateral low back pain, unspecified whether sciatica presen  Rationale for Evaluation and Treatment: Rehabilitation  THERAPY DIAG:  Other low back pain  Muscle weakness (generalized)  Other abnormalities of gait and mobility  ONSET DATE: several months  SUBJECTIVE:                                                                                                                                                                                           SUBJECTIVE STATEMENT: Pt states that his back is about a 3 now, over the weekend it was terrible.  We are remodeling our kitchen.Marland Kitchen  PERTINENT HISTORY:  Hx anterior cervical fusions and foraminotomies C7-T1  PAIN:  Are you having pain? Yes: NPRS scale: 3/10 Pain  location: low back/L buttock Pain description: aching Aggravating factors: sitting, yard work Relieving factors: kneeling, ice, heat  PRECAUTIONS: None  WEIGHT BEARING RESTRICTIONS: No  FALLS:  Has patient fallen in last 6 months? No  LIVING ENVIRONMENT: Lives with: lives with their spouse Lives in: House/apartment Stairs: Yes: External: 3 steps; on right going up and on left going up Has following equipment at home: None  OCCUPATION: Retired  PLOF: Independent  PATIENT GOALS: decrease pain  NEXT MD VISIT: 04/14/22  OBJECTIVE:   DIAGNOSTIC FINDINGS:  XR Lumbar Spine 2-3 Views  Result Date: 03/09/2022 [facility] lateral lumbar images are obtained and reviewed.  Patient has aortic calcification consistent with PAD.  There is right thoracolumbar curve T12-L4 measuring 20 degrees.  No spondylolisthesis.  Mild degenerative facet changes. Impression: Lumbar curvature as described above.  No acute bone changes noted  PATIENT SURVEYS:  FOTO 53 % function  SCREENING FOR RED FLAGS: Bowel or bladder incontinence: No Spinal tumors: No Cauda equina syndrome: No Compression fracture: No Abdominal aneurysm: No  COGNITION: Overall cognitive status: Within functional limits for tasks assessed     SENSATION: WFL  Observation:  frequent weight shift off L with sitting/standing  POSTURE: decreased lumbar lordosis and decreased thoracic kyphosis  PALPATION: Minimal TTP L glute max/piriformis, hypomobile thoracic and lumbar spine CPA  LUMBAR ROM:   AROM eval  Flexion 25% limited (pain in low back, buttock)  Extension 25% limited  Right lateral flexion 0% limited  Left lateral flexion 25% limited  Right rotation 0% limited  Left rotation 0% limited   (Blank rows = not tested) *=pain  LOWER EXTREMITY ROM:   WFL for tasks assessed  Active  Right eval Left eval  Hip flexion    Hip extension    Hip abduction    Hip adduction  Hip internal rotation    Hip external rotation     Knee flexion    Knee extension    Ankle dorsiflexion    Ankle plantarflexion    Ankle inversion    Ankle eversion     (Blank rows = not tested)  LOWER EXTREMITY MMT:    MMT Right eval Left eval  Hip flexion 5 5  Hip extension 5 4  Hip abduction 5 4+  Hip adduction    Hip internal rotation    Hip external rotation    Knee flexion 5 5  Knee extension 5 5  Ankle dorsiflexion 5 5  Ankle plantarflexion    Ankle inversion    Ankle eversion     (Blank rows = not tested)  LUMBAR SPECIAL TESTS:  Repeated lumbar extension in standing 2 x 10 no change in symptoms  FUNCTIONAL TESTS:  5 times sit to stand: 9.72 without UE use 2 minute walk test: 375  GAIT: Distance walked: 375 feet Assistive device utilized: None Level of assistance: Complete Independence Comments: 2MWT; L knee flexed in stance with slight increasing antalgic, no increase in back or radicular symptoms  TODAY'S TREATMENT:             04/26/22: Education on proper body mechanics for bed mobility Supine: Knee to chest 3 x 30"  Active hamstring stretch 3 x 30 " Trunk rotation x 10 Bridge with 5" hold and abdominal contraction x 15 Sidelying : clam Prone: Prone on elbow  Press up x 5 increased pain Hip extension x 10 B alternating  Sitting : Sit to stand                                                                                                         DATE:  04/13/22: Reviewed goals, educated importance of HEP compliance, pt able to recall and demonstrate appropriate mechanics Supine: Bridge 2x 10 5" holds Educated importance of breathing through session Diaphragmatic breathing x 1' Abdominal sets paired with exhalation holding 3" Dead bug with ab set 10x LTR 5x 10" holds Prone: POE x 2'  Heel squeeze 10x 5" Standing 3D hip excursion 10x (weight shift, rotation, squat front of bed, extension   04/11/22 Bridge 2x 10 Supine ab set x 10 with 5 second holds    PATIENT EDUCATION:  Education  details: Patient educated on exam findings, POC, scope of PT, HEP Person educated: Patient Education method: Consulting civil engineer, Media planner, and Handouts Education comprehension: verbalized understanding, returned demonstration, verbal cues required, and tactile cues required  HOME EXERCISE PROGRAM:  04/26/22: - Supine Single Knee to Chest Stretch  - 1 x daily - 7 x weekly - 1 sets - 3 reps - 30" hold - Supine Hamstring Stretch  - 1 x daily - 7 x weekly - 1 sets - 3 reps - 30" hold - Supine Lower Trunk Rotation  - 1 x daily - 7 x weekly - 1 sets - 10 reps - 3" hold - Prone Press Up  - 1 x daily - 7 x weekly - 3  sets - 10 reps Access Code: [email or web address]  Date: 04/11/2022 - Supine Bridge  - 2-3 x daily - 7 x weekly - 2 sets - 10 reps - Abdominal Bracing  - 2-3 x daily - 7 x weekly - 2 sets - 10 reps - 5 second hold  05/02/2022: Dead bug and POE  ASSESSMENT:  CLINICAL IMPRESSION:  Session focus on  lumbar mobility with good mechanics  as well as stability.  Updated HEP for increased lumbar mobility with addition printout given.  Pt will continue to benefit from skilled PT for education on body mechanics and lumbar stability.    OBJECTIVE IMPAIRMENTS: decreased activity tolerance, decreased endurance, decreased mobility, difficulty walking, decreased ROM, decreased strength, increased muscle spasms, impaired flexibility, improper body mechanics, postural dysfunction, and pain.   ACTIVITY LIMITATIONS: carrying, lifting, bending, standing, squatting, transfers, reach over head, locomotion level, and caring for others  PARTICIPATION LIMITATIONS: meal prep, cleaning, laundry, community activity, and yard work  PERSONAL FACTORS: Age, Time since onset of injury/illness/exacerbation, and 1 comorbidity: chronic LBP  are also affecting patient's functional outcome.   REHAB POTENTIAL: Good  CLINICAL DECISION MAKING: Stable/uncomplicated  EVALUATION COMPLEXITY: Low   GOALS: Goals reviewed with  patient? Yes  SHORT TERM GOALS: Target date: 05/02/2022  Patient will be independent with HEP in order to improve functional outcomes. Baseline:  Goal status: IN PROGRESS  2.  Patient will report at least 25% improvement in symptoms for improved quality of life. Baseline:  Goal status: IN PROGRESS    LONG TERM GOALS: Target date: 05/23/2022  Patient will report at least 75% improvement in symptoms for improved quality of life. Baseline:  Goal status: IN PROGRESS  2.  Patient will improve FOTO score by at least 12 points in order to indicate improved tolerance to activity. Baseline: 53% function Goal status: IN PROGRESS  3.  Patient will demonstrate at least 25% improvement in lumbar ROM in all restricted planes for improved ability to move trunk while completing chores. Baseline: see above Goal status: IN PROGRESS  4.  Patient will report improved ability to complete yard work  in order to take care of his home. Baseline: limited Goal status: IN PROGRESS  5.  Patient will demonstrate grade of 5/5 MMT grade in all tested musculature as evidence of improved strength to assist with stair ambulation and gait.   Baseline: see above Goal status: IN PROGRESS    PLAN:  PT FREQUENCY: 2x/week  PT DURATION: 6 weeks  PLANNED INTERVENTIONS: Therapeutic exercises, Therapeutic activity, Neuromuscular re-education, Balance training, Gait training, Patient/Family education, Joint manipulation, Joint mobilization, Stair training, Orthotic/Fit training, DME instructions, Aquatic Therapy, Dry Needling, Electrical stimulation, Spinal manipulation, Spinal mobilization, Cryotherapy, Moist heat, Compression bandaging, scar mobilization, Splintting, Taping, Traction, Ultrasound, Ionotophoresis '4mg'$ /ml Dexamethasone, and Manual therapy   PLAN FOR NEXT SESSION: Core and glute strength, spinal mobility, postural strengthening  Rayetta Humphrey, PT CLT [phone]  1520

## 2022-05-03 ENCOUNTER — Encounter (HOSPITAL_COMMUNITY): Payer: Medicare Other | Admitting: Physical Therapy

## 2022-05-05 ENCOUNTER — Encounter (HOSPITAL_COMMUNITY): Payer: Medicare Other | Admitting: Physical Therapy

## 2022-05-09 DIAGNOSIS — D2339 Other benign neoplasm of skin of other parts of face: Secondary | ICD-10-CM | POA: Diagnosis not present

## 2022-05-09 DIAGNOSIS — L821 Other seborrheic keratosis: Secondary | ICD-10-CM | POA: Diagnosis not present

## 2022-05-09 DIAGNOSIS — D225 Melanocytic nevi of trunk: Secondary | ICD-10-CM | POA: Diagnosis not present

## 2022-05-09 DIAGNOSIS — D210 Benign neoplasm of connective and other soft tissue of head, face and neck: Secondary | ICD-10-CM | POA: Diagnosis not present

## 2022-05-09 DIAGNOSIS — D224 Melanocytic nevi of scalp and neck: Secondary | ICD-10-CM | POA: Diagnosis not present

## 2022-05-10 ENCOUNTER — Encounter (HOSPITAL_COMMUNITY): Payer: Medicare Other | Admitting: Physical Therapy

## 2022-05-12 ENCOUNTER — Encounter (HOSPITAL_COMMUNITY): Payer: Medicare Other

## 2022-05-17 ENCOUNTER — Encounter (HOSPITAL_COMMUNITY): Payer: Medicare Other | Admitting: Physical Therapy

## 2022-05-19 ENCOUNTER — Encounter (HOSPITAL_COMMUNITY): Payer: Medicare Other

## 2022-05-23 ENCOUNTER — Encounter (HOSPITAL_COMMUNITY): Payer: Medicare Other | Admitting: Physical Therapy

## 2022-05-25 ENCOUNTER — Encounter (HOSPITAL_COMMUNITY): Payer: Medicare Other | Admitting: Physical Therapy

## 2022-06-01 ENCOUNTER — Encounter (HOSPITAL_COMMUNITY): Payer: Medicare Other | Admitting: Physical Therapy

## 2022-06-24 ENCOUNTER — Other Ambulatory Visit: Payer: Self-pay | Admitting: Orthopaedic Surgery

## 2022-07-01 DIAGNOSIS — J449 Chronic obstructive pulmonary disease, unspecified: Secondary | ICD-10-CM | POA: Diagnosis not present

## 2022-10-04 DIAGNOSIS — Z0001 Encounter for general adult medical examination with abnormal findings: Secondary | ICD-10-CM | POA: Diagnosis not present

## 2022-10-04 DIAGNOSIS — Z1331 Encounter for screening for depression: Secondary | ICD-10-CM | POA: Diagnosis not present

## 2022-10-04 DIAGNOSIS — G9519 Other vascular myelopathies: Secondary | ICD-10-CM | POA: Diagnosis not present

## 2022-10-04 DIAGNOSIS — Z125 Encounter for screening for malignant neoplasm of prostate: Secondary | ICD-10-CM | POA: Diagnosis not present

## 2022-10-04 DIAGNOSIS — M4800 Spinal stenosis, site unspecified: Secondary | ICD-10-CM | POA: Diagnosis not present

## 2022-10-04 DIAGNOSIS — G43009 Migraine without aura, not intractable, without status migrainosus: Secondary | ICD-10-CM | POA: Diagnosis not present

## 2022-10-04 DIAGNOSIS — M19032 Primary osteoarthritis, left wrist: Secondary | ICD-10-CM | POA: Diagnosis not present

## 2022-10-04 DIAGNOSIS — I10 Essential (primary) hypertension: Secondary | ICD-10-CM | POA: Diagnosis not present

## 2022-10-04 DIAGNOSIS — Z6824 Body mass index (BMI) 24.0-24.9, adult: Secondary | ICD-10-CM | POA: Diagnosis not present

## 2022-11-16 ENCOUNTER — Other Ambulatory Visit: Payer: Self-pay

## 2022-11-16 MED ORDER — PANTOPRAZOLE SODIUM 40 MG PO TBEC: 40.0000 mg | DELAYED_RELEASE_TABLET | Freq: Every day | ORAL | 0 refills | Status: DC

## 2022-11-29 DIAGNOSIS — E559 Vitamin D deficiency, unspecified: Secondary | ICD-10-CM | POA: Diagnosis not present

## 2022-11-29 DIAGNOSIS — M19032 Primary osteoarthritis, left wrist: Secondary | ICD-10-CM | POA: Diagnosis not present

## 2022-11-29 DIAGNOSIS — G9332 Myalgic encephalomyelitis/chronic fatigue syndrome: Secondary | ICD-10-CM | POA: Diagnosis not present

## 2022-11-29 DIAGNOSIS — G9519 Other vascular myelopathies: Secondary | ICD-10-CM | POA: Diagnosis not present

## 2022-11-29 DIAGNOSIS — Z0001 Encounter for general adult medical examination with abnormal findings: Secondary | ICD-10-CM | POA: Diagnosis not present

## 2022-11-29 DIAGNOSIS — D518 Other vitamin B12 deficiency anemias: Secondary | ICD-10-CM | POA: Diagnosis not present

## 2022-11-29 DIAGNOSIS — Z6823 Body mass index (BMI) 23.0-23.9, adult: Secondary | ICD-10-CM | POA: Diagnosis not present

## 2022-11-29 DIAGNOSIS — J0100 Acute maxillary sinusitis, unspecified: Secondary | ICD-10-CM | POA: Diagnosis not present

## 2022-11-29 DIAGNOSIS — I10 Essential (primary) hypertension: Secondary | ICD-10-CM | POA: Diagnosis not present

## 2022-11-29 DIAGNOSIS — M4800 Spinal stenosis, site unspecified: Secondary | ICD-10-CM | POA: Diagnosis not present

## 2022-11-29 DIAGNOSIS — Z125 Encounter for screening for malignant neoplasm of prostate: Secondary | ICD-10-CM | POA: Diagnosis not present

## 2022-12-15 DIAGNOSIS — H43811 Vitreous degeneration, right eye: Secondary | ICD-10-CM | POA: Diagnosis not present

## 2023-01-10 DIAGNOSIS — H59031 Cystoid macular edema following cataract surgery, right eye: Secondary | ICD-10-CM | POA: Diagnosis not present

## 2023-01-12 ENCOUNTER — Encounter (INDEPENDENT_AMBULATORY_CARE_PROVIDER_SITE_OTHER): Payer: Self-pay | Admitting: Ophthalmology

## 2023-01-17 ENCOUNTER — Encounter (INDEPENDENT_AMBULATORY_CARE_PROVIDER_SITE_OTHER): Payer: Medicare Other | Admitting: Ophthalmology

## 2023-01-17 DIAGNOSIS — H59031 Cystoid macular edema following cataract surgery, right eye: Secondary | ICD-10-CM | POA: Diagnosis not present

## 2023-01-17 DIAGNOSIS — I10 Essential (primary) hypertension: Secondary | ICD-10-CM

## 2023-01-17 DIAGNOSIS — H35033 Hypertensive retinopathy, bilateral: Secondary | ICD-10-CM

## 2023-01-17 DIAGNOSIS — H43813 Vitreous degeneration, bilateral: Secondary | ICD-10-CM

## 2023-01-24 DIAGNOSIS — Z6824 Body mass index (BMI) 24.0-24.9, adult: Secondary | ICD-10-CM | POA: Diagnosis not present

## 2023-01-24 DIAGNOSIS — J0100 Acute maxillary sinusitis, unspecified: Secondary | ICD-10-CM | POA: Diagnosis not present

## 2023-01-24 DIAGNOSIS — M19032 Primary osteoarthritis, left wrist: Secondary | ICD-10-CM | POA: Diagnosis not present

## 2023-01-24 DIAGNOSIS — J209 Acute bronchitis, unspecified: Secondary | ICD-10-CM | POA: Diagnosis not present

## 2023-01-24 DIAGNOSIS — J441 Chronic obstructive pulmonary disease with (acute) exacerbation: Secondary | ICD-10-CM | POA: Diagnosis not present

## 2023-01-30 DIAGNOSIS — I10 Essential (primary) hypertension: Secondary | ICD-10-CM | POA: Diagnosis not present

## 2023-03-31 ENCOUNTER — Encounter: Payer: Self-pay | Admitting: Gastroenterology

## 2023-03-31 ENCOUNTER — Ambulatory Visit (INDEPENDENT_AMBULATORY_CARE_PROVIDER_SITE_OTHER): Payer: Medicare Other | Admitting: Gastroenterology

## 2023-03-31 VITALS — BP 127/65 | HR 99 | Temp 97.9°F | Ht 71.0 in | Wt 177.2 lb

## 2023-03-31 DIAGNOSIS — Z800 Family history of malignant neoplasm of digestive organs: Secondary | ICD-10-CM | POA: Diagnosis not present

## 2023-03-31 DIAGNOSIS — Z860101 Personal history of adenomatous and serrated colon polyps: Secondary | ICD-10-CM | POA: Diagnosis not present

## 2023-03-31 DIAGNOSIS — K5900 Constipation, unspecified: Secondary | ICD-10-CM | POA: Diagnosis not present

## 2023-03-31 DIAGNOSIS — K219 Gastro-esophageal reflux disease without esophagitis: Secondary | ICD-10-CM

## 2023-03-31 MED ORDER — PANTOPRAZOLE SODIUM 40 MG PO TBEC: 40.0000 mg | DELAYED_RELEASE_TABLET | Freq: Every day | ORAL | 3 refills | Status: DC

## 2023-03-31 MED ORDER — TRULANCE 3 MG PO TABS: 3.0000 mg | ORAL_TABLET | Freq: Every day | ORAL | 3 refills | Status: DC

## 2023-03-31 NOTE — Progress Notes (Signed)
GI Office Note    Referring Provider: Assunta Found, MD Primary Care Physician:  Assunta Found, MD  Primary Gastroenterologist: Roetta Sessions, MD   Chief Complaint   Chief Complaint  Patient presents with   Follow-up    Thinks he may be due for colonoscopy    History of Present Illness   Terry Morrow is a 72 y.o. male presenting today for follow-up.  Last seen in October 2023.  History of constipation, GERD, colon polyps, family history of colon cancer (sister less than age 43).  BMS not very regular. Taking Linzess daily. BM 2 days per week. Good BM those days. Tried Linzess 290mg  but too much diarrhea. Amitiza was not effective. No melnea, brbpr. No abdominal pain. Ran out of pantoprazole two weeks ago, has had terrible reflux. Currently on OTC omeprazole for couple of weeks but not effective. Had been doing well on pantoprazole.   Colonoscopy May 2022: - Nine 4 to 7 mm polyps in the descending colon and in the ascending colon, removed with a cold snare. Resected and retrieved. - The examination was otherwise normal on direct and retroflexion views. -Multiple tubular adenomas on path -Surveillance colonoscopy recommended in 3 years   EGD 2011 -Empiric dilation for history of dysphagia -Hiatal hernia  Medications   Current Outpatient Medications  Medication Sig Dispense Refill   albuterol (PROVENTIL) (2.5 MG/3ML) 0.083% nebulizer solution Take 2.5 mg by nebulization daily as needed for wheezing or shortness of breath. Shortness of breathe     albuterol (VENTOLIN HFA) 108 (90 Base) MCG/ACT inhaler Inhale 2 puffs into the lungs every 6 (six) hours as needed for wheezing or shortness of breath.     amitriptyline (ELAVIL) 100 MG tablet Take 100 mg by mouth at bedtime.     gabapentin (NEURONTIN) 300 MG capsule Take 300 mg by mouth at bedtime.     Glycerin-Hypromellose-PEG 400 (DRY EYE RELIEF DROPS) 0.2-0.2-1 % SOLN Place 1 drop into both eyes daily as needed (Dry  eyes).     HYDROcodone-acetaminophen (NORCO) 10-325 MG tablet Take 1 tablet by mouth every 6 (six) hours as needed for severe pain or moderate pain.     ketorolac (ACULAR) 0.5 % ophthalmic solution Place 1 drop into the right eye 4 (four) times daily.     linaclotide (LINZESS) 145 MCG CAPS capsule Take 1 capsule (145 mcg total) by mouth daily before breakfast. 30 capsule 11   Multiple Vitamins-Minerals (ONE-A-DAY MENS 50+ PO) Take 1 tablet by mouth daily.     nitroGLYCERIN (NITROSTAT) 0.4 MG SL tablet Place 0.4 mg under the tongue every 5 (five) minutes as needed for chest pain.      pantoprazole (PROTONIX) 40 MG tablet Take 1 tablet (40 mg total) by mouth daily before breakfast. 90 tablet 0   pravastatin (PRAVACHOL) 40 MG tablet Take 40 mg by mouth at bedtime.     prednisoLONE acetate (PRED FORTE) 1 % ophthalmic suspension Place 1 drop into the right eye 4 (four) times daily.     QUEtiapine (SEROQUEL) 50 MG tablet Take 50 mg by mouth at bedtime.     verapamil (CALAN-SR) 180 MG CR tablet Take 180 mg by mouth at bedtime.     zolpidem (AMBIEN) 10 MG tablet Take 10 mg by mouth at bedtime.     No current facility-administered medications for this visit.    Allergies   Allergies as of 03/31/2023   (No Known Allergies)       Review  of Systems   General: Negative for anorexia, weight loss, fever, chills, fatigue, weakness. ENT: Negative for hoarseness, difficulty swallowing , nasal congestion. CV: Negative for chest pain, angina, palpitations, dyspnea on exertion, peripheral edema.  Respiratory: Negative for dyspnea at rest, dyspnea on exertion, cough, sputum, wheezing.  GI: See history of present illness. GU:  Negative for dysuria, hematuria, urinary incontinence, urinary frequency, nocturnal urination.  Endo: Negative for unusual weight change.     Physical Exam   BP 127/65 (BP Location: Right Arm, Patient Position: Sitting, Cuff Size: Normal)   Pulse 99   Temp 97.9 F (36.6 C)  (Oral)   Ht 5\' 11"  (1.803 m)   Wt 177 lb 3.2 oz (80.4 kg)   SpO2 96%   BMI 24.71 kg/m    General: Well-nourished, well-developed in no acute distress.  Eyes: No icterus. Mouth: Oropharyngeal mucosa moist and pink  Abdomen: Bowel sounds are normal, nontender, nondistended, no hepatosplenomegaly or masses,  no abdominal bruits or hernia , no rebound or guarding.  Rectal: not performed  Extremities: No lower extremity edema. No clubbing or deformities. Neuro: Alert and oriented x 4   Skin: Warm and dry, no jaundice.   Psych: Alert and cooperative, normal mood and affect.  Labs   None available  Imaging Studies   No results found.  Assessment/Plan:   GERD -flare off pantoprazole, did not respond to OTC omeprazole -restart pantoprazole 40mg  daily -reinforced antireflux measures -call if symptoms don't settle down  Constipation -inadequate management on Linzess daily. Did not tolerate daily. Failed amitiza.  -try Trulance 3mg  daily.  -call if ongoing issues.  H/o adenomatous colon polyps/FH CRC -due colonoscopy 10/2023 -call if any new symptoms in interim     Leanna Battles. Melvyn Neth, MHS, PA-C [facility]

## 2023-03-31 NOTE — Patient Instructions (Signed)
Stop Linzess. Start Trulance 3mg  daily. RX sent to pharmacy. Samples provided. RX for pantoprazole 40mg  daily before breakfast sent to your pharmacy. Please call if your bowels do not improve and your reflux does not settle down! You will be due next colonoscopy 10/2023. We will send you reminder!

## 2023-04-10 ENCOUNTER — Ambulatory Visit (HOSPITAL_COMMUNITY)
Admission: RE | Admit: 2023-04-10 | Discharge: 2023-04-10 | Disposition: A | Discharge status: Home / Self Care | Payer: Medicare Other | Source: Ambulatory Visit | Attending: Family Medicine | Admitting: Family Medicine

## 2023-04-10 ENCOUNTER — Other Ambulatory Visit (HOSPITAL_COMMUNITY): Payer: Self-pay | Admitting: Family Medicine

## 2023-04-10 DIAGNOSIS — Z6824 Body mass index (BMI) 24.0-24.9, adult: Secondary | ICD-10-CM | POA: Diagnosis not present

## 2023-04-10 DIAGNOSIS — J209 Acute bronchitis, unspecified: Secondary | ICD-10-CM | POA: Diagnosis not present

## 2023-04-10 DIAGNOSIS — J441 Chronic obstructive pulmonary disease with (acute) exacerbation: Secondary | ICD-10-CM

## 2023-04-10 DIAGNOSIS — I700 Atherosclerosis of aorta: Secondary | ICD-10-CM | POA: Diagnosis not present

## 2023-04-14 ENCOUNTER — Other Ambulatory Visit: Payer: Self-pay | Admitting: Medical Genetics

## 2023-04-14 DIAGNOSIS — Z006 Encounter for examination for normal comparison and control in clinical research program: Secondary | ICD-10-CM

## 2023-04-24 NOTE — Progress Notes (Signed)
History of Present Illness: Here for follow-up of BPH and prostate cancer screening.  Underwent UroLift on 8.11.2020.  Had significant lower urinary tract symptoms prior.  Since then, he has been voiding well.  No nocturia.  He is quite pleased with his voiding.  He is 71 and would like to continue with prostate cancer screening.  Apparently, there is a family history of prostate cancer.  Past Medical History:  Diagnosis Date   GERD (gastroesophageal reflux disease)    Headache(784.0)    MIGRAINE  --  LAST ONE ACOUPLE DAYS AGO   History of kidney stones yrs ago   Hypertension    Insomnia    Mild emphysema (HCC)    Neuromuscular disorder (HCC)    neuropathy improved since off depakote   PONV (postoperative nausea and vomiting)    Prostate disorder    enlarged   Shortness of breath    with over exertion    Past Surgical History:  Procedure Laterality Date   BONE ANCHORED HEARING AID IMPLANT Left 11/15/2012   Procedure: LEFT BONE ANCHORED HEARING AID (BAHA) IMPLANT;  Surgeon: Serena Colonel, MD;  Location: [facility] OR;  Service: ENT;  Laterality: Left;   COLONOSCOPY WITH PROPOFOL N/A 07/20/2017   Dr. Jena Gauss: Diverticulosis, 6 polyps removed, multiple tubular adenomas.  Surveillance exam advised in 3 years.   COLONOSCOPY WITH PROPOFOL N/A 10/12/2020   Procedure: COLONOSCOPY WITH PROPOFOL;  Surgeon: Corbin Ade, MD;  Location: [facility];  Service: Endoscopy;  Laterality: N/A;  pm appt   CYSTECTOMY     larynx   CYSTOSCOPY WITH INSERTION OF UROLIFT N/A 01/15/2019   Procedure: CYSTOSCOPY WITH INSERTION OF UROLIFT;  Surgeon: Marcine Matar, MD;  Location: [facility] ORS;  Service: Urology;  Laterality: N/A;   dental implant surgery  10/02/2020   ELBOW SURGERY Bilateral    x 2 on both   ESOPHAGOGASTRODUODENOSCOPY  2011   Dr. Jena Gauss: Empiric dilation for history of dysphagia, hiatal hernia   EYE SURGERY     BIL  CATARACTS   INNER EAR SURGERY     x 2 on left   KNEE SURGERY Bilateral    x 2  both knees arthroscopic   MASTOIDECTOMY     left   NECK SURGERY     x 5 stiff at times limited neck rom   POLYPECTOMY  07/20/2017   Procedure: POLYPECTOMY;  Surgeon: Corbin Ade, MD;  Location: [facility];  Service: Endoscopy;;  Ileo-cecal valve (HS) x 1; Hepatic flexure(HS) x1; Descending colon(HS) x2   POLYPECTOMY  10/12/2020   Procedure: POLYPECTOMY;  Surgeon: Corbin Ade, MD;  Location: [facility];  Service: Endoscopy;;   ROTATOR CUFF REPAIR Right    WRIST SURGERY     left    Home Medications:  Allergies as of 04/25/2023   No Known Allergies      Medication List        Accurate as of April 24, 2023  6:47 PM. If you have any questions, ask your nurse or doctor.          albuterol (2.5 MG/3ML) 0.083% nebulizer solution Commonly known as: PROVENTIL Take 2.5 mg by nebulization daily as needed for wheezing or shortness of breath. Shortness of breathe   albuterol 108 (90 Base) MCG/ACT inhaler Commonly known as: VENTOLIN HFA Inhale 2 puffs into the lungs every 6 (six) hours as needed for wheezing or shortness of breath.   amitriptyline 100 MG tablet Commonly known as: ELAVIL Take  100 mg by mouth at bedtime.   Dry Eye Relief Drops 0.2-0.2-1 % Soln Generic drug: Glycerin-Hypromellose-PEG 400 Place 1 drop into both eyes daily as needed (Dry eyes).   gabapentin 300 MG capsule Commonly known as: NEURONTIN Take 300 mg by mouth at bedtime.   HYDROcodone-acetaminophen 10-325 MG tablet Commonly known as: NORCO Take 1 tablet by mouth every 6 (six) hours as needed for severe pain or moderate pain.   ketorolac 0.5 % ophthalmic solution Commonly known as: ACULAR Place 1 drop into the right eye 4 (four) times daily.   nitroGLYCERIN 0.4 MG SL tablet Commonly known as: NITROSTAT Place 0.4 mg under the tongue every 5 (five) minutes as needed for chest pain.   ONE-A-DAY MENS 50+ PO Take 1 tablet by mouth daily.   pantoprazole 40 MG tablet Commonly known as:  PROTONIX Take 1 tablet (40 mg total) by mouth daily before breakfast.   pravastatin 40 MG tablet Commonly known as: PRAVACHOL Take 40 mg by mouth at bedtime.   prednisoLONE acetate 1 % ophthalmic suspension Commonly known as: PRED FORTE Place 1 drop into the right eye 4 (four) times daily.   QUEtiapine 50 MG tablet Commonly known as: SEROQUEL Take 50 mg by mouth at bedtime.   Trulance 3 MG Tabs Generic drug: Plecanatide Take 1 tablet (3 mg total) by mouth daily.   verapamil 180 MG CR tablet Commonly known as: CALAN-SR Take 180 mg by mouth at bedtime.   zolpidem 10 MG tablet Commonly known as: AMBIEN Take 10 mg by mouth at bedtime.        Allergies: No Known Allergies  Family History  Problem Relation Age of Onset   Colon cancer Sister        diagnosed at age <90 several years later metastasizd   Colon cancer Maternal Grandfather     Social History:  reports that he has been smoking cigarettes. He has a 38 pack-year smoking history. He has never used smokeless tobacco. He reports that he does not drink alcohol and does not use drugs.  ROS: A complete review of systems was performed.  All systems are negative except for pertinent findings as noted.  Physical Exam:  Vital signs in last 24 hours: There were no vitals taken for this visit. Constitutional:  Alert and oriented, No acute distress Cardiovascular: Regular rate  Respiratory: Normal respiratory effort GI: No inguinal hernias Genitourinary: Normal male phallus, testes are descended bilaterally and non-tender and without masses, scrotum is normal in appearance without lesions or masses, perineum is normal on inspection.  Normal anal sphincter tone.  Prostate 30 g, symmetric, nonnodular, nontender. Lymphatic: No lymphadenopathy Neurologic: Grossly intact, no focal deficits Psychiatric: Normal mood and affect  I have reviewed prior pt notes  I have reviewed urinalysis results  I have independently  reviewed prior imaging--CT images from 2015 reviewed  I have reviewed prior PSA results    Impression/Assessment:  1.  BPH with lower urinary tract symptoms-vastly improved with UroLift performed just over 3 years ago  2.  Prostate cancer screening-normal PSA in the past, he would like one drawn today  Plan:  1.  His PSA is checked  2.  I will have him come back in 1 year at his request for routine check

## 2023-04-25 ENCOUNTER — Encounter: Payer: Self-pay | Admitting: Urology

## 2023-04-25 ENCOUNTER — Ambulatory Visit: Payer: Medicare Other | Admitting: Urology

## 2023-04-25 VITALS — BP 113/53 | HR 114

## 2023-04-25 DIAGNOSIS — N400 Enlarged prostate without lower urinary tract symptoms: Secondary | ICD-10-CM | POA: Diagnosis not present

## 2023-05-09 DIAGNOSIS — J441 Chronic obstructive pulmonary disease with (acute) exacerbation: Secondary | ICD-10-CM | POA: Diagnosis not present

## 2023-05-09 DIAGNOSIS — J189 Pneumonia, unspecified organism: Secondary | ICD-10-CM | POA: Diagnosis not present

## 2023-05-09 DIAGNOSIS — Z6824 Body mass index (BMI) 24.0-24.9, adult: Secondary | ICD-10-CM | POA: Diagnosis not present

## 2023-05-09 DIAGNOSIS — R9389 Abnormal findings on diagnostic imaging of other specified body structures: Secondary | ICD-10-CM | POA: Diagnosis not present

## 2023-05-09 DIAGNOSIS — G894 Chronic pain syndrome: Secondary | ICD-10-CM | POA: Diagnosis not present

## 2023-05-10 ENCOUNTER — Other Ambulatory Visit (HOSPITAL_COMMUNITY): Payer: Self-pay | Admitting: Family Medicine

## 2023-05-10 DIAGNOSIS — R9389 Abnormal findings on diagnostic imaging of other specified body structures: Secondary | ICD-10-CM

## 2023-05-10 DIAGNOSIS — J189 Pneumonia, unspecified organism: Secondary | ICD-10-CM

## 2023-05-16 ENCOUNTER — Ambulatory Visit (HOSPITAL_COMMUNITY)
Admission: RE | Admit: 2023-05-16 | Discharge: 2023-05-16 | Disposition: A | Discharge status: Home / Self Care | Payer: Medicare Other | Source: Ambulatory Visit | Attending: Family Medicine | Admitting: Family Medicine

## 2023-05-16 DIAGNOSIS — J189 Pneumonia, unspecified organism: Secondary | ICD-10-CM | POA: Insufficient documentation

## 2023-05-16 DIAGNOSIS — I700 Atherosclerosis of aorta: Secondary | ICD-10-CM | POA: Diagnosis not present

## 2023-05-16 DIAGNOSIS — R9389 Abnormal findings on diagnostic imaging of other specified body structures: Secondary | ICD-10-CM | POA: Diagnosis not present

## 2023-05-16 DIAGNOSIS — J432 Centrilobular emphysema: Secondary | ICD-10-CM | POA: Diagnosis not present

## 2023-05-21 NOTE — Progress Notes (Unsigned)
Terry Morrow, male    DOB: 01-28-1951    MRN: [id number]   Brief patient profile:  1  yowm  active smoker  with seasonal rhinitis esp fall  self-referred to pulmonary clinic in [place]  05/22/2023 by for doe around  2017  assoc with lingering cough and req  intermittent albuterol hfa and neb to back it up x and short course   prednisone only s maint rx.     History of Present Illness  05/22/2023  Pulmonary/ 1st office eval/ Terry Morrow / [facility] with no albuterol am of ov  Chief Complaint  Patient presents with   Consult    Copd   Dyspnea:  slow pace can go anywhere, uses [facility] parking due to back pain  Cough: typically in am pale yellow Sleep: flat bed/ one  s resp cc as long as uses saba at hs  SABA use: usually sev times per day  02: none    No obvious day to day or daytime pattern/variability or assoc mucus plugs or hemoptysis or cp or chest tightness, subjective wheeze or overt sinus or hb symptoms.   Also denies any obvious fluctuation of symptoms with weather or environmental changes or other aggravating or alleviating factors except as outlined above   No unusual exposure hx or h/o childhood pna/ asthma or knowledge of premature birth.  Current Allergies, Complete Past Medical History, Past Surgical History, Family History, and Social History were reviewed in [employer] record.  ROS  The following are not active complaints unless bolded Hoarseness, sore throat, dysphagia, dental problems, itching, sneezing,  nasal congestion or discharge of excess mucus or purulent secretions, ear ache,   fever, chills, sweats, unintended wt loss or wt gain, classically pleuritic or exertional cp,  orthopnea pnd or arm/hand swelling  or leg swelling, presyncope, palpitations, abdominal pain, anorexia, nausea, vomiting, diarrhea  or change in bowel habits or change in bladder habits, change in stools or change in urine, dysuria, hematuria,  rash, arthralgias,  visual complaints, headache, numbness, weakness or ataxia or problems with walking or coordination,  change in mood or  memory.            Outpatient Medications Prior to Visit  Medication Sig Dispense Refill   albuterol (PROVENTIL) (2.5 MG/3ML) 0.083% nebulizer solution Take 2.5 mg by nebulization daily as needed for wheezing or shortness of breath. Shortness of breathe     albuterol (VENTOLIN HFA) 108 (90 Base) MCG/ACT inhaler Inhale 2 puffs into the lungs every 6 (six) hours as needed for wheezing or shortness of breath.     amitriptyline (ELAVIL) 100 MG tablet Take 100 mg by mouth at bedtime.     gabapentin (NEURONTIN) 300 MG capsule Take 300 mg by mouth at bedtime.     Glycerin-Hypromellose-PEG 400 (DRY EYE RELIEF DROPS) 0.2-0.2-1 % SOLN Place 1 drop into both eyes daily as needed (Dry eyes).     HYDROcodone-acetaminophen (NORCO) 10-325 MG tablet Take 1 tablet by mouth every 6 (six) hours as needed for severe pain or moderate pain.     ipratropium-albuterol (DUONEB) 0.5-2.5 (3) MG/3ML SOLN Take 3 mLs by nebulization every 6 (six) hours as needed.     ketorolac (ACULAR) 0.5 % ophthalmic solution Place 1 drop into the right eye 4 (four) times daily.     linaclotide (LINZESS) 72 MCG capsule Take 72 mcg by mouth daily before breakfast.     Multiple Vitamins-Minerals (ONE-A-DAY MENS 50+ PO) Take 1 tablet by mouth  daily.     nitroGLYCERIN (NITROSTAT) 0.4 MG SL tablet Place 0.4 mg under the tongue every 5 (five) minutes as needed for chest pain.      pantoprazole (PROTONIX) 40 MG tablet Take 1 tablet (40 mg total) by mouth daily before breakfast. 90 tablet 3   Plecanatide (TRULANCE) 3 MG TABS Take 1 tablet (3 mg total) by mouth daily. 90 tablet 3   pravastatin (PRAVACHOL) 40 MG tablet Take 40 mg by mouth at bedtime.     prednisoLONE acetate (PRED FORTE) 1 % ophthalmic suspension Place 1 drop into the right eye 4 (four) times daily.     QUEtiapine (SEROQUEL) 50 MG tablet Take 50 mg by mouth at  bedtime.     verapamil (CALAN-SR) 180 MG CR tablet Take 180 mg by mouth at bedtime.     zolpidem (AMBIEN) 10 MG tablet Take 10 mg by mouth at bedtime.     No facility-administered medications prior to visit.    Past Medical History:  Diagnosis Date   GERD (gastroesophageal reflux disease)    Headache(784.0)    MIGRAINE  --  LAST ONE ACOUPLE DAYS AGO   History of kidney stones yrs ago   Hypertension    Insomnia    Mild emphysema (HCC)    Neuromuscular disorder (HCC)    neuropathy improved since off depakote   PONV (postoperative nausea and vomiting)    Prostate disorder    enlarged   Shortness of breath    with over exertion      Objective:     BP (!) 142/76   Pulse (!) 113   Ht 5\' 11"  (1.803 m)   Wt 173 lb (78.5 kg)   SpO2 98% Comment: ROOM AIR  BMI 24.13 kg/m   SpO2: 98 % (ROOM AIR)   Amb pleasant wm nad     HEENT : Oropharynx  clear   Nasal turbinates nl    NECK :  without  apparent JVD/ palpable Nodes/TM    LUNGS: no acc muscle use,  Min barrel  contour chest wall with bilateral  insp/exp rhonchi s localization  and  without cough on insp or exp maneuvers and min  Hyperresonant  to  percussion bilaterally    CV:  RRR  no s3 or murmur or increase in P2, and no edema   ABD:  soft and nontender with pos end  insp Hoover's  in the supine position.  No bruits or organomegaly appreciated   MS:  Nl gait/ ext warm without deformities Or obvious joint restrictions  calf tenderness, cyanosis - No obvious clubbing     SKIN: warm and dry without lesions    NEURO:  alert, approp, nl sensorium with  no motor or cerebellar deficits apparent.        I personally reviewed images and impression as follows:  Chest CT w/o contrast 05/16/23  No acute findings.           Assessment   COPD GOLD ?  AB Active smoker -  05/22/2023  After extensive coaching inhaler device,  effectiveness =    75% hfa so rx breztri and change saba to prn   Overusing saba in multiple  forms and having freq flares of AB so clearly Group D (now reclassified as E) in terms of symptom/risk and laba/lama/ICS  therefore appropriate rx at this point >>>  breztri and approp saba:  Re SABA :  I spent extra time with pt today reviewing appropriate use of  albuterol for prn use on exertion with the following points: 1) saba is for relief of sob that does not improve by walking a slower pace or resting but rather if the pt does not improve after trying this first. 2) If the pt is convinced, as many are, that saba helps recover from activity faster then it's easy to tell if this is the case by re-challenging : ie stop, take the inhaler, then p 5 minutes try the exact same activity (intensity of workload) that just caused the symptoms and see if they are substantially diminished or not after saba 3) if there is an activity that reproducibly causes the symptoms, try the saba 15 min before the activity on alternate days   If in fact the saba really does help, then fine to continue to use it prn but advised may need to look closer at the maintenance regimen being used to achieve better control of airways disease with exertion.     Cigarette smoker 4-5 min discussion re active cigarette smoking in addition to office E&M  Ask about tobacco use:   ongoing Advise quitting   I took an extended  opportunity with this patient to outline the consequences of continued cigarette use  in airway disorders based on all the data we have from the multiple national lung health studies (perfomed over decades at millions of dollars in cost)  indicating that smoking cessation, not choice of inhalers or pulmonary physicians, is the most important aspect of his  care.   Assess willingness:  Not committed at this point Assist in quit attempt:  Per PCP when ready Arrange follow up:   Follow up per Primary Care planned     Low-dose CT lung cancer screening is recommended for patients who are 40-90 years of age with a  20+ pack-year history of smoking and who are currently smoking or quit <=15 years ago. No coughing up blood  No unintentional weight loss of > 15 pounds in the last 6 months - pt is eligible for scanning yearly until age 51 > referred             Sandrea Hughs, MD 05/22/2023

## 2023-05-22 ENCOUNTER — Encounter: Payer: Self-pay | Admitting: Internal Medicine

## 2023-05-22 ENCOUNTER — Ambulatory Visit: Payer: Medicare Other | Admitting: Internal Medicine

## 2023-05-22 VITALS — BP 142/76 | HR 113 | Ht 71.0 in | Wt 173.0 lb

## 2023-05-22 DIAGNOSIS — J42 Unspecified chronic bronchitis: Secondary | ICD-10-CM | POA: Diagnosis not present

## 2023-05-22 DIAGNOSIS — J449 Chronic obstructive pulmonary disease, unspecified: Secondary | ICD-10-CM

## 2023-05-22 DIAGNOSIS — F17210 Nicotine dependence, cigarettes, uncomplicated: Secondary | ICD-10-CM | POA: Insufficient documentation

## 2023-05-22 MED ORDER — BREZTRI AEROSPHERE 160-9-4.8 MCG/ACT IN AERO
2.0000 | INHALATION_SPRAY | Freq: Two times a day (BID) | RESPIRATORY_TRACT | Status: DC
Start: 2023-05-22 — End: 2023-07-10

## 2023-05-22 MED ORDER — BREZTRI AEROSPHERE 160-9-4.8 MCG/ACT IN AERO: INHALATION_SPRAY | RESPIRATORY_TRACT | 11 refills | Status: DC

## 2023-05-22 NOTE — Assessment & Plan Note (Addendum)
Active smoker -  05/22/2023  After extensive coaching inhaler device,  effectiveness =    75% hfa so rx breztri and change saba to prn   Overusing saba in multiple forms and having freq flares of AB so clearly Group D (now reclassified as E) in terms of symptom/risk and laba/lama/ICS  therefore appropriate rx at this point >>>  breztri and approp saba:  Re SABA :  I spent extra time with pt today reviewing appropriate use of albuterol for prn use on exertion with the following points: 1) saba is for relief of sob that does not improve by walking a slower pace or resting but rather if the pt does not improve after trying this first. 2) If the pt is convinced, as many are, that saba helps recover from activity faster then it's easy to tell if this is the case by re-challenging : ie stop, take the inhaler, then p 5 minutes try the exact same activity (intensity of workload) that just caused the symptoms and see if they are substantially diminished or not after saba 3) if there is an activity that reproducibly causes the symptoms, try the saba 15 min before the activity on alternate days   If in fact the saba really does help, then fine to continue to use it prn but advised may need to look closer at the maintenance regimen being used to achieve better control of airways disease with exertion.  Return in 6 weeks with pfts in meantime if possible          Each maintenance medication was reviewed in detail including emphasizing most importantly the difference between maintenance and prns and under what circumstances the prns are to be triggered using an action plan format where appropriate.  Total time for H and P, chart review, counseling, reviewing hfa  device(s) and generating customized AVS unique to this office visit / same day charting  > 45 min new pt eval

## 2023-05-22 NOTE — Assessment & Plan Note (Signed)
4-5 min discussion re active cigarette smoking in addition to office E&M  Ask about tobacco use:   ongoing Advise quitting   I took an extended  opportunity with this patient to outline the consequences of continued cigarette use  in airway disorders based on all the data we have from the multiple national lung health studies (perfomed over decades at millions of dollars in cost)  indicating that smoking cessation, not choice of inhalers or pulmonary physicians, is the most important aspect of his  care.   Assess willingness:  Not committed at this point Assist in quit attempt:  Per PCP when ready Arrange follow up:   Follow up per Primary Care planned     Low-dose CT lung cancer screening is recommended for patients who are 56-4 years of age with a 20+ pack-year history of smoking and who are currently smoking or quit <=15 years ago. No coughing up blood  No unintentional weight loss of > 15 pounds in the last 6 months - pt is eligible for scanning yearly until age 28 > referred

## 2023-05-22 NOTE — Patient Instructions (Addendum)
I will call with the next step after I review the scan with the radiologist   Plan A = Automatic = Always=    Breztri Take 2 puffs first thing in am and then another 2 puffs about 12 hours later.    Work on inhaler technique:  relax and gently blow all the way out then take a nice smooth full deep breath back in, triggering the inhaler at same time you start breathing in.  Hold breath in for at least  5 seconds if you can. Blow out [employer]  thru nose. Rinse and gargle with water when done.  If mouth or throat bother you at all,  try brushing teeth/gums/tongue with arm and hammer toothpaste/ make a slurry and gargle and spit out.       Plan B = Backup (to supplement plan A, not to replace it) Only use your albuterol inhaler as a rescue medication to be used if you can't catch your breath by resting or doing a relaxed purse lip breathing pattern.  - The less you use it, the better it will work when you need it. - Ok to use the inhaler up to 2 puffs  every 4 hours if you must but call for appointment if use goes up over your usual need - Don't leave home without it !!  (think of it like the spare tire for your car/ starter fluid for engine)   Plan C = Crisis (instead of Plan B but only if Plan B stops working) - only use your albuterol nebulizer if you first try Plan B and it fails to help > ok to use the nebulizer up to every 4 hours but if start needing it regularly call for immediate appointment   The key is to stop smoking completely before smoking completely stops you!   Please schedule a follow up office visit in 6 weeks, call sooner if needed PFTs will need to be done if possible in meantime

## 2023-07-09 NOTE — Progress Notes (Unsigned)
Terry Morrow, male    DOB: 1950/07/20    MRN: [id number]   Brief patient profile:  37  yowm  active smoker  with seasonal rhinitis esp fall  self-referred to pulmonary clinic in [place]  05/22/2023 by for doe around  2017  assoc with lingering cough and req  intermittent albuterol hfa and neb to back it up x and short course   prednisone only s maint rx.     History of Present Illness  05/22/2023  Pulmonary/ 1st office eval/ Terry Morrow / [place] [facility] with no albuterol am of ov  Chief Complaint  Patient presents with   Consult    Copd   Dyspnea:  slow pace can go anywhere, uses [facility] parking due to back pain  Cough: typically in am pale yellow Sleep: flat bed/ one  s resp cc as long as uses saba at hs  SABA use: usually sev times per day  02: none  Rec Plan A = Automatic = Always=    Breztri Take 2 puffs first thing in am and then another 2 puffs about 12 hours later.   Work on inhaler technique:   Plan B = Backup (to supplement plan A, not to replace it) Only use your albuterol inhaler as a rescue medication   Plan C = Crisis (instead of Plan B but only if Plan B stops working) - only use your albuterol nebulizer if you first try Plan B  The key is to stop smoking completely before smoking completely stops you!   Please schedule a follow up office visit in 6 weeks, call sooner if needed PFTs will need to be done if possible in meantime    07/10/2023  f/u ov/[place] office/Terry Morrow re: COPD / GOLD ? With  Emphysema on CT  maint on breztri   needs referral to [facility] program  Chief Complaint  Patient presents with   Follow-up    6 week follow up   Dyspnea:  MMRC2 = can't walk a nl pace on a flat grade s sob but does fine slow and flat   Cough: white few min - always clear by 30 min  Sleeping:  flat bed/ one pillow s   resp cc  SABA use: 0- 1 per day  02: none  Lung cancer screening:  CT done 05/16/23 ok x Centrilobular and paraseptal emphysema.     No obvious day to day or  daytime variability or assoc  purulent sputum or mucus plugs or hemoptysis or cp or chest tightness, subjective wheeze or overt sinus or hb symptoms.    Also denies any obvious fluctuation of symptoms with weather or environmental changes or other aggravating or alleviating factors except as outlined above   No unusual exposure hx or h/o childhood pna/ asthma or knowledge of premature birth.  Current Allergies, Complete Past Medical History, Past Surgical History, Family History, and Social History were reviewed in [employer] record.  ROS  The following are not active complaints unless bolded Hoarseness, sore throat, dysphagia, dental problems, itching, sneezing,  nasal congestion or discharge of excess mucus or purulent secretions, ear ache,   fever, chills, sweats, unintended wt loss or wt gain, classically pleuritic or exertional cp,  orthopnea pnd or arm/hand swelling  or leg swelling, presyncope, palpitations, abdominal pain, anorexia, nausea, vomiting, diarrhea  or change in bowel habits or change in bladder habits, change in stools or change in urine, dysuria, hematuria,  rash, arthralgias, visual complaints, headache, numbness, weakness  or ataxia or problems with walking or coordination,  change in mood or  memory.        Current Meds  Medication Sig   albuterol (PROVENTIL) (2.5 MG/3ML) 0.083% nebulizer solution Take 2.5 mg by nebulization daily as needed for wheezing or shortness of breath. Shortness of breathe   albuterol (VENTOLIN HFA) 108 (90 Base) MCG/ACT inhaler Inhale 2 puffs into the lungs every 6 (six) hours as needed for wheezing or shortness of breath.   amitriptyline (ELAVIL) 100 MG tablet Take 100 mg by mouth at bedtime.   Budeson-Glycopyrrol-Formoterol (BREZTRI AEROSPHERE) 160-9-4.8 MCG/ACT AERO Take 2 puffs first thing in am and then another 2 puffs about 12 hours later.   gabapentin (NEURONTIN) 300 MG capsule Take 300 mg by mouth at bedtime.    Glycerin-Hypromellose-PEG 400 (DRY EYE RELIEF DROPS) 0.2-0.2-1 % SOLN Place 1 drop into both eyes daily as needed (Dry eyes).   HYDROcodone-acetaminophen (NORCO) 10-325 MG tablet Take 1 tablet by mouth every 6 (six) hours as needed for severe pain or moderate pain.   ketorolac (ACULAR) 0.5 % ophthalmic solution Place 1 drop into the right eye 4 (four) times daily.   linaclotide (LINZESS) 72 MCG capsule Take 72 mcg by mouth daily before breakfast.   Multiple Vitamins-Minerals (ONE-A-DAY MENS 50+ PO) Take 1 tablet by mouth daily.   nitroGLYCERIN (NITROSTAT) 0.4 MG SL tablet Place 0.4 mg under the tongue every 5 (five) minutes as needed for chest pain.    pantoprazole (PROTONIX) 40 MG tablet Take 1 tablet (40 mg total) by mouth daily before breakfast.   Plecanatide (TRULANCE) 3 MG TABS Take 1 tablet (3 mg total) by mouth daily.   pravastatin (PRAVACHOL) 40 MG tablet Take 40 mg by mouth at bedtime.   prednisoLONE acetate (PRED FORTE) 1 % ophthalmic suspension Place 1 drop into the right eye 4 (four) times daily.   QUEtiapine (SEROQUEL) 50 MG tablet Take 50 mg by mouth at bedtime.   verapamil (CALAN-SR) 180 MG CR tablet Take 180 mg by mouth at bedtime.   zolpidem (AMBIEN) 10 MG tablet Take 10 mg by mouth at bedtime.           Past Medical History:  Diagnosis Date   GERD (gastroesophageal reflux disease)    Headache(784.0)    MIGRAINE  --  LAST ONE ACOUPLE DAYS AGO   History of kidney stones yrs ago   Hypertension    Insomnia    Mild emphysema (HCC)    Neuromuscular disorder (HCC)    neuropathy improved since off depakote   PONV (postoperative nausea and vomiting)    Prostate disorder    enlarged   Shortness of breath    with over exertion      Objective:     Vital signs reviewed  07/10/2023  - Note at rest 02 sats  96% on RA   General appearance:    thin amb wm nad    HEENT : Oropharynx  clear/ edentulous     NECK :  without  apparent JVD/ palpable Nodes/TM    LUNGS: no acc  muscle use,  Min barrel  contour chest wall with bilateral  slightly decreased bs s audible wheeze and  without cough on insp or exp maneuvers and min  Hyperresonant  to  percussion bilaterally    CV:  RRR  no s3 or murmur or increase in P2, and no edema   ABD:  soft and nontender with pos end  insp Hoover's  in the  supine position.  No bruits or organomegaly appreciated   MS:  Nl gait/ ext warm without deformities Or obvious joint restrictions  calf tenderness, cyanosis or clubbing     SKIN: warm and dry without lesions    NEURO:  alert, approp, nl sensorium with  no motor or cerebellar deficits apparent.                 Assessment

## 2023-07-10 ENCOUNTER — Ambulatory Visit (INDEPENDENT_AMBULATORY_CARE_PROVIDER_SITE_OTHER): Payer: Medicare Other | Admitting: Internal Medicine

## 2023-07-10 ENCOUNTER — Encounter: Payer: Self-pay | Admitting: Internal Medicine

## 2023-07-10 VITALS — BP 118/66 | HR 114 | Ht 71.0 in | Wt 174.4 lb

## 2023-07-10 DIAGNOSIS — F17210 Nicotine dependence, cigarettes, uncomplicated: Secondary | ICD-10-CM

## 2023-07-10 DIAGNOSIS — J449 Chronic obstructive pulmonary disease, unspecified: Secondary | ICD-10-CM | POA: Diagnosis not present

## 2023-07-10 MED ORDER — BREZTRI AEROSPHERE 160-9-4.8 MCG/ACT IN AERO: INHALATION_SPRAY | RESPIRATORY_TRACT | 11 refills | Status: DC

## 2023-07-10 NOTE — Patient Instructions (Addendum)
My office will be contacting you by phone for referral to lung cancer screening   [phone]) - if you don't hear back from my office within one week,  please call us back or notify us thru MyChart and we'll address it right away.     No change in medication for now   - will consider pulmonary rehab once PFTs available  Stiolto may be an option over Breztri depending oncosts   Please schedule a follow up visit in 6  months but call sooner if needed

## 2023-07-10 NOTE — Assessment & Plan Note (Signed)
Counseled re importance of smoking cessation but did not meet time criteria for separate billing    Low-dose CT lung cancer screening is recommended for patients who are 92-74 years of age with a 20+ pack-year history of smoking and who are currently smoking or quit <=15 years ago. No coughing up blood  No unintentional weight loss of > 15 pounds in the last 6 months - pt is eligible for scanning yearly until age 59 > referred today           Each maintenance medication was reviewed in detail including emphasizing most importantly the difference between maintenance and prns and under what circumstances the prns are to be triggered using an action plan format where appropriate.  Total time for H and P, chart review, counseling, reviewing hfa device(s) and generating customized AVS unique to this office visit / same day charting = 25 min

## 2023-07-10 NOTE — Assessment & Plan Note (Signed)
Active smoker -  05/16/23  CT chest :  Centrilobular and paraseptal emphysema.   -  05/22/2023  After extensive coaching inhaler device,  effectiveness =    75% hfa so rx breztri and change saba to prn    Group D (now reclassified as E) in terms of symptom/risk and laba/lama/ICS  therefore appropriate rx at this point >>>  continue breztri pending pfts plus approp saba

## 2023-07-11 ENCOUNTER — Ambulatory Visit (HOSPITAL_COMMUNITY)
Admission: RE | Admit: 2023-07-11 | Discharge: 2023-07-11 | Disposition: A | Discharge status: Home / Self Care | Payer: Medicare Other | Source: Ambulatory Visit | Attending: Internal Medicine | Admitting: Internal Medicine

## 2023-07-11 DIAGNOSIS — Z87891 Personal history of nicotine dependence: Secondary | ICD-10-CM | POA: Diagnosis not present

## 2023-07-11 DIAGNOSIS — J42 Unspecified chronic bronchitis: Secondary | ICD-10-CM | POA: Diagnosis not present

## 2023-07-11 DIAGNOSIS — Z79899 Other long term (current) drug therapy: Secondary | ICD-10-CM | POA: Diagnosis not present

## 2023-07-11 LAB — PULMONARY FUNCTION TEST
DL/VA % pred: 89 %
DL/VA: 3.51 ml/min/mmHg/L
DLCO unc % pred: 78 %
DLCO unc: 20.48 ml/min/mmHg
FEF 25-75 Post: 2.59 L/s
FEF 25-75 Pre: 2.85 L/s
FEF2575-%Change-Post: -9 %
FEF2575-%Pred-Post: 106 %
FEF2575-%Pred-Pre: 117 %
FEV1-%Change-Post: 0 %
FEV1-%Pred-Post: 86 %
FEV1-%Pred-Pre: 86 %
FEV1-Post: 2.83 L
FEV1-Pre: 2.82 L
FEV1FVC-%Change-Post: -2 %
FEV1FVC-%Pred-Pre: 108 %
FEV6-%Change-Post: 1 %
FEV6-%Pred-Post: 85 %
FEV6-%Pred-Pre: 84 %
FEV6-Post: 3.61 L
FEV6-Pre: 3.56 L
FEV6FVC-%Change-Post: -1 %
FEV6FVC-%Pred-Post: 104 %
FEV6FVC-%Pred-Pre: 105 %
FVC-%Change-Post: 2 %
FVC-%Pred-Post: 82 %
FVC-%Pred-Pre: 79 %
FVC-Post: 3.69 L
FVC-Pre: 3.58 L
Post FEV1/FVC ratio: 77 %
Post FEV6/FVC ratio: 98 %
Pre FEV1/FVC ratio: 79 %
Pre FEV6/FVC Ratio: 100 %
RV % pred: 117 %
RV: 3.02 L
TLC % pred: 96 %
TLC: 7.01 L

## 2023-07-11 MED ORDER — ALBUTEROL SULFATE (2.5 MG/3ML) 0.083% IN NEBU
2.5000 mg | INHALATION_SOLUTION | Freq: Once | RESPIRATORY_TRACT | Status: AC
  Administered 2023-07-11: 2.5 mg via RESPIRATORY_TRACT

## 2023-07-18 ENCOUNTER — Encounter (INDEPENDENT_AMBULATORY_CARE_PROVIDER_SITE_OTHER): Payer: Medicare Other | Admitting: Ophthalmology

## 2023-07-18 DIAGNOSIS — H43813 Vitreous degeneration, bilateral: Secondary | ICD-10-CM

## 2023-07-18 DIAGNOSIS — H59031 Cystoid macular edema following cataract surgery, right eye: Secondary | ICD-10-CM | POA: Diagnosis not present

## 2023-07-18 DIAGNOSIS — H35033 Hypertensive retinopathy, bilateral: Secondary | ICD-10-CM | POA: Diagnosis not present

## 2023-07-18 DIAGNOSIS — I10 Essential (primary) hypertension: Secondary | ICD-10-CM | POA: Diagnosis not present

## 2023-08-01 DIAGNOSIS — Z6824 Body mass index (BMI) 24.0-24.9, adult: Secondary | ICD-10-CM | POA: Diagnosis not present

## 2023-08-01 DIAGNOSIS — G43009 Migraine without aura, not intractable, without status migrainosus: Secondary | ICD-10-CM | POA: Diagnosis not present

## 2023-08-24 ENCOUNTER — Other Ambulatory Visit: Payer: Self-pay | Admitting: Gastroenterology

## 2023-08-26 NOTE — Progress Notes (Unsigned)
 Terry Morrow, male    DOB: Nov 29, 1950    MRN: [id number]   Brief patient profile:  70  yowm  active smoker  with seasonal rhinitis esp fall  self-referred to pulmonary clinic in [place]  05/22/2023 by for doe around  2017  assoc with lingering cough and req  intermittent albuterol hfa and neb to back it up x and short course   prednisone only s maint rx.     History of Present Illness  05/22/2023  Pulmonary/ 1st office eval/ Kenzey Birkland / [place] [facility] with no albuterol am of ov  Chief Complaint  Patient presents with   Consult    Copd   Dyspnea:  slow pace can go anywhere, uses [facility] parking due to back pain  Cough: typically in am pale yellow Sleep: flat bed/ one  s resp cc as long as uses saba at hs  SABA use: usually sev times per day  02: none  Rec Plan A = Automatic = Always=    Breztri Take 2 puffs first thing in am and then another 2 puffs about 12 hours later.   Work on inhaler technique:   Plan B = Backup (to supplement plan A, not to replace it) Only use your albuterol inhaler as a rescue medication   Plan C = Crisis (instead of Plan B but only if Plan B stops working) - only use your albuterol nebulizer if you first try Plan B  The key is to stop smoking completely before smoking completely stops you!   Please schedule a follow up office visit in 6 weeks, call sooner if needed PFTs will need to be done if possible in meantime    07/10/2023  f/u ov/[place] office/Dahlila Pfahler re: COPD / GOLD ? With  Emphysema on CT  maint on breztri   needs referral to [facility] program  Chief Complaint  Patient presents with   Follow-up    6 week follow up   Dyspnea:  MMRC2 = can't walk a nl pace on a flat grade s sob but does fine slow and flat   Cough: white few min - always clear by 30 min  Sleeping:  flat bed/ one pillow s   resp cc  SABA use: 0- 1 per day  02: none  Lung cancer screening:  CT done 05/16/23 ok x Centrilobular and paraseptal emphysema. Rec No change in medication for  now   - will consider pulmonary rehab once PFTs available Stiolto may be an option over Breztri depending oncosts  Please schedule a follow up visit in 6  months but call sooner if needed    08/29/2023  f/u ov/[place] office/Tatijana Bierly re: copd GOLD 0/AB  maint on breztri / hfa 75%  Chief Complaint  Patient presents with   Follow-up    6 week follow up , pt has no new concerns   Dyspnea:  yardwork / cuts own firewood  Cough: none  Sleeping: flat bed/ one pillow no  resp cc  SABA use: way less  - maybe every other day hfa/ never neb 02: none   No obvious day to day or daytime variability or assoc excess/ purulent sputum or mucus plugs or hemoptysis or cp or chest tightness, subjective wheeze or overt sinus or hb symptoms.    Also denies any obvious fluctuation of symptoms with weather or environmental changes or other aggravating or alleviating factors except as outlined above   No unusual exposure hx or h/o childhood pna/ asthma or knowledge  of premature birth.  Current Allergies, Complete Past Medical History, Past Surgical History, Family History, and Social History were reviewed in [employer] record.  ROS  The following are not active complaints unless bolded Hoarseness, sore throat, dysphagia, dental problems, itching, sneezing,  nasal congestion or discharge of excess mucus or purulent secretions, ear ache,   fever, chills, sweats, unintended wt loss or wt gain, classically pleuritic or exertional cp,  orthopnea pnd or arm/hand swelling  or leg swelling, presyncope, palpitations, abdominal pain, anorexia, nausea, vomiting, diarrhea  or change in bowel habits or change in bladder habits, change in stools or change in urine, dysuria, hematuria,  rash, arthralgias, visual complaints, headache, numbness, weakness or ataxia or problems with walking or coordination,  change in mood or  memory.        Current Meds  Medication Sig   albuterol (PROVENTIL) (2.5 MG/3ML)  0.083% nebulizer solution Take 2.5 mg by nebulization daily as needed for wheezing or shortness of breath. Shortness of breathe   albuterol (VENTOLIN HFA) 108 (90 Base) MCG/ACT inhaler Inhale 2 puffs into the lungs every 6 (six) hours as needed for wheezing or shortness of breath.   amitriptyline (ELAVIL) 100 MG tablet Take 100 mg by mouth at bedtime.   Budeson-Glycopyrrol-Formoterol (BREZTRI AEROSPHERE) 160-9-4.8 MCG/ACT AERO Take 2 puffs first thing in am and then another 2 puffs about 12 hours later.   budeson-glycopyrrolate-formoterol (BREZTRI AEROSPHERE) 160-9-4.8 MCG/ACT AERO Inhale 2 puffs into the lungs in the morning and at bedtime.   gabapentin (NEURONTIN) 300 MG capsule Take 300 mg by mouth at bedtime.   Glycerin-Hypromellose-PEG 400 (DRY EYE RELIEF DROPS) 0.2-0.2-1 % SOLN Place 1 drop into both eyes daily as needed (Dry eyes).   HYDROcodone-acetaminophen (NORCO) 10-325 MG tablet Take 1 tablet by mouth every 6 (six) hours as needed for severe pain or moderate pain.   ketorolac (ACULAR) 0.5 % ophthalmic solution Place 1 drop into the right eye 4 (four) times daily.   linaclotide (LINZESS) 72 MCG capsule Take 72 mcg by mouth daily before breakfast.   Multiple Vitamins-Minerals (ONE-A-DAY MENS 50+ PO) Take 1 tablet by mouth daily.   nitroGLYCERIN (NITROSTAT) 0.4 MG SL tablet Place 0.4 mg under the tongue every 5 (five) minutes as needed for chest pain.    pantoprazole (PROTONIX) 40 MG tablet Take 1 tablet (40 mg total) by mouth daily before breakfast.   Plecanatide (TRULANCE) 3 MG TABS Take 1 tablet (3 mg total) by mouth daily.   pravastatin (PRAVACHOL) 40 MG tablet Take 40 mg by mouth at bedtime.   prednisoLONE acetate (PRED FORTE) 1 % ophthalmic suspension Place 1 drop into the right eye 4 (four) times daily.   QUEtiapine (SEROQUEL) 50 MG tablet Take 50 mg by mouth at bedtime.   SUMAtriptan (IMITREX) 100 MG tablet Take by mouth.   verapamil (CALAN-SR) 180 MG CR tablet Take 180 mg by mouth  at bedtime.   zolpidem (AMBIEN) 10 MG tablet Take 10 mg by mouth at bedtime.             Past Medical History:  Diagnosis Date   GERD (gastroesophageal reflux disease)    Headache(784.0)    MIGRAINE  --  LAST ONE ACOUPLE DAYS AGO   History of kidney stones yrs ago   Hypertension    Insomnia    Mild emphysema (HCC)    Neuromuscular disorder (HCC)    neuropathy improved since off depakote   PONV (postoperative nausea and vomiting)  Prostate disorder    enlarged   Shortness of breath    with over exertion      Objective:    Wts   08/29/2023       175   07/10/23 174 lb 6.4 oz (79.1 kg)  05/22/23 173 lb (78.5 kg)  03/31/23 177 lb 3.2 oz (80.4 kg)      Vital signs reviewed  08/29/2023  - Note at rest 02 sats  95% on RA   General appearance:    amb  slt gruff voice/ elderly pleasant wm nad      HEENT : Oropharynx  clear      NECK :  without  apparent JVD/ palpable Nodes/TM    LUNGS: no acc muscle use,  Min barrel  contour chest wall with bilateral  slightly decreased bs s audible wheeze and  without cough on insp or exp maneuvers and min  Hyperresonant  to  percussion bilaterally    CV:  RRR  no s3 or murmur or increase in P2, and no edema   ABD:  soft and nontender with pos end  insp Hoover's  in the supine position.  No bruits or organomegaly appreciated   MS:  Nl gait/ ext warm without deformities Or obvious joint restrictions  calf tenderness, cyanosis or clubbing     SKIN: warm and dry without lesions    NEURO:  alert, approp, nl sensorium with  no motor or cerebellar deficits apparent.                 Assessment

## 2023-08-29 ENCOUNTER — Ambulatory Visit: Payer: Medicare Other | Admitting: Internal Medicine

## 2023-08-29 ENCOUNTER — Encounter: Payer: Self-pay | Admitting: Internal Medicine

## 2023-08-29 VITALS — BP 132/71 | HR 114 | Ht 71.0 in | Wt 175.0 lb

## 2023-08-29 DIAGNOSIS — F17210 Nicotine dependence, cigarettes, uncomplicated: Secondary | ICD-10-CM

## 2023-08-29 DIAGNOSIS — J449 Chronic obstructive pulmonary disease, unspecified: Secondary | ICD-10-CM | POA: Diagnosis not present

## 2023-08-29 MED ORDER — BREZTRI AEROSPHERE 160-9-4.8 MCG/ACT IN AERO: 2.0000 | INHALATION_SPRAY | Freq: Two times a day (BID) | RESPIRATORY_TRACT | Status: AC

## 2023-08-29 NOTE — Patient Instructions (Signed)
Work on inhaler technique:  relax and gently blow all the way out then take a nice smooth full deep breath back in, triggering the inhaler at same time you start breathing in.  Hold breath in for at least  5 seconds if you can. Blow out breztri  thru nose. Rinse and gargle with water when done.  If mouth or throat bother you at all,  try brushing teeth/gums/tongue with arm and hammer toothpaste/ make a slurry and gargle and spit out.   Please schedule a follow up visit in 6 months but call sooner if needed

## 2023-08-31 NOTE — Assessment & Plan Note (Signed)
 Active smoker -  05/16/23  CT chest :  Centrilobular and paraseptal emphysema.   -  05/22/2023  After extensive coaching inhaler device,  effectiveness =    75% hfa so rx breztri and change saba to prn  - PFT's  07/11/23  FEV1 2.83 (86 % ) ratio 0.77  p 0 % improvement from saba p nothing  prior to study with DLCO  26 (78%)   and FV curve nl curvature and ERV 33% at wt 175   - 08/29/2023  After extensive coaching inhaler device,  effectiveness =    75% hfa   Group D (now reclassified as E) in terms of symptom/risk and laba/lama/ICS  therefore appropriate rx at this point >>>  breztri (though could probably do just as well on symbicort 80 as does not meet criteria for COPD ) and try arm and hammer toothpaste p using any form of ICS to help reduced effects on upper airway.

## 2023-08-31 NOTE — Assessment & Plan Note (Signed)
Counseled re importance of smoking cessation but did not meet time criteria for separate billing           Each maintenance medication was reviewed in detail including emphasizing most importantly the difference between maintenance and prns and under what circumstances the prns are to be triggered using an action plan format where appropriate.  Total time for H and P, chart review, counseling, reviewing hfa device(s) and generating customized AVS unique to this office visit / same day charting =  21 min       

## 2023-09-11 ENCOUNTER — Telehealth: Payer: Self-pay

## 2023-09-11 MED ORDER — PANTOPRAZOLE SODIUM 40 MG PO TBEC: 40.0000 mg | DELAYED_RELEASE_TABLET | Freq: Every day | ORAL | 3 refills | Status: DC

## 2023-09-11 NOTE — Addendum Note (Signed)
 Addended by: Tiffany Kocher on: 09/11/2023 12:51 PM   Modules accepted: Orders

## 2023-09-11 NOTE — Telephone Encounter (Signed)
 Refill request was received via fax from [facility] for Pantoprazole Sod DR 40 mg tab qty: 90. Pt was last seen on 03/31/2023.

## 2023-09-26 ENCOUNTER — Encounter: Payer: Self-pay | Admitting: *Deleted

## 2023-10-16 ENCOUNTER — Encounter (INDEPENDENT_AMBULATORY_CARE_PROVIDER_SITE_OTHER): Payer: Medicare Other | Admitting: Ophthalmology

## 2023-10-16 DIAGNOSIS — H35033 Hypertensive retinopathy, bilateral: Secondary | ICD-10-CM | POA: Diagnosis not present

## 2023-10-16 DIAGNOSIS — I10 Essential (primary) hypertension: Secondary | ICD-10-CM | POA: Diagnosis not present

## 2023-10-16 DIAGNOSIS — H43813 Vitreous degeneration, bilateral: Secondary | ICD-10-CM

## 2023-10-24 DIAGNOSIS — Z049 Encounter for examination and observation for unspecified reason: Secondary | ICD-10-CM | POA: Diagnosis not present

## 2023-10-24 DIAGNOSIS — G44009 Cluster headache syndrome, unspecified, not intractable: Secondary | ICD-10-CM | POA: Diagnosis not present

## 2023-11-01 DIAGNOSIS — G44009 Cluster headache syndrome, unspecified, not intractable: Secondary | ICD-10-CM | POA: Diagnosis not present

## 2023-11-02 ENCOUNTER — Telehealth (INDEPENDENT_AMBULATORY_CARE_PROVIDER_SITE_OTHER): Payer: Self-pay

## 2023-11-02 NOTE — Telephone Encounter (Signed)
 Patient called and wanted to know if he could have an MRI with a cochlear implant, Per Dr Darlin Ehrlich this is not possible.  I left a message for the patient with this information

## 2023-11-06 ENCOUNTER — Telehealth (INDEPENDENT_AMBULATORY_CARE_PROVIDER_SITE_OTHER): Payer: Self-pay

## 2023-11-06 ENCOUNTER — Other Ambulatory Visit (INDEPENDENT_AMBULATORY_CARE_PROVIDER_SITE_OTHER): Payer: Self-pay

## 2023-11-06 NOTE — Telephone Encounter (Signed)
 Patient called in reference to his [employer] implant, stated that [facility] needed some info on it and if it was ok to have the MRI done.  I researched the patient and found that Dr Donalee Fruits with [facility] did the implant in 2014.  I called and left a message for Dr Donalee Fruits assistant Oralee Billow) to contact the patient about the info needed and to also call [facility] about his MRI

## 2023-11-07 DIAGNOSIS — G44009 Cluster headache syndrome, unspecified, not intractable: Secondary | ICD-10-CM | POA: Diagnosis not present

## 2023-11-07 DIAGNOSIS — G518 Other disorders of facial nerve: Secondary | ICD-10-CM | POA: Diagnosis not present

## 2023-11-07 DIAGNOSIS — M542 Cervicalgia: Secondary | ICD-10-CM | POA: Diagnosis not present

## 2023-11-07 DIAGNOSIS — M791 Myalgia, unspecified site: Secondary | ICD-10-CM | POA: Diagnosis not present

## 2023-11-15 ENCOUNTER — Other Ambulatory Visit: Payer: Self-pay | Admitting: Specialist

## 2023-11-15 DIAGNOSIS — G4452 New daily persistent headache (NDPH): Secondary | ICD-10-CM

## 2023-11-17 ENCOUNTER — Ambulatory Visit
Admission: RE | Admit: 2023-11-17 | Discharge: 2023-11-17 | Disposition: A | Discharge status: Home / Self Care | Source: Ambulatory Visit | Attending: Specialist | Admitting: Specialist

## 2023-11-17 DIAGNOSIS — G4452 New daily persistent headache (NDPH): Secondary | ICD-10-CM | POA: Diagnosis not present

## 2023-11-17 DIAGNOSIS — I6782 Cerebral ischemia: Secondary | ICD-10-CM | POA: Diagnosis not present

## 2023-11-20 ENCOUNTER — Other Ambulatory Visit (HOSPITAL_COMMUNITY)

## 2023-11-21 DIAGNOSIS — M791 Myalgia, unspecified site: Secondary | ICD-10-CM | POA: Diagnosis not present

## 2023-11-21 DIAGNOSIS — M542 Cervicalgia: Secondary | ICD-10-CM | POA: Diagnosis not present

## 2023-11-21 DIAGNOSIS — G44009 Cluster headache syndrome, unspecified, not intractable: Secondary | ICD-10-CM | POA: Diagnosis not present

## 2023-11-21 DIAGNOSIS — G518 Other disorders of facial nerve: Secondary | ICD-10-CM | POA: Diagnosis not present

## 2023-12-02 DIAGNOSIS — G44009 Cluster headache syndrome, unspecified, not intractable: Secondary | ICD-10-CM | POA: Diagnosis not present

## 2023-12-07 DIAGNOSIS — M791 Myalgia, unspecified site: Secondary | ICD-10-CM | POA: Diagnosis not present

## 2023-12-07 DIAGNOSIS — M542 Cervicalgia: Secondary | ICD-10-CM | POA: Diagnosis not present

## 2023-12-07 DIAGNOSIS — G44009 Cluster headache syndrome, unspecified, not intractable: Secondary | ICD-10-CM | POA: Diagnosis not present

## 2023-12-07 DIAGNOSIS — G518 Other disorders of facial nerve: Secondary | ICD-10-CM | POA: Diagnosis not present

## 2023-12-07 DIAGNOSIS — Z79899 Other long term (current) drug therapy: Secondary | ICD-10-CM | POA: Diagnosis not present

## 2023-12-21 DIAGNOSIS — M542 Cervicalgia: Secondary | ICD-10-CM | POA: Diagnosis not present

## 2023-12-21 DIAGNOSIS — M791 Myalgia, unspecified site: Secondary | ICD-10-CM | POA: Diagnosis not present

## 2023-12-21 DIAGNOSIS — G518 Other disorders of facial nerve: Secondary | ICD-10-CM | POA: Diagnosis not present

## 2023-12-21 DIAGNOSIS — G44009 Cluster headache syndrome, unspecified, not intractable: Secondary | ICD-10-CM | POA: Diagnosis not present

## 2023-12-27 ENCOUNTER — Telehealth: Payer: Self-pay | Admitting: *Deleted

## 2023-12-27 NOTE — Telephone Encounter (Signed)
 Pt called to schedule his colonoscopy. Pt stated he never got the questionnaire that was sent out to him. Advised pt will mail another questionnaire and to fill it out and mail back to us . Verbalized understanding.

## 2024-01-01 DIAGNOSIS — G44009 Cluster headache syndrome, unspecified, not intractable: Secondary | ICD-10-CM | POA: Diagnosis not present

## 2024-01-03 ENCOUNTER — Telehealth: Payer: Self-pay | Admitting: Gastroenterology

## 2024-01-03 NOTE — Telephone Encounter (Signed)
 Left message to schedule office visit, received patient's colonoscopy questionnaire and needs to come to the office first.

## 2024-01-11 DIAGNOSIS — M791 Myalgia, unspecified site: Secondary | ICD-10-CM | POA: Diagnosis not present

## 2024-01-11 DIAGNOSIS — G44009 Cluster headache syndrome, unspecified, not intractable: Secondary | ICD-10-CM | POA: Diagnosis not present

## 2024-01-11 DIAGNOSIS — G518 Other disorders of facial nerve: Secondary | ICD-10-CM | POA: Diagnosis not present

## 2024-01-11 DIAGNOSIS — M542 Cervicalgia: Secondary | ICD-10-CM | POA: Diagnosis not present

## 2024-01-21 NOTE — Progress Notes (Unsigned)
 Terry Morrow, male    DOB: 04/16/1951    MRN: [id number]   Brief patient profile:  73  yowm  active smoker  with seasonal rhinitis esp fall  self-referred to pulmonary clinic in [place]  05/22/2023 by for doe around  2017  assoc with lingering cough and req  intermittent albuterol  hfa and neb to back it up x and short course   prednisone  only s maint rx.     History of Present Illness  05/22/2023  Pulmonary/ 1st office eval/ Heli Dino / [place] [facility] with no albuterol  am of ov  Chief Complaint  Patient presents with   Consult    Copd   Dyspnea:  slow pace can go anywhere, uses [facility] parking due to back pain  Cough: typically in am pale yellow Sleep: flat bed/ one  s resp cc as long as uses saba at hs  SABA use: usually sev times per day  02: none  Rec Plan A = Automatic = Always=    Breztri  Take 2 puffs first thing in am and then another 2 puffs about 12 hours later.   Work on inhaler technique:   Plan B = Backup (to supplement plan A, not to replace it) Only use your albuterol  inhaler as a rescue medication   Plan C = Crisis (instead of Plan B but only if Plan B stops working) - only use your albuterol  nebulizer if you first try Plan B  The key is to stop smoking completely before smoking completely stops you!    08/29/2023  f/u ov/[place] office/Sarayah Bacchi re: copd GOLD 0/AB  maint on breztri  / hfa 74%  Chief Complaint  Patient presents with   Follow-up    6 week follow up , pt has no new concerns   Dyspnea:  yardwork / cuts own firewood  Cough: none  Sleeping: flat bed/ one pillow no  resp cc  SABA use: way less  - maybe every other day hfa/ never neb 02: none  Rec Work on inhaler technique:    Please schedule a follow up visit in 6 months but call sooner if needed     01/22/2024  f/u ov/[place] office/Teela Narducci re: copd GOLD 0/AB maint on Breztri   still  smoking  Chief Complaint  Patient presents with   COPD    No problems   Dyspnea:  yardwork / toting firewood ok   Cough: none  Sleeping: flat bed one pillow s noct resp cc  SABA use: mostly in am  02: none   Lung cancer screening: referred 01/22/2024    No obvious day to day or daytime variability or assoc excess/ purulent sputum or mucus plugs or hemoptysis or cp or chest tightness, subjective wheeze or overt sinus or hb symptoms.    Also denies any obvious fluctuation of symptoms with weather or environmental changes or other aggravating or alleviating factors except as outlined above   No unusual exposure hx or h/o childhood pna/ asthma or knowledge of premature birth.  Current Allergies, Complete Past Medical History, Past Surgical History, Family History, and Social History were reviewed in [employer] record.  ROS  The following are not active complaints unless bolded Hoarseness, sore throat, dysphagia, dental problems, itching, sneezing,  nasal congestion or discharge of excess mucus or purulent secretions, ear ache,   fever, chills, sweats, unintended wt loss or wt gain, classically pleuritic or exertional cp,  orthopnea pnd or arm/hand swelling  or leg swelling, presyncope, palpitations, abdominal pain,  anorexia, nausea, vomiting, diarrhea  or change in bowel habits or change in bladder habits, change in stools or change in urine, dysuria, hematuria,  rash, arthralgias, visual complaints, headache, numbness, weakness or ataxia or problems with walking or coordination,  change in mood or  memory.        Current Meds  Medication Sig   albuterol  (PROVENTIL ) (2.5 MG/3ML) 0.083% nebulizer solution Take 2.5 mg by nebulization daily as needed for wheezing or shortness of breath. Shortness of breathe   albuterol  (VENTOLIN  HFA) 108 (90 Base) MCG/ACT inhaler Inhale 2 puffs into the lungs every 6 (six) hours as needed for wheezing or shortness of breath.   amitriptyline  (ELAVIL ) 100 MG tablet Take 100 mg by mouth at bedtime.   Budeson-Glycopyrrol-Formoterol (BREZTRI  AEROSPHERE)  160-9-4.8 MCG/ACT AERO Take 2 puffs first thing in am and then another 2 puffs about 12 hours later.   budeson-glycopyrrolate -formoterol (BREZTRI  AEROSPHERE) 160-9-4.8 MCG/ACT AERO Inhale 2 puffs into the lungs in the morning and at bedtime.   gabapentin  (NEURONTIN ) 300 MG capsule Take 300 mg by mouth at bedtime.   Glycerin-Hypromellose-PEG 400 (DRY EYE RELIEF DROPS) 0.2-0.2-1 % SOLN Place 1 drop into both eyes daily as needed (Dry eyes).   HYDROcodone -acetaminophen  (NORCO) 10-325 MG tablet Take 1 tablet by mouth every 6 (six) hours as needed for severe pain or moderate pain.   ketorolac  (ACULAR ) 0.5 % ophthalmic solution Place 1 drop into the right eye 4 (four) times daily.   linaclotide  (LINZESS ) 72 MCG capsule Take 72 mcg by mouth daily before breakfast.   Multiple Vitamins-Minerals (ONE-A-DAY MENS 50+ PO) Take 1 tablet by mouth daily.   nitroGLYCERIN  (NITROSTAT ) 0.4 MG SL tablet Place 0.4 mg under the tongue every 5 (five) minutes as needed for chest pain.    pantoprazole  (PROTONIX ) 40 MG tablet Take 1 tablet (40 mg total) by mouth daily before breakfast.   Plecanatide  (TRULANCE ) 3 MG TABS Take 1 tablet (3 mg total) by mouth daily.   pravastatin (PRAVACHOL) 40 MG tablet Take 40 mg by mouth at bedtime.   prednisoLONE acetate (PRED FORTE) 1 % ophthalmic suspension Place 1 drop into the right eye 4 (four) times daily.   QUEtiapine (SEROQUEL) 50 MG tablet Take 50 mg by mouth at bedtime.   SUMAtriptan (IMITREX) 100 MG tablet Take by mouth.   verapamil (CALAN-SR) 180 MG CR tablet Take 180 mg by mouth at bedtime. (Patient taking differently: Take 180 mg by mouth 3 (three) times daily.)   zolpidem (AMBIEN) 10 MG tablet Take 10 mg by mouth at bedtime.           Past Medical History:  Diagnosis Date   GERD (gastroesophageal reflux disease)    Headache(784.0)    MIGRAINE  --  LAST ONE ACOUPLE DAYS AGO   History of kidney stones yrs ago   Hypertension    Insomnia    Mild emphysema (HCC)     Neuromuscular disorder (HCC)    neuropathy improved since off depakote   PONV (postoperative nausea and vomiting)    Prostate disorder    enlarged   Shortness of breath    with over exertion      Objective:    Wts  01/22/2024       169   08/29/2023       175   07/10/23 174 lb 6.4 oz (79.1 kg)  05/22/23 173 lb (78.5 kg)  03/31/23 177 lb 3.2 oz (80.4 kg)    Vital signs reviewed  01/22/2024  -  Note at rest 02 sats  97% on RA   General appearance:    amb wm nad     HEENT : Oropharynx  clear/ full dentures   Nasal turbinates nl    NECK :  without  apparent JVD/ palpable Nodes/TM    LUNGS: no acc muscle use,  Min barrel  contour chest wall with bilateral  slightly decreased bs s audible wheeze and  without cough on insp or exp maneuvers and min  Hyperresonant  to  percussion bilaterally    CV:  RRR  no s3 or murmur or increase in P2, and no edema   ABD:  soft and nontender with pos end  insp Hoover's  in the supine position.  No bruits or organomegaly appreciated   MS:  Nl gait/ ext warm without deformities Or obvious joint restrictions  calf tenderness, cyanosis or clubbing     SKIN: warm and dry without lesions    NEURO:  alert, approp, nl sensorium with  no motor or cerebellar deficits apparent.                     Assessment   Assessment & Plan COPD GOLD 0/  AB Active smoker -  05/16/23  CT chest :  Centrilobular and paraseptal emphysema.   -  05/22/2023  After extensive coaching inhaler device,  effectiveness =    75% hfa so rx breztri  and change saba to prn  - PFT's  07/11/23  FEV1 2.83 (86 % ) ratio 0.77  p 0 % improvement from saba p nothing  prior to study with DLCO  26 (78%)   and FV curve nl curvature and ERV 33% at wt 175   - 08/29/2023  After extensive coaching inhaler device,  effectiveness =    75% hfa  His is an AB phenotype with emphysema on CT so perfect for Breztri  and no need for change with saba as prn: Re SABA :  I spent extra time with pt today  reviewing appropriate use of albuterol  for prn use on exertion with the following points: 1) saba is for relief of sob that does not improve by walking a slower pace or resting but rather if the pt does not improve after trying this first. 2) If the pt is convinced, as many are, that saba helps recover from activity faster then it's easy to tell if this is the case by re-challenging : ie stop, take the inhaler, then p 5 minutes try the exact same activity (intensity of workload) that just caused the symptoms and see if they are substantially diminished or not after saba 3) if there is an activity that reproducibly causes the symptoms, try the saba 15 min before the activity on alternate days   If in fact the saba really does help, then fine to continue to use it prn but advised may need to look closer at the maintenance regimen (in this case breztri  but one could argue ok to step down to symbicort if doing great)  being used to achieve better control of airways disease with exertion.    Cigarette smoker Active smoker  - Counseled re importance of smoking cessation but did not meet time criteria for separate billing    >>>  Low-dose CT lung cancer screening is recommended for patients who are 97-9 years of age with a 20+ pack-year history of smoking and who are currently smoking or quit <=15 years ago. No coughing up blood  No  unintentional weight loss of > 15 pounds in the last 6 months - pt is eligible for scanning yearly until age 75 > referred   Discussed in detail all the  indications, usual  risks and alternatives  relative to the benefits with patient who agrees to proceed with w/u as outlined.       ASD (atrial septal defect) Referred back to cardiology in [facility]  01/22/2024  Each maintenance medication was reviewed in detail including emphasizing most importantly the difference between maintenance and prns and under what circumstances the prns are to be triggered using an action plan  format where appropriate.  Total time for H and P, chart review, counseling, reviewing hfa device(s) and generating customized AVS unique to this office visit / same day charting = 30 min       Patient Instructions  My office will be contacting you by phone for referral to cardiology and Lung Cancer screening AT [phone]  - if you don't hear back from my office within one week please call us  back or notify us  thru MyChart and we'll address it right away.    The key is to stop smoking completely before smoking completely stops you!   Work on inhaler technique:  relax and gently blow all the way out then take a nice smooth full deep breath back in, triggering the inhaler at same time you start breathing in.  Hold breath in for at least  5 seconds if you can. Blow out breztri  thru nose. Rinse and gargle with water  when done.  If mouth or throat bother you at all,  try brushing teeth/gums/tongue with arm and hammer toothpaste/ make a slurry and gargle and spit out.    Please schedule a follow up visit in 6  months but call sooner if needed           Ozell America, MD 01/22/2024

## 2024-01-22 ENCOUNTER — Ambulatory Visit (INDEPENDENT_AMBULATORY_CARE_PROVIDER_SITE_OTHER): Admitting: Internal Medicine

## 2024-01-22 ENCOUNTER — Encounter: Payer: Self-pay | Admitting: Internal Medicine

## 2024-01-22 VITALS — BP 120/54 | HR 69 | Ht 71.0 in | Wt 169.8 lb

## 2024-01-22 DIAGNOSIS — J449 Chronic obstructive pulmonary disease, unspecified: Secondary | ICD-10-CM | POA: Diagnosis not present

## 2024-01-22 DIAGNOSIS — F17210 Nicotine dependence, cigarettes, uncomplicated: Secondary | ICD-10-CM

## 2024-01-22 DIAGNOSIS — Q211 Atrial septal defect, unspecified: Secondary | ICD-10-CM | POA: Diagnosis not present

## 2024-01-22 DIAGNOSIS — Z23 Encounter for immunization: Secondary | ICD-10-CM

## 2024-01-22 NOTE — Patient Instructions (Signed)
 My office will be contacting you by phone for referral to cardiology and Lung Cancer screening AT [phone]  - if you don't hear back from my office within one week please call us  back or notify us  thru MyChart and we'll address it right away.    The key is to stop smoking completely before smoking completely stops you!   Work on inhaler technique:  relax and gently blow all the way out then take a nice smooth full deep breath back in, triggering the inhaler at same time you start breathing in.  Hold breath in for at least  5 seconds if you can. Blow out breztri  thru nose. Rinse and gargle with water  when done.  If mouth or throat bother you at all,  try brushing teeth/gums/tongue with arm and hammer toothpaste/ make a slurry and gargle and spit out.    Please schedule a follow up visit in 6  months but call sooner if needed

## 2024-01-22 NOTE — Assessment & Plan Note (Addendum)
 Active smoker  - Counseled re importance of smoking cessation but did not meet time criteria for separate billing    >>>  Low-dose CT lung cancer screening is recommended for patients who are 42-74 years of age with a 20+ pack-year history of smoking and who are currently smoking or quit <=15 years ago. No coughing up blood  No unintentional weight loss of > 15 pounds in the last 6 months - pt is eligible for scanning yearly until age 54 > referred   Discussed in detail all the  indications, usual  risks and alternatives  relative to the benefits with patient who agrees to proceed with w/u as outlined.

## 2024-01-22 NOTE — Assessment & Plan Note (Addendum)
 Referred back to cardiology in [facility]  01/22/2024  Each maintenance medication was reviewed in detail including emphasizing most importantly the difference between maintenance and prns and under what circumstances the prns are to be triggered using an action plan format where appropriate.  Total time for H and P, chart review, counseling, reviewing hfa device(s) and generating customized AVS unique to this office visit / same day charting = 30 min

## 2024-01-22 NOTE — Assessment & Plan Note (Addendum)
 Active smoker -  05/16/23  CT chest :  Centrilobular and paraseptal emphysema.   -  05/22/2023  After extensive coaching inhaler device,  effectiveness =    75% hfa so rx breztri  and change saba to prn  - PFT's  07/11/23  FEV1 2.83 (86 % ) ratio 0.77  p 0 % improvement from saba p nothing  prior to study with DLCO  26 (78%)   and FV curve nl curvature and ERV 33% at wt 175   - 08/29/2023  After extensive coaching inhaler device,  effectiveness =    75% hfa  His is an AB phenotype with emphysema on CT so perfect for Breztri  and no need for change with saba as prn: Re SABA :  I spent extra time with pt today reviewing appropriate use of albuterol  for prn use on exertion with the following points: 1) saba is for relief of sob that does not improve by walking a slower pace or resting but rather if the pt does not improve after trying this first. 2) If the pt is convinced, as many are, that saba helps recover from activity faster then it's easy to tell if this is the case by re-challenging : ie stop, take the inhaler, then p 5 minutes try the exact same activity (intensity of workload) that just caused the symptoms and see if they are substantially diminished or not after saba 3) if there is an activity that reproducibly causes the symptoms, try the saba 15 min before the activity on alternate days   If in fact the saba really does help, then fine to continue to use it prn but advised may need to look closer at the maintenance regimen (in this case breztri  but one could argue ok to step down to symbicort if doing great)  being used to achieve better control of airways disease with exertion.

## 2024-01-30 ENCOUNTER — Ambulatory Visit: Admitting: Gastroenterology

## 2024-01-30 ENCOUNTER — Encounter: Payer: Self-pay | Admitting: Gastroenterology

## 2024-01-30 VITALS — BP 131/66 | HR 99 | Temp 98.1°F | Ht 71.0 in | Wt 174.2 lb

## 2024-01-30 DIAGNOSIS — Z800 Family history of malignant neoplasm of digestive organs: Secondary | ICD-10-CM | POA: Diagnosis not present

## 2024-01-30 DIAGNOSIS — Z860101 Personal history of adenomatous and serrated colon polyps: Secondary | ICD-10-CM

## 2024-01-30 DIAGNOSIS — K5909 Other constipation: Secondary | ICD-10-CM

## 2024-01-30 DIAGNOSIS — K219 Gastro-esophageal reflux disease without esophagitis: Secondary | ICD-10-CM | POA: Diagnosis not present

## 2024-01-30 DIAGNOSIS — K5900 Constipation, unspecified: Secondary | ICD-10-CM

## 2024-01-30 MED ORDER — PANTOPRAZOLE SODIUM 40 MG PO TBEC: 40.0000 mg | DELAYED_RELEASE_TABLET | Freq: Two times a day (BID) | ORAL | 1 refills | Status: DC

## 2024-01-30 NOTE — Progress Notes (Signed)
 GI Office Note    Referring Provider: Marvine Rush, MD Primary Care Physician:  Marvine Rush, MD  Primary Gastroenterologist: Ozell Hollingshead, MD   Chief Complaint   Chief Complaint  Patient presents with   Colonoscopy    History of Present Illness   Terry Morrow is a 73 y.o. male presenting today to schedule colonoscopy. Last seen 03/2023. History of constipation, GERD, colon polyps, FH of colon cancer (sister <52 yo).     Discussed the use of AI scribe software for clinical note transcription with the patient, who gave verbal consent to proceed.   He has a history of constipation and uses Linzess  intermittently, as taking it for two or three days results in significant diarrhea. He averages taking Linzess  less than once a week and supplements with fiber and probiotics. He has bowel movements approximately twice a week. No melena, brbpr.  He experiences acid reflux, particularly after breakfast, which he suspects may be related to coffee or bacon. He has been on pantoprazole  once daily for years, has also tried omeprazole  40mg  BID but continues to experience heartburn, especially in the mornings. He uses Tums frequently for temporary relief. Goes through bottles of Tums and provides short-lived relief. No pain or nausea, but he reports a burning sensation that sometimes comes up into his mouth. No dysphagia.  He has a history of atrial septal defect (ASD) and is scheduled to see a cardiologist on September 17th. He has not had a cardiology appointment in years (2015?), although he used to have annual check-ups with bubble tests and ultrasounds. The ASD has been present since birth and has not caused any problems.       Prior Data    Colonoscopy May 2022: - Nine 4 to 7 mm polyps in the descending colon and in the ascending colon, removed with a cold snare. Resected and retrieved. - The examination was otherwise normal on direct and retroflexion views. -Multiple tubular  adenomas on path -Surveillance colonoscopy recommended in 3 years   EGD 2011 -Empiric dilation for history of dysphagia -Hiatal hernia     Medications   Current Outpatient Medications  Medication Sig Dispense Refill   albuterol  (PROVENTIL ) (2.5 MG/3ML) 0.083% nebulizer solution Take 2.5 mg by nebulization daily as needed for wheezing or shortness of breath. Shortness of breathe     albuterol  (VENTOLIN  HFA) 108 (90 Base) MCG/ACT inhaler Inhale 2 puffs into the lungs every 6 (six) hours as needed for wheezing or shortness of breath.     amitriptyline  (ELAVIL ) 100 MG tablet Take 100 mg by mouth at bedtime.     budeson-glycopyrrolate -formoterol (BREZTRI  AEROSPHERE) 160-9-4.8 MCG/ACT AERO Inhale 2 puffs into the lungs in the morning and at bedtime. 1 each    gabapentin  (NEURONTIN ) 300 MG capsule Take 300 mg by mouth at bedtime.     Glycerin-Hypromellose-PEG 400 (DRY EYE RELIEF DROPS) 0.2-0.2-1 % SOLN Place 1 drop into both eyes daily as needed (Dry eyes).     HYDROcodone -acetaminophen  (NORCO) 10-325 MG tablet Take 1 tablet by mouth every 6 (six) hours as needed for severe pain or moderate pain.     linaclotide  (LINZESS ) 72 MCG capsule Take 72 mcg by mouth daily before breakfast.     Multiple Vitamins-Minerals (ONE-A-DAY MENS 50+ PO) Take 1 tablet by mouth daily.     nitroGLYCERIN  (NITROSTAT ) 0.4 MG SL tablet Place 0.4 mg under the tongue every 5 (five) minutes as needed for chest pain.      pantoprazole  (PROTONIX )  40 MG tablet Take 1 tablet (40 mg total) by mouth daily before breakfast. 90 tablet 3   pravastatin (PRAVACHOL) 40 MG tablet Take 40 mg by mouth at bedtime.     QUEtiapine (SEROQUEL) 50 MG tablet Take 50 mg by mouth at bedtime.     SUMAtriptan (IMITREX) 100 MG tablet Take by mouth.     verapamil (CALAN) 120 MG tablet Take 120 mg by mouth 3 (three) times daily.     zolpidem (AMBIEN) 10 MG tablet Take 10 mg by mouth at bedtime.     No current facility-administered medications for  this visit.    Allergies   Allergies as of 01/30/2024   (No Known Allergies)     Past Medical History   Past Medical History:  Diagnosis Date   ASD (atrial septal defect)    GERD (gastroesophageal reflux disease)    Headache(784.0)    MIGRAINE  --  LAST ONE ACOUPLE DAYS AGO   History of kidney stones yrs ago   Hypertension    Insomnia    Mild emphysema (HCC)    Neuromuscular disorder (HCC)    neuropathy improved since off depakote   PFO (patent foramen ovale)    PFO (patent foramen ovale) Ostium secundum type atrial septal defect   PONV (postoperative nausea and vomiting)    Prostate disorder    enlarged   Shortness of breath    with over exertion    Past Surgical History   Past Surgical History:  Procedure Laterality Date   BONE ANCHORED HEARING AID IMPLANT Left 11/15/2012   Procedure: LEFT BONE ANCHORED HEARING AID (BAHA) IMPLANT;  Surgeon: Ida Loader, MD;  Location: [facility];  Service: ENT;  Laterality: Left;   COLONOSCOPY WITH PROPOFOL  N/A 07/20/2017   Dr. Shaaron: Diverticulosis, 6 polyps removed, multiple tubular adenomas.  Surveillance exam advised in 3 years.   COLONOSCOPY WITH PROPOFOL  N/A 10/12/2020   Procedure: COLONOSCOPY WITH PROPOFOL ;  Surgeon: Shaaron Lamar HERO, MD;  [facility]: [facility];  Service: Endoscopy;  Laterality: N/A;  pm appt   CYSTECTOMY     larynx   CYSTOSCOPY WITH INSERTION OF UROLIFT N/A 01/15/2019   Procedure: CYSTOSCOPY WITH INSERTION OF UROLIFT;  Surgeon: Matilda Senior, MD;  Location: [facility];  Service: Urology;  Laterality: N/A;   dental implant surgery  10/02/2020   ELBOW SURGERY Bilateral    x 2 on both   ESOPHAGOGASTRODUODENOSCOPY  2011   Dr. Shaaron: Empiric dilation for history of dysphagia, hiatal hernia   EYE SURGERY     BIL  CATARACTS   INNER EAR SURGERY     x 2 on left   KNEE SURGERY Bilateral    x 2 both knees arthroscopic   MASTOIDECTOMY     left   NECK SURGERY     x 5 stiff at times limited neck rom   POLYPECTOMY   07/20/2017   Procedure: POLYPECTOMY;  Surgeon: Shaaron Lamar HERO, MD;  Location: [facility];  Service: Endoscopy;;  Ileo-cecal valve (HS) x 1; Hepatic flexure(HS) x1; Descending colon(HS) x2   POLYPECTOMY  10/12/2020   Procedure: POLYPECTOMY;  Surgeon: Shaaron Lamar HERO, MD;  Location: [facility];  Service: Endoscopy;;   ROTATOR CUFF REPAIR Right    WRIST SURGERY     left    Past Family History   Family History  Problem Relation Age of Onset   Colon cancer Sister        diagnosed at age <22 several years later [facility]  Colon cancer Maternal Grandfather     Past Social History   Social History   Socioeconomic History   Marital status: Married    Spouse name: Not on file   Number of children: Not on file   Years of education: Not on file   Highest education level: Not on file  Occupational History   Not on file  Tobacco Use   Smoking status: Every Day    Current packs/day: 1.00    Average packs/day: 1 pack/day for 38.0 years (38.0 ttl pk-yrs)    Types: Cigarettes   Smokeless tobacco: Never  Vaping Use   Vaping status: Never Used  Substance and Sexual Activity   Alcohol use: No   Drug use: No   Sexual activity: Yes    Birth control/protection: None  Other Topics Concern   Not on file  Social History Narrative   Not on file   Social Drivers of Health   Financial Resource Strain: Not on file  Food Insecurity: Not on file  Transportation Needs: Not on file  Physical Activity: Not on file  Stress: Not on file  Social Connections: Not on file  Intimate Partner Violence: Not on file    Review of Systems   General: Negative for anorexia, weight loss, fever, chills, fatigue, weakness. ENT: Negative for hoarseness, difficulty swallowing , nasal congestion. CV: Negative for chest pain, angina, palpitations, +dyspnea on exertion if he gets in a hurry. no peripheral edema.  Respiratory: Negative for dyspnea at rest,+ dyspnea on exertion if he gets in a hurry. +  cough, +sputum in mornings, no wheezing.  GI: See history of present illness. GU:  Negative for dysuria, hematuria, urinary incontinence, urinary frequency, nocturnal urination.  Endo: Negative for unusual weight change.     Physical Exam   BP 131/66 (BP Location: Right Arm, Patient Position: Sitting, Cuff Size: Normal)   Pulse 99   Temp 98.1 F (36.7 C) (Oral)   Ht 5' 11 (1.803 m)   Wt 174 lb 3.2 oz (79 kg)   SpO2 98%   BMI 24.30 kg/m    General: Well-nourished, well-developed in no acute distress.  Eyes: No icterus. Mouth: Oropharyngeal mucosa moist and pink   Lungs: Clear to auscultation bilaterally.  Heart: Regular rate and rhythm, no murmurs rubs or gallops.  Abdomen: Bowel sounds are normal, nontender, nondistended, no hepatosplenomegaly or masses,  no abdominal bruits or hernia , no rebound or guarding.  Rectal: not performed Extremities: No lower extremity edema. No clubbing or deformities. Neuro: Alert and oriented x 4   Skin: Warm and dry, no jaundice.   Psych: Alert and cooperative, normal mood and affect.  Labs   No recent labs  Imaging Studies   No results found.  Assessment/Plan:        Chronic constipation Chronic constipation with infrequent bowel movements, approximately twice a week. Linzess  is not regularly taken due to episodes of diarrhea following its use. Current management includes fiber supplements and probiotics. MiraLAX is recommended as it is less likely to cause  -Initiate MiraLAX, starting with two capfuls daily until achieving a satisfactory bowel movement, then reduce to one capful daily. - Instruct to contact the office if bowel movements do not improve with MiraLAX for potential adjustment of colonoscopy preparation.  History of adenomatous colon polyps/FH CRC: due for 3 year surveillance colonoscopy. -colonoscopy with Dr. Shaaron. ASA 3. Room 3.  I have discussed the risks, alternatives, benefits with regards to but not limited to the  risk of reaction to medication, bleeding, infection, perforation and the patient is agreeable to proceed. Written consent to be obtained.   Gastroesophageal reflux disease (GERD) GERD with persistent symptoms despite pantoprazole  once daily. Symptoms include heartburn, especially after breakfast, and frequent use of Tums. Symptoms suggest inadequate acid suppression overnight. No recent endoscopy; last performed 14 years ago. Given refractory symptoms, would advise endoscopic evaluation. -Increasing pantoprazole  to twice daily may improve symptom control by providing better overnight acid suppression. Take 40mg  before breakfast and evening meal. New RX provided.  -EGD at time of endoscopy. ASA 3.  I have discussed the risks, alternatives, benefits with regards to but not limited to the risk of reaction to medication, bleeding, infection, perforation and the patient is agreeable to proceed. Written consent to be obtained. -staff to reach out to endo to determine if cardiac evaluation will need to be completed prior to procedures given history of ASD, last evaluation around 2015.      Sonny RAMAN. Ezzard, MHS, PA-C [facility]

## 2024-01-30 NOTE — H&P (View-Only) (Signed)
 GI Office Note    Referring Provider: Marvine Rush, MD Primary Care Physician:  Marvine Rush, MD  Primary Gastroenterologist: Ozell Hollingshead, MD   Chief Complaint   Chief Complaint  Patient presents with   Colonoscopy    History of Present Illness   Terry Morrow is a 73 y.o. male presenting today to schedule colonoscopy. Last seen 03/2023. History of constipation, GERD, colon polyps, FH of colon cancer (sister <52 yo).     Discussed the use of AI scribe software for clinical note transcription with the patient, who gave verbal consent to proceed.   He has a history of constipation and uses Linzess  intermittently, as taking it for two or three days results in significant diarrhea. He averages taking Linzess  less than once a week and supplements with fiber and probiotics. He has bowel movements approximately twice a week. No melena, brbpr.  He experiences acid reflux, particularly after breakfast, which he suspects may be related to coffee or bacon. He has been on pantoprazole  once daily for years, has also tried omeprazole  40mg  BID but continues to experience heartburn, especially in the mornings. He uses Tums frequently for temporary relief. Goes through bottles of Tums and provides short-lived relief. No pain or nausea, but he reports a burning sensation that sometimes comes up into his mouth. No dysphagia.  He has a history of atrial septal defect (ASD) and is scheduled to see a cardiologist on September 17th. He has not had a cardiology appointment in years (2015?), although he used to have annual check-ups with bubble tests and ultrasounds. The ASD has been present since birth and has not caused any problems.       Prior Data    Colonoscopy May 2022: - Nine 4 to 7 mm polyps in the descending colon and in the ascending colon, removed with a cold snare. Resected and retrieved. - The examination was otherwise normal on direct and retroflexion views. -Multiple tubular  adenomas on path -Surveillance colonoscopy recommended in 3 years   EGD 2011 -Empiric dilation for history of dysphagia -Hiatal hernia     Medications   Current Outpatient Medications  Medication Sig Dispense Refill   albuterol  (PROVENTIL ) (2.5 MG/3ML) 0.083% nebulizer solution Take 2.5 mg by nebulization daily as needed for wheezing or shortness of breath. Shortness of breathe     albuterol  (VENTOLIN  HFA) 108 (90 Base) MCG/ACT inhaler Inhale 2 puffs into the lungs every 6 (six) hours as needed for wheezing or shortness of breath.     amitriptyline  (ELAVIL ) 100 MG tablet Take 100 mg by mouth at bedtime.     budeson-glycopyrrolate -formoterol (BREZTRI  AEROSPHERE) 160-9-4.8 MCG/ACT AERO Inhale 2 puffs into the lungs in the morning and at bedtime. 1 each    gabapentin  (NEURONTIN ) 300 MG capsule Take 300 mg by mouth at bedtime.     Glycerin-Hypromellose-PEG 400 (DRY EYE RELIEF DROPS) 0.2-0.2-1 % SOLN Place 1 drop into both eyes daily as needed (Dry eyes).     HYDROcodone -acetaminophen  (NORCO) 10-325 MG tablet Take 1 tablet by mouth every 6 (six) hours as needed for severe pain or moderate pain.     linaclotide  (LINZESS ) 72 MCG capsule Take 72 mcg by mouth daily before breakfast.     Multiple Vitamins-Minerals (ONE-A-DAY MENS 50+ PO) Take 1 tablet by mouth daily.     nitroGLYCERIN  (NITROSTAT ) 0.4 MG SL tablet Place 0.4 mg under the tongue every 5 (five) minutes as needed for chest pain.      pantoprazole  (PROTONIX )  40 MG tablet Take 1 tablet (40 mg total) by mouth daily before breakfast. 90 tablet 3   pravastatin (PRAVACHOL) 40 MG tablet Take 40 mg by mouth at bedtime.     QUEtiapine (SEROQUEL) 50 MG tablet Take 50 mg by mouth at bedtime.     SUMAtriptan (IMITREX) 100 MG tablet Take by mouth.     verapamil (CALAN) 120 MG tablet Take 120 mg by mouth 3 (three) times daily.     zolpidem (AMBIEN) 10 MG tablet Take 10 mg by mouth at bedtime.     No current facility-administered medications for  this visit.    Allergies   Allergies as of 01/30/2024   (No Known Allergies)     Past Medical History   Past Medical History:  Diagnosis Date   ASD (atrial septal defect)    GERD (gastroesophageal reflux disease)    Headache(784.0)    MIGRAINE  --  LAST ONE ACOUPLE DAYS AGO   History of kidney stones yrs ago   Hypertension    Insomnia    Mild emphysema (HCC)    Neuromuscular disorder (HCC)    neuropathy improved since off depakote   PFO (patent foramen ovale)    PFO (patent foramen ovale) Ostium secundum type atrial septal defect   PONV (postoperative nausea and vomiting)    Prostate disorder    enlarged   Shortness of breath    with over exertion    Past Surgical History   Past Surgical History:  Procedure Laterality Date   BONE ANCHORED HEARING AID IMPLANT Left 11/15/2012   Procedure: LEFT BONE ANCHORED HEARING AID (BAHA) IMPLANT;  Surgeon: Ida Loader, MD;  Location: [facility] OR;  Service: ENT;  Laterality: Left;   COLONOSCOPY WITH PROPOFOL  N/A 07/20/2017   Dr. Shaaron: Diverticulosis, 6 polyps removed, multiple tubular adenomas.  Surveillance exam advised in 3 years.   COLONOSCOPY WITH PROPOFOL  N/A 10/12/2020   Procedure: COLONOSCOPY WITH PROPOFOL ;  Surgeon: Shaaron Lamar HERO, MD;  [facility]: [facility];  Service: Endoscopy;  Laterality: N/A;  pm appt   CYSTECTOMY     larynx   CYSTOSCOPY WITH INSERTION OF UROLIFT N/A 01/15/2019   Procedure: CYSTOSCOPY WITH INSERTION OF UROLIFT;  Surgeon: Matilda Senior, MD;  Location: [facility];  Service: Urology;  Laterality: N/A;   dental implant surgery  10/02/2020   ELBOW SURGERY Bilateral    x 2 on both   ESOPHAGOGASTRODUODENOSCOPY  2011   Dr. Shaaron: Empiric dilation for history of dysphagia, hiatal hernia   EYE SURGERY     BIL  CATARACTS   INNER EAR SURGERY     x 2 on left   KNEE SURGERY Bilateral    x 2 both knees arthroscopic   MASTOIDECTOMY     left   NECK SURGERY     x 5 stiff at times limited neck rom   POLYPECTOMY   07/20/2017   Procedure: POLYPECTOMY;  Surgeon: Shaaron Lamar HERO, MD;  [facility]: [facility];  Service: Endoscopy;;  Ileo-cecal valve (HS) x 1; Hepatic flexure(HS) x1; Descending colon(HS) x2   POLYPECTOMY  10/12/2020   Procedure: POLYPECTOMY;  Surgeon: Shaaron Lamar HERO, MD;  [facility]: [facility];  Service: Endoscopy;;   ROTATOR CUFF REPAIR Right    WRIST SURGERY     left    Past Family History   Family History  Problem Relation Age of Onset   Colon cancer Sister        diagnosed at age <22 several years later [facility]  Colon cancer Maternal Grandfather     Past Social History   Social History   Socioeconomic History   Marital status: Married    Spouse name: Not on file   Number of children: Not on file   Years of education: Not on file   Highest education level: Not on file  Occupational History   Not on file  Tobacco Use   Smoking status: Every Day    Current packs/day: 1.00    Average packs/day: 1 pack/day for 38.0 years (38.0 ttl pk-yrs)    Types: Cigarettes   Smokeless tobacco: Never  Vaping Use   Vaping status: Never Used  Substance and Sexual Activity   Alcohol use: No   Drug use: No   Sexual activity: Yes    Birth control/protection: None  Other Topics Concern   Not on file  Social History Narrative   Not on file   Social Drivers of Health   Financial Resource Strain: Not on file  Food Insecurity: Not on file  Transportation Needs: Not on file  Physical Activity: Not on file  Stress: Not on file  Social Connections: Not on file  Intimate Partner Violence: Not on file    Review of Systems   General: Negative for anorexia, weight loss, fever, chills, fatigue, weakness. ENT: Negative for hoarseness, difficulty swallowing , nasal congestion. CV: Negative for chest pain, angina, palpitations, +dyspnea on exertion if he gets in a hurry. no peripheral edema.  Respiratory: Negative for dyspnea at rest,+ dyspnea on exertion if he gets in a hurry. +  cough, +sputum in mornings, no wheezing.  GI: See history of present illness. GU:  Negative for dysuria, hematuria, urinary incontinence, urinary frequency, nocturnal urination.  Endo: Negative for unusual weight change.     Physical Exam   BP 131/66 (BP Location: Right Arm, Patient Position: Sitting, Cuff Size: Normal)   Pulse 99   Temp 98.1 F (36.7 C) (Oral)   Ht 5' 11 (1.803 m)   Wt 174 lb 3.2 oz (79 kg)   SpO2 98%   BMI 24.30 kg/m    General: Well-nourished, well-developed in no acute distress.  Eyes: No icterus. Mouth: Oropharyngeal mucosa moist and pink   Lungs: Clear to auscultation bilaterally.  Heart: Regular rate and rhythm, no murmurs rubs or gallops.  Abdomen: Bowel sounds are normal, nontender, nondistended, no hepatosplenomegaly or masses,  no abdominal bruits or hernia , no rebound or guarding.  Rectal: not performed Extremities: No lower extremity edema. No clubbing or deformities. Neuro: Alert and oriented x 4   Skin: Warm and dry, no jaundice.   Psych: Alert and cooperative, normal mood and affect.  Labs   No recent labs  Imaging Studies   No results found.  Assessment/Plan:        Chronic constipation Chronic constipation with infrequent bowel movements, approximately twice a week. Linzess  is not regularly taken due to episodes of diarrhea following its use. Current management includes fiber supplements and probiotics. MiraLAX is recommended as it is less likely to cause  -Initiate MiraLAX, starting with two capfuls daily until achieving a satisfactory bowel movement, then reduce to one capful daily. - Instruct to contact the office if bowel movements do not improve with MiraLAX for potential adjustment of colonoscopy preparation.  History of adenomatous colon polyps/FH CRC: due for 3 year surveillance colonoscopy. -colonoscopy with Dr. Shaaron. ASA 3. Room 3.  I have discussed the risks, alternatives, benefits with regards to but not limited to the  risk of reaction to medication, bleeding, infection, perforation and the patient is agreeable to proceed. Written consent to be obtained.   Gastroesophageal reflux disease (GERD) GERD with persistent symptoms despite pantoprazole  once daily. Symptoms include heartburn, especially after breakfast, and frequent use of Tums. Symptoms suggest inadequate acid suppression overnight. No recent endoscopy; last performed 14 years ago. Given refractory symptoms, would advise endoscopic evaluation. -Increasing pantoprazole  to twice daily may improve symptom control by providing better overnight acid suppression. Take 40mg  before breakfast and evening meal. New RX provided.  -EGD at time of endoscopy. ASA 3.  I have discussed the risks, alternatives, benefits with regards to but not limited to the risk of reaction to medication, bleeding, infection, perforation and the patient is agreeable to proceed. Written consent to be obtained. -staff to reach out to endo to determine if cardiac evaluation will need to be completed prior to procedures given history of ASD, last evaluation around 2015.      Sonny RAMAN. Ezzard, MHS, PA-C [facility]

## 2024-01-30 NOTE — Patient Instructions (Signed)
 Increase pantoprazole  to 40mg  before breakfast and before evening meal.  Start miralax 17grams (one capful) twice daily or you can take two capfuls at one time UNTIL you have a soft stool. After that, you can drop down to one capful daily. Call if this does not adequately control your constipation..  We will work towards scheduling your colonoscopy and upper endoscopy in the near future. We may have to wait until cardiology has seen and evaluated you. We will let you know what anesthesiologist says.

## 2024-01-31 ENCOUNTER — Telehealth: Payer: Self-pay | Admitting: *Deleted

## 2024-01-31 NOTE — Telephone Encounter (Signed)
 Message sent to [facility] in Endo:  Provider wants to know if pt needs to have cardiac clearance prior to being scheduled for procedure. He has a h/o atrial septal defect since birth and has not been seen by cardiology since 2015.   Andrea KATHEE Fallow, RN sent message: Dr. Kendell, please advise.   Per Dr. Pati PARAS Kiel: no

## 2024-01-31 NOTE — Telephone Encounter (Signed)
 LMOVM to return call  TCS/EGD w/Dr.Rourk, asa 3

## 2024-02-01 ENCOUNTER — Encounter: Payer: Self-pay | Admitting: *Deleted

## 2024-02-01 ENCOUNTER — Other Ambulatory Visit: Payer: Self-pay | Admitting: *Deleted

## 2024-02-01 DIAGNOSIS — G44009 Cluster headache syndrome, unspecified, not intractable: Secondary | ICD-10-CM | POA: Diagnosis not present

## 2024-02-01 MED ORDER — PEG 3350-KCL-NA BICARB-NACL 420 G PO SOLR: 4000.0000 mL | Freq: Once | ORAL | 0 refills | Status: AC

## 2024-02-01 NOTE — Telephone Encounter (Signed)
 LMOVM to return call  Pt left vm returning call

## 2024-02-01 NOTE — Telephone Encounter (Signed)
 Pt has been scheduled for 02/28/24. Instructions mailed and prep sent to pharmacy.

## 2024-02-08 DIAGNOSIS — M542 Cervicalgia: Secondary | ICD-10-CM | POA: Diagnosis not present

## 2024-02-08 DIAGNOSIS — G44009 Cluster headache syndrome, unspecified, not intractable: Secondary | ICD-10-CM | POA: Diagnosis not present

## 2024-02-08 DIAGNOSIS — G518 Other disorders of facial nerve: Secondary | ICD-10-CM | POA: Diagnosis not present

## 2024-02-08 DIAGNOSIS — M791 Myalgia, unspecified site: Secondary | ICD-10-CM | POA: Diagnosis not present

## 2024-02-21 ENCOUNTER — Encounter: Payer: Self-pay | Admitting: Cardiology

## 2024-02-21 ENCOUNTER — Other Ambulatory Visit (HOSPITAL_COMMUNITY): Payer: Self-pay

## 2024-02-21 ENCOUNTER — Ambulatory Visit: Attending: Cardiology | Admitting: Cardiology

## 2024-02-21 VITALS — BP 122/60 | HR 92 | Ht 71.0 in | Wt 174.0 lb

## 2024-02-21 DIAGNOSIS — E782 Mixed hyperlipidemia: Secondary | ICD-10-CM | POA: Insufficient documentation

## 2024-02-21 DIAGNOSIS — Q2112 Patent foramen ovale: Secondary | ICD-10-CM

## 2024-02-21 DIAGNOSIS — F17210 Nicotine dependence, cigarettes, uncomplicated: Secondary | ICD-10-CM | POA: Diagnosis not present

## 2024-02-21 DIAGNOSIS — Z8673 Personal history of transient ischemic attack (TIA), and cerebral infarction without residual deficits: Secondary | ICD-10-CM | POA: Diagnosis not present

## 2024-02-21 LAB — LIPID PANEL

## 2024-02-21 MED ORDER — NICOTINE 14 MG/24HR TD PT24
14.0000 mg | MEDICATED_PATCH | Freq: Every day | TRANSDERMAL | 2 refills | Status: AC
  Filled 2024-02-21: qty 28, 28d supply, fill #0

## 2024-02-21 MED ORDER — ASPIRIN 81 MG PO TBEC
81.0000 mg | DELAYED_RELEASE_TABLET | Freq: Every day | ORAL | 3 refills | Status: AC
  Filled 2024-02-21: qty 90, 90d supply, fill #0

## 2024-02-21 NOTE — Patient Instructions (Addendum)
 3 mohtMedication Instructions:  START Aspirin  81 mg  START Nicotine  patch 14 mg   DECREASE Crestor to 20 mg daily   *If you need a refill on your cardiac medications before your next appointment, please call your pharmacy*  Lab Work in 4 weeks: CBC BMP LIPID PANEL   If you have labs (blood work) drawn today and your tests are completely normal, you will receive your results only by: MyChart Message (if you have MyChart) OR A paper copy in the mail If you have any lab test that is abnormal or we need to change your treatment, we will call you to review the results.  Testing/Procedures:  ECHOCARDIOGRAM with BUBBLE STUDY  Your physician has requested that you have an echocardiogram. Echocardiography is a painless test that uses sound waves to create images of your heart. It provides your doctor with information about the size and shape of your heart and how well your heart's chambers and valves are working. This procedure takes approximately one hour. There are no restrictions for this procedure. Please do NOT wear cologne, perfume, aftershave, or lotions (deodorant is allowed). Please arrive 15 minutes prior to your appointment time.  Please note: We ask at that you not bring children with you during ultrasound (echo/ vascular) testing. Due to room size and safety concerns, children are not allowed in the ultrasound rooms during exams. Our front office staff cannot provide observation of children in our lobby area while testing is being conducted. An adult accompanying a patient to their appointment will only be allowed in the ultrasound room at the discretion of the ultrasound technician under special circumstances. We apologize for any inconvenience.   Follow-Up: At [facility], you and your health needs are our priority.  As part of our continuing mission to provide you with exceptional heart care, our providers are all part of one team.  This team includes your primary  Cardiologist (physician) and Advanced Practice Providers or APPs (Physician Assistants and Nurse Practitioners) who all work together to provide you with the care you need, when you need it.  Your next appointment:   3 month(s)  Provider:   WITH APP   We recommend signing up for the patient portal called MyChart.  Sign up information is provided on this After Visit Summary.  MyChart is used to connect with patients for Virtual Visits (Telemedicine).  Patients are able to view lab/test results, encounter notes, upcoming appointments, etc.  Non-urgent messages can be sent to your provider as well.   To learn more about what you can do with MyChart, go to [email or web address].   Other Instructions

## 2024-02-21 NOTE — Progress Notes (Signed)
 Cardiology Office Note:  .   Date:  02/21/2024  ID:  Terry Morrow, DOB 04-10-1951, MRN [id number] PCP: Terry Rush, MD  Terry Morrow Cardiologist:  Terry Lawrence, MD PCP: Terry Rush, MD  Chief Complaint  Patient presents with   Atrial Septal Defect     Terry Morrow is a 73 y.o. male with COPD, smoker, h/o stroke, ?PFO vs ASD  Discussed the use of AI scribe software for clinical note transcription with the patient, who gave verbal consent to proceed.  History of Present Illness Terry Morrow is a 73 year old male who presents for evaluation of potential ASD or PFO. He was referred by Dr. Neomi for evaluation of potential ASD or PFO.  He has been informed of a potential atrial septal defect (ASD) or patent foramen ovale (PFO) and recalls undergoing annual ultrasounds and bubble tests for four years, though he has not had recent evaluations. Attempts to obtain records from [facility] were unsuccessful as the facility no longer exists.  He experienced a stroke at age 53 with severe pain, facial drooping, and inability to close one eye, treated with physical and electrical therapy. A recent MRI showed evidence of old strokes. He recalls an episode five to six years ago where he blacked out for over two hours, with no history of seizures.  He has COPD and experiences shortness of breath during physical activity. He smokes one pack of cigarettes per day and has unsuccessfully attempted to quit using patches and gum. He is currently taking 40 mg of pravastatin daily.      Vitals:   02/21/24 1054  BP: 122/60  Pulse: 92  SpO2: 95%      Review of Systems  Cardiovascular:  Positive for dyspnea on exertion. Negative for chest pain, leg swelling, palpitations and syncope.        Studies Reviewed: SABRA        EKG 02/21/2024: Normal sinus rhythm Normal ECG When compared with ECG of 08-Oct-2020 10:35, No significant change was  found  MRI brain 11/2023: 1.  No evidence of an acute intracranial abnormality. 2. Minor chronic small vessel ischemic changes within the cerebral white matter and pons, new from the prior MRI of 08/21/2012. 3. Small chronic infarct within the left cerebellar hemisphere, also new from the prior MRI.    Labs 11/2022: Chol 175, TG 149, HDL 50, LDL 99 Cr 1.1, K 4.8 TSH 1.6  2021: Hb 14.3   Physical Exam Vitals and nursing note reviewed.  Constitutional:      General: He is not in acute distress. Neck:     Vascular: No JVD.  Cardiovascular:     Rate and Rhythm: Normal rate and regular rhythm.     Heart sounds: Normal heart sounds. No murmur heard. Pulmonary:     Effort: Pulmonary effort is normal.     Breath sounds: Normal breath sounds. No wheezing or rales.  Musculoskeletal:     Right lower leg: No edema.     Left lower leg: No edema.      VISIT DIAGNOSES:   ICD-10-CM   1. PFO (patent foramen ovale)  Q21.12 EKG 12-Lead    CBC    Basic metabolic panel with GFR    Lipid panel    ECHOCARDIOGRAM COMPLETE BUBBLE STUDY    2. Mixed hyperlipidemia  E78.2 CBC    Basic metabolic panel with GFR    Lipid panel    3. H/O: stroke  Z86.73     4. Nicotine  dependence, cigarettes, uncomplicated  F17.210        Terry Morrow is a 73 y.o. male with hyperlipidemia, h/o stroke, COPD, ?ASD/PFO Assessment & Plan ASD/PFO: Question of ASD/PFO raised on prior noted, echocardiogram details not available to me. MRI brain 11/2023 showed chronic stroke, but unlikely to be due to PFO.  Ischemic stroke with chronic small vessel ischemic changes: Ischemic stroke with chronic small vessel ischemic changes on MRI 11/2023. Old infarct noted. No new strokes. Risk factors include hyperlipidemia and smoking. - Prescribe aspirin  81 mg daily. - Advise to avoid ibuprofen , BCS, and goodies for daily use due to increased bleeding risk with aspirin . - Change pravastatin to Crestor 20 mg to reduce  cholesterol below 70. - Check cholesterol, CBC, and BMP in 4 weeks. - Emphasize smoking cessation to reduce future stroke risk. [facility]  Mixed hyperlipidemia: Hyperlipidemia with inadequate pravastatin treatment. Goal to reduce cholesterol below 70 due to stroke history. - Change pravastatin to Crestor 20 mg. - Check cholesterol levels in 4 weeks.  Chronic obstructive pulmonary disease (COPD): COPD present but not severe. Smoking one pack per day contributes to COPD and health risks. - Recommend smoking cessation with nicotine  patches.      Meds ordered this encounter  Medications   aspirin  EC 81 MG tablet    Sig: Take 1 tablet (81 mg total) by mouth daily. Swallow whole.    Dispense:  90 tablet    Refill:  3   nicotine  (NICODERM CQ  - DOSED IN MG/24 HOURS) 14 mg/24hr patch    Sig: Place 1 patch (14 mg total) onto the skin daily.    Dispense:  28 patch    Refill:  2     F/u in 3 months  Signed, Terry Terry Lawrence, MD

## 2024-02-22 ENCOUNTER — Ambulatory Visit: Payer: Self-pay | Admitting: Cardiology

## 2024-02-22 DIAGNOSIS — Q2112 Patent foramen ovale: Secondary | ICD-10-CM

## 2024-02-22 LAB — CBC
Hematocrit: 39.6 % (ref 37.5–51.0)
Hemoglobin: 13.5 g/dL (ref 13.0–17.7)
MCH: 32.1 pg (ref 26.6–33.0)
MCHC: 34.1 g/dL (ref 31.5–35.7)
MCV: 94 fL (ref 79–97)
Platelets: 360 x10E3/uL (ref 150–450)
RBC: 4.2 x10E6/uL (ref 4.14–5.80)
RDW: 12.7 % (ref 11.6–15.4)
WBC: 7.4 x10E3/uL (ref 3.4–10.8)

## 2024-02-22 LAB — BASIC METABOLIC PANEL WITH GFR
BUN/Creatinine Ratio: 10 (ref 10–24)
BUN: 12 mg/dL (ref 8–27)
CO2: 20 mmol/L (ref 20–29)
Calcium: 8.9 mg/dL (ref 8.6–10.2)
Chloride: 105 mmol/L (ref 96–106)
Creatinine, Ser: 1.22 mg/dL (ref 0.76–1.27)
Glucose: 87 mg/dL (ref 70–99)
Potassium: 4 mmol/L (ref 3.5–5.2)
Sodium: 140 mmol/L (ref 134–144)
eGFR: 62 mL/min/1.73 (ref >59)

## 2024-02-22 LAB — LIPID PANEL
Cholesterol, Total: 149 mg/dL (ref 100–199)
HDL: 45 mg/dL (ref >39)
LDL CALC COMMENT:: 3.3 ratio (ref 0.0–5.0)
LDL Chol Calc (NIH): 82 mg/dL (ref 0–99)
Triglycerides: 125 mg/dL (ref 0–149)
VLDL Cholesterol Cal: 22 mg/dL (ref 5–40)

## 2024-02-23 ENCOUNTER — Encounter (HOSPITAL_COMMUNITY): Payer: Self-pay

## 2024-02-23 ENCOUNTER — Other Ambulatory Visit: Payer: Self-pay

## 2024-02-23 ENCOUNTER — Encounter: Payer: Self-pay | Admitting: *Deleted

## 2024-02-23 ENCOUNTER — Encounter (HOSPITAL_COMMUNITY)
Admission: RE | Admit: 2024-02-23 | Discharge: 2024-02-23 | Disposition: A | Discharge status: Home / Self Care | Source: Ambulatory Visit | Attending: Internal Medicine | Admitting: Internal Medicine

## 2024-02-28 ENCOUNTER — Ambulatory Visit (HOSPITAL_COMMUNITY)
Admission: RE | Admit: 2024-02-28 | Discharge: 2024-02-28 | Disposition: A | Discharge status: Home / Self Care | Attending: Internal Medicine | Admitting: Internal Medicine

## 2024-02-28 ENCOUNTER — Ambulatory Visit (HOSPITAL_COMMUNITY): Payer: Self-pay | Admitting: Certified Registered Nurse Anesthetist

## 2024-02-28 ENCOUNTER — Encounter (HOSPITAL_COMMUNITY): Payer: Self-pay | Admitting: Internal Medicine

## 2024-02-28 ENCOUNTER — Telehealth: Payer: Self-pay

## 2024-02-28 ENCOUNTER — Encounter (HOSPITAL_COMMUNITY)
Admission: RE | Disposition: A | Discharge status: Home / Self Care | Payer: Self-pay | Source: Home / Self Care | Attending: Internal Medicine

## 2024-02-28 ENCOUNTER — Ambulatory Visit (HOSPITAL_BASED_OUTPATIENT_CLINIC_OR_DEPARTMENT_OTHER): Payer: Self-pay | Admitting: Certified Registered Nurse Anesthetist

## 2024-02-28 ENCOUNTER — Other Ambulatory Visit: Payer: Self-pay

## 2024-02-28 DIAGNOSIS — R12 Heartburn: Secondary | ICD-10-CM | POA: Insufficient documentation

## 2024-02-28 DIAGNOSIS — K219 Gastro-esophageal reflux disease without esophagitis: Secondary | ICD-10-CM | POA: Diagnosis not present

## 2024-02-28 DIAGNOSIS — D123 Benign neoplasm of transverse colon: Secondary | ICD-10-CM | POA: Insufficient documentation

## 2024-02-28 DIAGNOSIS — Z800 Family history of malignant neoplasm of digestive organs: Secondary | ICD-10-CM

## 2024-02-28 DIAGNOSIS — Z09 Encounter for follow-up examination after completed treatment for conditions other than malignant neoplasm: Secondary | ICD-10-CM | POA: Diagnosis not present

## 2024-02-28 DIAGNOSIS — Z1211 Encounter for screening for malignant neoplasm of colon: Secondary | ICD-10-CM | POA: Diagnosis not present

## 2024-02-28 DIAGNOSIS — Z860100 Personal history of colon polyps, unspecified: Secondary | ICD-10-CM

## 2024-02-28 DIAGNOSIS — K589 Irritable bowel syndrome without diarrhea: Secondary | ICD-10-CM

## 2024-02-28 DIAGNOSIS — D124 Benign neoplasm of descending colon: Secondary | ICD-10-CM | POA: Insufficient documentation

## 2024-02-28 DIAGNOSIS — J449 Chronic obstructive pulmonary disease, unspecified: Secondary | ICD-10-CM

## 2024-02-28 DIAGNOSIS — F17200 Nicotine dependence, unspecified, uncomplicated: Secondary | ICD-10-CM | POA: Insufficient documentation

## 2024-02-28 DIAGNOSIS — I10 Essential (primary) hypertension: Secondary | ICD-10-CM

## 2024-02-28 DIAGNOSIS — K635 Polyp of colon: Secondary | ICD-10-CM | POA: Insufficient documentation

## 2024-02-28 DIAGNOSIS — K2100 Gastro-esophageal reflux disease with esophagitis, without bleeding: Secondary | ICD-10-CM | POA: Insufficient documentation

## 2024-02-28 DIAGNOSIS — J439 Emphysema, unspecified: Secondary | ICD-10-CM | POA: Diagnosis not present

## 2024-02-28 DIAGNOSIS — Z860101 Personal history of adenomatous and serrated colon polyps: Secondary | ICD-10-CM

## 2024-02-28 HISTORY — PX: ESOPHAGOGASTRODUODENOSCOPY: SHX5428

## 2024-02-28 HISTORY — PX: COLONOSCOPY: SHX5424

## 2024-02-28 SURGERY — COLONOSCOPY
Anesthesia: General

## 2024-02-28 MED ORDER — GLUCAGON HCL RDNA (DIAGNOSTIC) 1 MG IJ SOLR
INTRAMUSCULAR | Status: AC
  Filled 2024-02-28: qty 1

## 2024-02-28 MED ORDER — GLUCAGON HCL RDNA (DIAGNOSTIC) 1 MG IJ SOLR
INTRAMUSCULAR | Status: DC | PRN
  Administered 2024-02-28: 1 mg via INTRAVENOUS

## 2024-02-28 MED ORDER — DEXLANSOPRAZOLE 60 MG PO CPDR: 60.0000 mg | DELAYED_RELEASE_CAPSULE | Freq: Every day | ORAL | 11 refills | Status: DC

## 2024-02-28 MED ORDER — PHENYLEPHRINE 80 MCG/ML (10ML) SYRINGE FOR IV PUSH (FOR BLOOD PRESSURE SUPPORT)
PREFILLED_SYRINGE | INTRAVENOUS | Status: AC
  Filled 2024-02-28: qty 10

## 2024-02-28 MED ORDER — PROPOFOL 500 MG/50ML IV EMUL
INTRAVENOUS | Status: AC
  Filled 2024-02-28: qty 50

## 2024-02-28 MED ORDER — LACTATED RINGERS IV SOLN: INTRAVENOUS | Status: DC | PRN

## 2024-02-28 MED ORDER — LIDOCAINE 2% (20 MG/ML) 5 ML SYRINGE
INTRAMUSCULAR | Status: DC | PRN
  Administered 2024-02-28: 50 mg via INTRAVENOUS

## 2024-02-28 MED ORDER — PROPOFOL 500 MG/50ML IV EMUL
INTRAVENOUS | Status: DC | PRN
  Administered 2024-02-28: 150 ug/kg/min via INTRAVENOUS
  Administered 2024-02-28: 60 mg via INTRAVENOUS

## 2024-02-28 MED ORDER — PHENYLEPHRINE 80 MCG/ML (10ML) SYRINGE FOR IV PUSH (FOR BLOOD PRESSURE SUPPORT)
PREFILLED_SYRINGE | INTRAVENOUS | Status: DC | PRN
Start: 2024-02-28 — End: 2024-02-28
  Administered 2024-02-28 (×3): 160 ug via INTRAVENOUS
  Administered 2024-02-28: 80 ug via INTRAVENOUS
  Administered 2024-02-28: 160 ug via INTRAVENOUS
  Administered 2024-02-28 (×2): 80 ug via INTRAVENOUS
  Administered 2024-02-28 (×2): 160 ug via INTRAVENOUS

## 2024-02-28 NOTE — Interval H&P Note (Signed)
 History and Physical Interval Note:  02/28/2024 11:17 AM  Terry Morrow  has presented today for surgery, with the diagnosis of history of colon polyps, FHX:CRC,GERD.  The various methods of treatment have been discussed with the patient and family. After consideration of risks, benefits and other options for treatment, the patient has consented to  Procedure(s) with comments: COLONOSCOPY (N/A) - 1:00 pm, asa 3, LM to see if pt can come earlier EGD (ESOPHAGOGASTRODUODENOSCOPY) (N/A) as a surgical intervention.  The patient's history has been reviewed, patient examined, no change in status, stable for surgery.  I have reviewed the patient's chart and labs.  Questions were answered to the patient's satisfaction.     Terry Morrow   no change.  Twice daily Protonix  helps some takes his evening dose at bedtime not before his evening meal.  Denies dysphagia.  Agree with need for EGD and surveillance colonoscopy per plan.  The risks, benefits, limitations, imponderables and alternatives regarding both EGD and colonoscopy have been reviewed with the patient. Questions have been answered. All parties agreeable.

## 2024-02-28 NOTE — Discharge Instructions (Addendum)
 EGD Discharge instructions Please read the instructions outlined below and refer to this sheet in the next few weeks. These discharge instructions provide you with general information on caring for yourself after you leave the hospital. Your doctor may also give you specific instructions. While your treatment has been planned according to the most current medical practices available, unavoidable complications occasionally occur. If you have any problems or questions after discharge, please call your doctor. ACTIVITY You may resume your regular activity but move at a slower pace for the next 24 hours.  Take frequent rest periods for the next 24 hours.  Walking will help expel (get rid of) the air and reduce the bloated feeling in your abdomen.  No driving for 24 hours (because of the anesthesia (medicine) used during the test).  You may shower.  Do not sign any important legal documents or operate any machinery for 24 hours (because of the anesthesia used during the test).  NUTRITION Drink plenty of fluids.  You may resume your normal diet.  Begin with a light meal and progress to your normal diet.  Avoid alcoholic beverages for 24 hours or as instructed by your caregiver.  MEDICATIONS You may resume your normal medications unless your caregiver tells you otherwise.  WHAT YOU CAN EXPECT TODAY You may experience abdominal discomfort such as a feeling of fullness or "gas" pains.  FOLLOW-UP Your doctor will discuss the results of your test with you.  SEEK IMMEDIATE MEDICAL ATTENTION IF ANY OF THE FOLLOWING OCCUR: Excessive nausea (feeling sick to your stomach) and/or vomiting.  Severe abdominal pain and distention (swelling).  Trouble swallowing.  Temperature over 101 F (37.8 C).  Rectal bleeding or vomiting of blood.     Colonoscopy Discharge Instructions  Read the instructions outlined below and refer to this sheet in the next few weeks. These discharge instructions provide you with  general information on caring for yourself after you leave the hospital. Your doctor may also give you specific instructions. While your treatment has been planned according to the most current medical practices available, unavoidable complications occasionally occur. If you have any problems or questions after discharge, call Dr. Shaaron at [phone]. ACTIVITY You may resume your regular activity, but move at a slower pace for the next 24 hours.  Take frequent rest periods for the next 24 hours.  Walking will help get rid of the air and reduce the bloated feeling in your belly (abdomen).  No driving for 24 hours (because of the medicine (anesthesia) used during the test).   Do not sign any important legal documents or operate any machinery for 24 hours (because of the anesthesia used during the test).  NUTRITION Drink plenty of fluids.  You may resume your normal diet as instructed by your doctor.  Begin with a light meal and progress to your normal diet. Heavy or fried foods are harder to digest and may make you feel sick to your stomach (nauseated).  Avoid alcoholic beverages for 24 hours or as instructed.  MEDICATIONS You may resume your normal medications unless your doctor tells you otherwise.  WHAT YOU CAN EXPECT TODAY Some feelings of bloating in the abdomen.  Passage of more gas than usual.  Spotting of blood in your stool or on the toilet paper.  IF YOU HAD POLYPS REMOVED DURING THE COLONOSCOPY: No aspirin  products for 7 days or as instructed.  No alcohol for 7 days or as instructed.  Eat a soft diet for the next 24 hours.  FINDING OUT THE RESULTS OF YOUR TEST Not all test results are available during your visit. If your test results are not back during the visit, make an appointment with your caregiver to find out the results. Do not assume everything is normal if you have not heard from your caregiver or the medical facility. It is important for you to follow up on all of your test  results.  SEEK IMMEDIATE MEDICAL ATTENTION IF: You have more than a spotting of blood in your stool.  Your belly is swollen (abdominal distention).  You are nauseated or vomiting.  You have a temperature over 101.  You have abdominal pain or discomfort that is severe or gets worse throughout the day.      You have acid burns in your esophagus.  You need different medication for acid reflux   multiple polyps found in your colon and removed today   stop Protonix ; begin Dexilant  60 mg 1 capsule daily 30 minutes before breakfast.  A new prescription has been sent to your pharmacy from my office   further recommendations to follow pending review of pathology report   office visit with us  in 3 months   at patient request, I called Terry Morrow at [phone] -  reviewed findings and recommendations

## 2024-02-28 NOTE — Telephone Encounter (Signed)
 Rx sent to pharmacy on file.

## 2024-02-28 NOTE — Telephone Encounter (Signed)
-----   Message from Lamar Hollingshead sent at 02/28/2024 12:18 PM EDT -----  stop Protonix .  New prescription for Dexilant  60 mg capsule.  Dispense 30 with 11 refills.  Take 1 daily 30 minutes before breakfast.  Thanks.

## 2024-02-28 NOTE — Transfer of Care (Signed)
 Immediate Anesthesia Transfer of Care Note  Patient: Terry Morrow  Procedure(s) Performed: COLONOSCOPY EGD (ESOPHAGOGASTRODUODENOSCOPY)  Patient Location: Short Stay  Anesthesia Type:General  Level of Consciousness: drowsy, patient cooperative, and responds to stimulation  Airway & Oxygen Therapy: Patient Spontanous Breathing  Post-op Assessment: Report given to RN, Post -op Vital signs reviewed and stable, Patient moving all extremities X 4, and Patient able to stick tongue midline  Post vital signs: Reviewed and stable  Last Vitals:  Vitals Value Taken Time  BP 88/43 02/28/24 12:15  Temp 36.6 C 02/28/24 12:15  Pulse 67 02/28/24 12:15  Resp 14 02/28/24 12:15  SpO2 98 % 02/28/24 12:15    Last Pain:  Vitals:   02/28/24 1215  TempSrc: Oral  PainSc: 0-No pain      Patients Stated Pain Goal: 4 (02/28/24 1103)  Complications: No notable events documented.

## 2024-02-28 NOTE — Anesthesia Preprocedure Evaluation (Signed)
 Anesthesia Evaluation  Patient identified by MRN, date of birth, ID band Patient awake    Reviewed: Allergy & Precautions, H&P , NPO status , Patient's Chart, lab work & pertinent test results, reviewed documented beta blocker date and time   History of Anesthesia Complications (+) PONV and history of anesthetic complications  Airway Mallampati: II  TM Distance: >3 FB Neck ROM: full    Dental no notable dental hx.    Pulmonary neg pulmonary ROS, shortness of breath, COPD, Current Smoker   Pulmonary exam normal breath sounds clear to auscultation       Cardiovascular Exercise Tolerance: Good hypertension, negative cardio ROS  Rhythm:regular Rate:Normal     Neuro/Psych  Headaches PSYCHIATRIC DISORDERS       Neuromuscular disease negative neurological ROS  negative psych ROS   GI/Hepatic negative GI ROS, Neg liver ROS,GERD  ,,  Endo/Other  negative endocrine ROS    Renal/GU negative Renal ROS  negative genitourinary   Musculoskeletal   Abdominal   Peds  Hematology negative hematology ROS (+)   Anesthesia Other Findings   Reproductive/Obstetrics negative OB ROS                              Anesthesia Physical Anesthesia Plan  ASA: 3  Anesthesia Plan: General   Post-op Pain Management:    Induction:   PONV Risk Score and Plan: Propofol  infusion  Airway Management Planned:   Additional Equipment:   Intra-op Plan:   Post-operative Plan:   Informed Consent: I have reviewed the patients History and Physical, chart, labs and discussed the procedure including the risks, benefits and alternatives for the proposed anesthesia with the patient or authorized representative who has indicated his/her understanding and acceptance.     Dental Advisory Given  Plan Discussed with: CRNA  Anesthesia Plan Comments:         Anesthesia Quick Evaluation

## 2024-02-28 NOTE — Op Note (Signed)
 [facility] Patient Name: Micholas Drumwright Procedure Date: 02/28/2024 10:52 AM MRN: [id number] Date of Birth: 1951-03-11 Attending MD: Lamar Ozell Hollingshead , MD, [id number] CSN: [id number] Age: 73 Admit Type: Outpatient Procedure:                Colonoscopy Indications:              High risk colon cancer surveillance: Personal                            history of colonic polyps Providers:                Lamar Ozell Hollingshead, MD, Jon LABOR. Gerome RN, RN,                            Dorcas Lenis, Technician Referring MD:              Medicines:                Propofol  per Anesthesia Complications:            No immediate complications. Estimated Blood Loss:     Estimated blood loss was minimal. Procedure:                Pre-Anesthesia Assessment:                           - Prior to the procedure, a History and Physical                            was performed, and patient medications and                            allergies were reviewed. The patient's tolerance of                            previous anesthesia was also reviewed. The risks                            and benefits of the procedure and the sedation                            options and risks were discussed with the patient.                            All questions were answered, and informed consent                            was obtained. Prior Anticoagulants: The patient has                            taken no anticoagulant or antiplatelet agents. ASA                            Grade Assessment: III - A patient with severe  systemic disease. After reviewing the risks and                            benefits, the patient was deemed in satisfactory                            condition to undergo the procedure.                           After obtaining informed consent, the colonoscope                            was passed under direct vision. Throughout the                             procedure, the patient's blood pressure, pulse, and                            oxygen saturations were monitored continuously. The                            [id number] ([id number]) Colon was introduced through                            the anus and advanced to the the cecum, identified                            by appendiceal orifice and ileocecal valve. The                            entire colon was well visualized. Scope In: 11:24:03 AM Scope Out: 12:07:57 PM Scope Withdrawal Time: 0 hours 13 minutes 9 seconds  Total Procedure Duration: 0 hours 43 minutes 54 seconds  Findings:      The perianal and digital rectal examinations were normal. Spastic colon       1 mg glucagon  given. That reduced motility but still with significant       peristalsis which made the exam somewhat more difficult.      Six semi-pedunculated polyps were found in the sigmoid colon and hepatic       flexure. The polyps were 4 to 6 mm in size. These polyps were removed       with a cold snare. Resection and retrieval were complete. Estimated       blood loss: none.      Three semi-pedunculated polyps were found in the descending colon. The       polyps were 7 to 8 mm in size. These polyps were removed with a hot       snare. Resection and retrieval were complete. Estimated blood loss was       minimal.      The exam was otherwise without abnormality on direct and retroflexion       views. Impression:               - Six 4 to 6 mm polyps in the sigmoid colon and at  the hepatic flexure, removed with a cold snare.                            Resected and retrieved.                           - Three 7 to 8 mm polyps in the descending colon,                            removed with a hot snare. Resected and retrieved.                           - The examination was otherwise normal on direct                            and retroflexion views. Moderate Sedation:      Moderate (conscious)  sedation was personally administered by an       anesthesia professional. The following parameters were monitored: oxygen       saturation, heart rate, blood pressure, respiratory rate, EKG, adequacy       of pulmonary ventilation, and response to care. Recommendation:           - Patient has a contact number available for                            emergencies. The signs and symptoms of potential                            delayed complications were discussed with the                            patient. Return to normal activities tomorrow.                            Written discharge instructions were provided to the                            patient.                           - Advance diet as tolerated.                           - Continue present medications.                           - Repeat colonoscopy date to be determined after                            pending pathology results are reviewed for                            surveillance.                           - Return to GI office  in 3 months. See EGD report. Procedure Code(s):        --- Professional ---                           [phone], Colonoscopy, flexible; with removal of                            tumor(s), polyp(s), or other lesion(s) by snare                            technique Diagnosis Code(s):        --- Professional ---                           Z86.010, Personal history of colonic polyps                           D12.5, Benign neoplasm of sigmoid colon                           D12.3, Benign neoplasm of transverse colon (hepatic                            flexure or splenic flexure)                           D12.4, Benign neoplasm of descending colon CPT copyright 2022 [employer]. All rights reserved. The codes documented in this report are preliminary and upon coder review may  be revised to meet current compliance requirements. Lamar HERO. Natania Finigan, MD Lamar Ozell Hollingshead, MD 02/28/2024  12:15:11 PM This report has been signed electronically. Number of Addenda: 0

## 2024-02-29 ENCOUNTER — Ambulatory Visit: Payer: Self-pay | Admitting: Internal Medicine

## 2024-02-29 ENCOUNTER — Telehealth: Payer: Self-pay | Admitting: Cardiology

## 2024-02-29 LAB — SURGICAL PATHOLOGY

## 2024-02-29 MED ORDER — ROSUVASTATIN CALCIUM 20 MG PO TABS: 20.0000 mg | ORAL_TABLET | Freq: Every day | ORAL | 3 refills | Status: AC

## 2024-02-29 NOTE — Telephone Encounter (Signed)
 From 02/21/24 MD note:  Mixed hyperlipidemia: Hyperlipidemia with inadequate pravastatin treatment. Goal to reduce cholesterol below 70 due to stroke history. - Change pravastatin to Crestor  20 mg. - Check cholesterol levels in 4 weeks.  Rx for rosuvastatin  20mg  sent to [facility].  Reminded him of fasting labs due 1 month after med change.  He would like Rx for Wellbutrin  for smoking cessation. Will defer to MD

## 2024-02-29 NOTE — Telephone Encounter (Signed)
 Pt c/o medication issue:  1. Name of Medication:     pravastatin (PRAVACHOL) 40 MG tablet  Crestor   2. How are you currently taking this medication (dosage and times per day)?   3. Are you having a reaction (difficulty breathing--STAT)?   4. What is your medication issue?   Patient called to clarify he is on Pravastatin not Crestor  as was written in his After Visit Summary.  Patient wants to know if his Pravastatin is to be increased and wants a call back to confirm his medication changes.  Patient stated is a smoker and he also wants to get a prescription for Wellbutrin .

## 2024-02-29 NOTE — Anesthesia Postprocedure Evaluation (Signed)
 Anesthesia Post Note  Patient: Terry Morrow  Procedure(s) Performed: COLONOSCOPY EGD (ESOPHAGOGASTRODUODENOSCOPY)  Patient location during evaluation: Phase II Anesthesia Type: General Level of consciousness: awake Pain management: pain level controlled Vital Signs Assessment: post-procedure vital signs reviewed and stable Respiratory status: spontaneous breathing and respiratory function stable Cardiovascular status: blood pressure returned to baseline and stable Postop Assessment: no headache and no apparent nausea or vomiting Anesthetic complications: no Comments: Late entry   No notable events documented.   Last Vitals:  Vitals:   02/28/24 1227 02/28/24 1230  BP: (!) 101/56 109/63  Pulse:    Resp:    Temp:    SpO2:      Last Pain:  Vitals:   02/29/24 1522  TempSrc:   PainSc: 0-No pain                 Yvonna JINNY Bosworth

## 2024-02-29 NOTE — Op Note (Signed)
 [facility] Patient Name: Terry Morrow Procedure Date: 02/28/2024 10:53 AM MRN: [id number] Date of Birth: [birth year] Attending MD: Lamar Ozell Hollingshead , MD, [id number] CSN: [id number] Age: 73 Admit Type: Outpatient Procedure:                Upper GI endoscopy Indications:              Heartburn Providers:                Lamar Ozell Hollingshead, MD, Jon LABOR. Gerome RN, RN,                            Dorcas Lenis, Technician Referring MD:              Medicines:                Propofol  per Anesthesia Complications:            No immediate complications. Estimated Blood Loss:     Estimated blood loss: none. Procedure:                Pre-Anesthesia Assessment:                           - Prior to the procedure, a History and Physical                            was performed, and patient medications and                            allergies were reviewed. The patient's tolerance of                            previous anesthesia was also reviewed. The risks                            and benefits of the procedure and the sedation                            options and risks were discussed with the patient.                            All questions were answered, and informed consent                            was obtained. Prior Anticoagulants: The patient has                            taken no anticoagulant or antiplatelet agents. ASA                            Grade Assessment: III - A patient with severe                            systemic disease. After reviewing the risks and  benefits, the patient was deemed in satisfactory                            condition to undergo the procedure.                           After obtaining informed consent, the endoscope was                            passed under direct vision. Throughout the                            procedure, the patient's blood pressure, pulse, and                            oxygen  saturations were monitored continuously. The                            [id number] ([id number]) Upper was introduced through                            the mouth, and advanced to the second part of                            duodenum. The upper GI endoscopy was accomplished                            without difficulty. The patient tolerated the                            procedure well. Scope In: 11:24:59 AM Scope Out: 11:27:43 AM Total Procedure Duration: 0 hours 2 minutes 44 seconds  Findings:      Multiple dispersed distal esophageal erosions. Linear in configuration,       skipped coming up 10 cm from the EG junction. No Barrett's epithelium       seen. Tubular esophagus appeared normal otherwise.      The duodenal bulb and second portion of the duodenum were normal.      The entire examined stomach was normal. Impression:               - Moderately severe erosive reflux esophagitis-                           - Normal stomach. D1 and D2                           - No specimens collected. Moderate Sedation:      Moderate (conscious) sedation was personally administered by an       anesthesia professional. The following parameters were monitored: oxygen       saturation, heart rate, blood pressure, respiratory rate, EKG, adequacy       of pulmonary ventilation, and response to care. Recommendation:           - Patient has a contact number available for  emergencies. The signs and symptoms of potential                            delayed complications were discussed with the                            patient. Return to normal activities tomorrow.                            Written discharge instructions were provided to the                            patient.                           - Advance diet as tolerated. Stop Protonix  begin                            Dexilant  60 mg daily 30 minutes before breakfast.                            See colonoscopy report                            Office visit with us  in 3 months. Procedure Code(s):        --- Professional ---                           [phone], Esophagogastroduodenoscopy, flexible,                            transoral; diagnostic, including collection of                            specimen(s) by brushing or washing, when performed                            (separate procedure) Diagnosis Code(s):        --- Professional ---                           R12, Heartburn CPT copyright 2022 [employer] Association. All rights reserved. The codes documented in this report are preliminary and upon coder review may  be revised to meet current compliance requirements. Lamar HERO. Jaylun Fleener, MD Lamar Ozell Hollingshead, MD 02/29/2024 10:35:59 AM This report has been signed electronically. Number of Addenda: 0

## 2024-03-01 ENCOUNTER — Encounter (HOSPITAL_COMMUNITY): Payer: Self-pay | Admitting: Internal Medicine

## 2024-03-01 NOTE — Telephone Encounter (Signed)
 Okay with me.  Please send Welbutrin 100 mg twice daily.  Thanks MJP

## 2024-03-03 DIAGNOSIS — G44009 Cluster headache syndrome, unspecified, not intractable: Secondary | ICD-10-CM | POA: Diagnosis not present

## 2024-03-04 ENCOUNTER — Telehealth: Payer: Self-pay

## 2024-03-04 MED ORDER — BUPROPION HCL ER (SR) 100 MG PO TB12: 100.0000 mg | ORAL_TABLET | Freq: Two times a day (BID) | ORAL | 3 refills | Status: AC

## 2024-03-04 NOTE — Telephone Encounter (Signed)
 Prescription sent in and left message advising.

## 2024-03-04 NOTE — Telephone Encounter (Signed)
 Pt's insurance will not cover dexlansoprazole . Pt must try and fail lansoprazole and esomeprazole first.

## 2024-03-05 NOTE — Telephone Encounter (Signed)
Pt called regarding this , please advise  °

## 2024-03-07 ENCOUNTER — Other Ambulatory Visit: Payer: Self-pay

## 2024-03-07 DIAGNOSIS — M791 Myalgia, unspecified site: Secondary | ICD-10-CM | POA: Diagnosis not present

## 2024-03-07 DIAGNOSIS — M542 Cervicalgia: Secondary | ICD-10-CM | POA: Diagnosis not present

## 2024-03-07 DIAGNOSIS — G44009 Cluster headache syndrome, unspecified, not intractable: Secondary | ICD-10-CM | POA: Diagnosis not present

## 2024-03-07 DIAGNOSIS — G518 Other disorders of facial nerve: Secondary | ICD-10-CM | POA: Diagnosis not present

## 2024-03-07 MED ORDER — LANSOPRAZOLE 30 MG PO CPDR: 30.0000 mg | DELAYED_RELEASE_CAPSULE | Freq: Every day | ORAL | 11 refills | Status: AC

## 2024-03-07 NOTE — Telephone Encounter (Signed)
Rx was sent to pharmacy, pt was made aware.

## 2024-03-25 DIAGNOSIS — Z125 Encounter for screening for malignant neoplasm of prostate: Secondary | ICD-10-CM | POA: Diagnosis not present

## 2024-03-25 DIAGNOSIS — E559 Vitamin D deficiency, unspecified: Secondary | ICD-10-CM | POA: Diagnosis not present

## 2024-03-25 DIAGNOSIS — I10 Essential (primary) hypertension: Secondary | ICD-10-CM | POA: Diagnosis not present

## 2024-03-25 DIAGNOSIS — Z23 Encounter for immunization: Secondary | ICD-10-CM | POA: Diagnosis not present

## 2024-03-25 DIAGNOSIS — G9519 Other vascular myelopathies: Secondary | ICD-10-CM | POA: Diagnosis not present

## 2024-03-25 DIAGNOSIS — G4700 Insomnia, unspecified: Secondary | ICD-10-CM | POA: Diagnosis not present

## 2024-03-29 ENCOUNTER — Ambulatory Visit (HOSPITAL_COMMUNITY)
Admission: RE | Admit: 2024-03-29 | Discharge: 2024-03-29 | Disposition: A | Discharge status: Home / Self Care | Source: Ambulatory Visit | Attending: Cardiology | Admitting: Cardiology

## 2024-03-29 DIAGNOSIS — Q2112 Patent foramen ovale: Secondary | ICD-10-CM | POA: Insufficient documentation

## 2024-03-29 LAB — ECHOCARDIOGRAM COMPLETE BUBBLE STUDY
Area-P 1/2: 3.77 cm2
S' Lateral: 3 cm

## 2024-04-02 DIAGNOSIS — H16143 Punctate keratitis, bilateral: Secondary | ICD-10-CM | POA: Diagnosis not present

## 2024-04-02 NOTE — Progress Notes (Signed)
 Normal heart function. Small PFO again noted on echocardiogram. Previous stroke. 73 y/o makes PFO less likely to be the cause of stroke, but cannot be excluded. (ROPE score 4). Refer to see structural interventionalist to see if PFO closure would be appropriate.  Thanks MJP

## 2024-04-03 ENCOUNTER — Telehealth: Payer: Self-pay

## 2024-04-03 NOTE — Telephone Encounter (Signed)
 Scheduled the patient for PFO consult with Dr. Wonda on 04/17/2024. He was grateful for call and agreed with plan.

## 2024-04-03 NOTE — Telephone Encounter (Signed)
 Per Dr. Elmira, called to schedule Structural Heart consult to discuss PFO.  Left message to call back.

## 2024-04-08 ENCOUNTER — Other Ambulatory Visit: Payer: Self-pay | Admitting: Medical Genetics

## 2024-04-08 DIAGNOSIS — Z006 Encounter for examination for normal comparison and control in clinical research program: Secondary | ICD-10-CM

## 2024-04-17 ENCOUNTER — Encounter: Payer: Self-pay | Admitting: Cardiovascular Disease

## 2024-04-17 ENCOUNTER — Ambulatory Visit: Attending: Cardiovascular Disease | Admitting: Cardiovascular Disease

## 2024-04-17 VITALS — BP 128/74 | HR 112 | Ht 71.0 in | Wt 174.8 lb

## 2024-04-17 DIAGNOSIS — Q2112 Patent foramen ovale: Secondary | ICD-10-CM | POA: Diagnosis not present

## 2024-04-17 NOTE — Assessment & Plan Note (Addendum)
 I have personally reviewed the patient's echo images and he does have evidence of right-to-left shunting.  The most likely etiology of this would be PFO.  Based on the number of bubbles, the shunt has been quantified as small.

## 2024-04-17 NOTE — Progress Notes (Signed)
 Cardiology Office Note:    Date:  04/17/2024   ID:  Terry Morrow, DOB December 20, 1950, MRN [id number]  PCP:  Marvine Rush, MD   [facility] Providers Cardiologist:  Newman JINNY Lawrence, MD     Referring MD: Marvine Rush, MD   Chief Complaint  Patient presents with   PFO Consult    History of Present Illness:    Terry Morrow is a 73 y.o. male referred for evaluation of interatrial septal defect.  The patient reportedly had a remote stroke at age 22.  He had an episode of syncope/unresponsiveness 5 or 6 years ago.  Reports no history of seizures.  Patient is a longtime smoker, continues to smoke currently.  He had an echo bubble study 03/29/2024 which demonstrated a small PFO.  The patient is here with his wife today.  He has a known PFO and states that this was followed at [facility] previously.  He reports that he had echo studies performed about every other year which showed a stable the PFO with positive bubble study.  Transcatheter closure was never recommended.  The patient has not had any recent symptoms of stroke or TIA.  He has no exertional chest pain or pressure.  He has mild chronic dyspnea and is an active longtime smoker.  No edema, orthopnea, or PND.  History of headaches noted.  Past Medical History:  Diagnosis Date   ASD (atrial septal defect)    GERD (gastroesophageal reflux disease)    Headache(784.0)    MIGRAINE  --  LAST ONE ACOUPLE DAYS AGO   History of kidney stones yrs ago   Hypertension    Insomnia    Mild emphysema (HCC)    Neuromuscular disorder (HCC)    neuropathy improved since off depakote   PFO (patent foramen ovale)    PFO (patent foramen ovale) Ostium secundum type atrial septal defect   PONV (postoperative nausea and vomiting)    Prostate disorder    enlarged   Shortness of breath    with over exertion   Past Surgical History:  Procedure Laterality Date   BONE ANCHORED HEARING AID IMPLANT Left 11/15/2012    Procedure: LEFT BONE ANCHORED HEARING AID (BAHA) IMPLANT;  Surgeon: Ida Loader, MD;  Location: [facility] OR;  Service: ENT;  Laterality: Left;   COLONOSCOPY N/A 02/28/2024   Procedure: COLONOSCOPY;  Surgeon: Shaaron Lamar HERO, MD;  Location: [facility];  Service: Endoscopy;  Laterality: N/A;  1:00 pm, asa 3, LM to see if pt can come earlier   COLONOSCOPY WITH PROPOFOL  N/A 07/20/2017   Dr. Shaaron: Diverticulosis, 6 polyps removed, multiple tubular adenomas.  Surveillance exam advised in 3 years.   COLONOSCOPY WITH PROPOFOL  N/A 10/12/2020   Procedure: COLONOSCOPY WITH PROPOFOL ;  Surgeon: Shaaron Lamar HERO, MD;  Location: [facility];  Service: Endoscopy;  Laterality: N/A;  pm appt   CYSTECTOMY     larynx   CYSTOSCOPY WITH INSERTION OF UROLIFT N/A 01/15/2019   Procedure: CYSTOSCOPY WITH INSERTION OF UROLIFT;  Surgeon: Matilda Senior, MD;  Location: [facility] ORS;  Service: Urology;  Laterality: N/A;   dental implant surgery  10/02/2020   ELBOW SURGERY Bilateral    x 2 on both   ESOPHAGOGASTRODUODENOSCOPY  2011   Dr. Shaaron: Empiric dilation for history of dysphagia, hiatal hernia   ESOPHAGOGASTRODUODENOSCOPY N/A 02/28/2024   Procedure: EGD (ESOPHAGOGASTRODUODENOSCOPY);  Surgeon: Shaaron Lamar HERO, MD;  Location: [facility];  Service: Endoscopy;  Laterality: N/A;  EYE SURGERY     BIL  CATARACTS   INNER EAR SURGERY     x 2 on left   KNEE SURGERY Bilateral    x 2 both knees arthroscopic   MASTOIDECTOMY     left   NECK SURGERY     x 5 stiff at times limited neck rom   POLYPECTOMY  07/20/2017   Procedure: POLYPECTOMY;  Surgeon: Shaaron Lamar HERO, MD;  Location: [facility];  Service: Endoscopy;;  Ileo-cecal valve (HS) x 1; Hepatic flexure(HS) x1; Descending colon(HS) x2   POLYPECTOMY  10/12/2020   Procedure: POLYPECTOMY;  Surgeon: Shaaron Lamar HERO, MD;  Location: [facility];  Service: Endoscopy;;   ROTATOR CUFF REPAIR Right    WRIST SURGERY     left    Current Medications: Current Meds  Medication  Sig   albuterol  (PROVENTIL ) (2.5 MG/3ML) 0.083% nebulizer solution Take 2.5 mg by nebulization daily as needed for wheezing or shortness of breath. Shortness of breathe   albuterol  (VENTOLIN  HFA) 108 (90 Base) MCG/ACT inhaler Inhale 2 puffs into the lungs every 6 (six) hours as needed for wheezing or shortness of breath.   amitriptyline  (ELAVIL ) 100 MG tablet Take 100 mg by mouth at bedtime.   aspirin  EC 81 MG tablet Take 1 tablet (81 mg total) by mouth daily. Swallow whole.   budeson-glycopyrrolate -formoterol (BREZTRI  AEROSPHERE) 160-9-4.8 MCG/ACT AERO Inhale 2 puffs into the lungs in the morning and at bedtime.   buPROPion  ER (WELLBUTRIN  SR) 100 MG 12 hr tablet Take 1 tablet (100 mg total) by mouth 2 (two) times daily. For smoking cessation   gabapentin  (NEURONTIN ) 300 MG capsule Take 300 mg by mouth at bedtime.   Glycerin-Hypromellose-PEG 400 (DRY EYE RELIEF DROPS) 0.2-0.2-1 % SOLN Place 1 drop into both eyes daily as needed (Dry eyes).   HYDROcodone -acetaminophen  (NORCO) 10-325 MG tablet Take 1 tablet by mouth every 6 (six) hours as needed for severe pain or moderate pain.   lansoprazole (PREVACID) 30 MG capsule Take 1 capsule (30 mg total) by mouth daily at 12 noon.   Multiple Vitamins-Minerals (ONE-A-DAY MENS 50+ PO) Take 1 tablet by mouth daily.   nicotine  (NICODERM CQ  - DOSED IN MG/24 HOURS) 14 mg/24hr patch Place 1 patch (14 mg total) onto the skin daily.   QUEtiapine (SEROQUEL) 50 MG tablet Take 50 mg by mouth at bedtime.   rosuvastatin  (CRESTOR ) 20 MG tablet Take 1 tablet (20 mg total) by mouth daily.   SUMAtriptan (IMITREX) 100 MG tablet Take by mouth.   verapamil (CALAN) 120 MG tablet Take 120 mg by mouth 3 (three) times daily.   zolpidem (AMBIEN) 10 MG tablet Take 10 mg by mouth at bedtime.     Allergies:   Patient has no known allergies.   ROS:   Please see the history of present illness.    All other systems reviewed and are negative.  EKGs/Labs/Other Studies Reviewed:     The following studies were reviewed today: Cardiac Studies & Procedures   ______________________________________________________________________________________________     ECHOCARDIOGRAM  ECHOCARDIOGRAM COMPLETE BUBBLE STUDY 03/29/2024  Narrative ECHOCARDIOGRAM REPORT    Patient Name:   JONTAE ADEBAYO Date of Exam: 03/29/2024 Medical Rec #:  [id number]       Height:       71.0 in Accession #:    [id number]      Weight:       174.0 lb Date of Birth:  1951-06-02       BSA:  1.987 m Patient Age:    73 years        BP:           122/60 mmHg Patient Gender: M               HR:           96 bpm. Exam Location:  [address]  Procedure: 2D Echo, Cardiac Doppler, Color Doppler, 3D Echo, Strain Analysis and Saline Contrast Bubble Study (Both Spectral and Color Flow Doppler were utilized during procedure).  Indications:    PFO (patent formamen ovale) 745.5 / Q21.1  History:        Patient has no prior history of Echocardiogram examinations. Risk Factors:Hypertension.  Sonographer:    Augustin Seals RDCS Referring Phys: [id number] [facility] J PATWARDHAN  IMPRESSIONS   1. Left ventricular ejection fraction, by estimation, is 55 to 60%. The left ventricle has normal function. The left ventricle has no regional wall motion abnormalities. Left ventricular diastolic parameters are consistent with Grade I diastolic dysfunction (impaired relaxation). The average left ventricular global longitudinal strain is -18.5 %. The global longitudinal strain is normal. 2. Right ventricular systolic function is normal. The right ventricular size is normal. 3. Small PFO by color flow and positive bubble study . Evidence of atrial level shunting detected by color flow Doppler. Agitated saline contrast bubble study was positive with shunting observed within 3-6 cardiac cycles suggestive of interatrial shunt. 4. The mitral valve is abnormal. Trivial mitral valve regurgitation. No evidence of  mitral stenosis. 5. The aortic valve is tricuspid. There is mild calcification of the aortic valve. There is mild thickening of the aortic valve. Aortic valve regurgitation is not visualized. Aortic valve sclerosis is present, with no evidence of aortic valve stenosis. 6. The inferior vena cava is normal in size with greater than 50% respiratory variability, suggesting right atrial pressure of 3 mmHg.  FINDINGS Left Ventricle: Left ventricular ejection fraction, by estimation, is 55 to 60%. The left ventricle has normal function. The left ventricle has no regional wall motion abnormalities. The average left ventricular global longitudinal strain is -18.5 %. Strain was performed and the global longitudinal strain is normal. The left ventricular internal cavity size was normal in size. There is no left ventricular hypertrophy. Left ventricular diastolic parameters are consistent with Grade I diastolic dysfunction (impaired relaxation).  Right Ventricle: The right ventricular size is normal. No increase in right ventricular wall thickness. Right ventricular systolic function is normal.  Left Atrium: Left atrial size was normal in size.  Right Atrium: Right atrial size was normal in size.  Pericardium: There is no evidence of pericardial effusion.  Mitral Valve: The mitral valve is abnormal. There is mild thickening of the mitral valve leaflet(s). Trivial mitral valve regurgitation. No evidence of mitral valve stenosis.  Tricuspid Valve: The tricuspid valve is normal in structure. Tricuspid valve regurgitation is trivial. No evidence of tricuspid stenosis.  Aortic Valve: The aortic valve is tricuspid. There is mild calcification of the aortic valve. There is mild thickening of the aortic valve. Aortic valve regurgitation is not visualized. Aortic valve sclerosis is present, with no evidence of aortic valve stenosis.  Pulmonic Valve: The pulmonic valve was normal in structure. Pulmonic valve  regurgitation is trivial. No evidence of pulmonic stenosis.  Aorta: The aortic root is normal in size and structure.  Venous: The inferior vena cava is normal in size with greater than 50% respiratory variability, suggesting right atrial pressure of 3 mmHg.  IAS/Shunts:  Evidence of atrial level shunting detected by color flow Doppler. Agitated saline contrast was given intravenously to evaluate for intracardiac shunting. Agitated saline contrast bubble study was positive with shunting observed within 3-6 cardiac cycles suggestive of interatrial shunt.  Additional Comments: 3D was performed not requiring image post processing on an independent workstation and was normal.   LEFT VENTRICLE PLAX 2D LVIDd:         4.20 cm   Diastology LVIDs:         3.00 cm   LV e' medial:    7.29 cm/s LV PW:         1.10 cm   LV E/e' medial:  8.1 LV IVS:        1.00 cm   LV e' lateral:   10.10 cm/s LVOT diam:     2.40 cm   LV E/e' lateral: 5.8 LV SV:         75 LV SV Index:   38        2D Longitudinal Strain LVOT Area:     4.52 cm  2D Strain GLS Avg:     -18.5 %  3D Volume EF: 3D EF:        58 % LV EDV:       123 ml LV ESV:       52 ml LV SV:        71 ml  RIGHT VENTRICLE             IVC RV Basal diam:  2.60 cm     IVC diam: 2.00 cm RV S prime:     14.00 cm/s RVOT diam:      1.80 cm TAPSE (M-mode): 2.8 cm  LEFT ATRIUM             Index        RIGHT ATRIUM           Index LA diam:        3.40 cm 1.71 cm/m   RA Area:     12.20 cm LA Vol (A2C):   46.8 ml 23.55 ml/m  RA Volume:   24.70 ml  12.43 ml/m LA Vol (A4C):   39.4 ml 19.83 ml/m LA Biplane Vol: 43.7 ml 21.99 ml/m AORTIC VALVE LVOT Vmax:   94.30 cm/s LVOT Vmean:  61.800 cm/s LVOT VTI:    0.166 m  AORTA Ao Root diam: 3.40 cm  MITRAL VALVE MV Area (PHT): 3.77 cm    SHUNTS MV Decel Time: 201 msec    Systemic VTI:  0.17 m MV E velocity: 58.70 cm/s  Systemic Diam: 2.40 cm MV A velocity: 75.50 cm/s  Pulmonic Diam: 1.80 cm MV E/A  ratio:  0.78  Maude Emmer MD Electronically signed by Maude Emmer MD Signature Date/Time: 03/29/2024/12:59:15 PM    Final          ______________________________________________________________________________________________      EKG:   EKG Interpretation Date/Time:  Wednesday April 17 2024 11:32:04 EST Ventricular Rate:  112 PR Interval:  172 QRS Duration:  74 QT Interval:  340 QTC Calculation: 464 R Axis:   46  Text Interpretation: Sinus tachycardia When compared with ECG of 21-Feb-2024 10:57, No significant change was found Confirmed by Wonda Sharper [phone]) on 04/17/2024 11:43:38 AM    Recent Labs: 02/21/2024: BUN 12; Creatinine, Ser 1.22; Hemoglobin 13.5; Platelets 360; Potassium 4.0; Sodium 140  Recent Lipid Panel    Component Value Date/Time   CHOL 149 02/21/2024 1149  TRIG 125 02/21/2024 1149   HDL 45 02/21/2024 1149   CHOLHDL 3.3 02/21/2024 1149   LDLCALC 82 02/21/2024 1149     Risk Assessment/Calculations:                Physical Exam:    VS:  BP 128/74 (BP Location: Left Arm, Patient Position: Sitting, Cuff Size: Normal)   Pulse (!) 112   Ht 5' 11 (1.803 m)   Wt 174 lb 12.8 oz (79.3 kg)   SpO2 96%   BMI 24.38 kg/m     Wt Readings from Last 3 Encounters:  04/17/24 174 lb 12.8 oz (79.3 kg)  02/28/24 174 lb (78.9 kg)  02/23/24 174 lb (78.9 kg)     GEN:  Well nourished, well developed in no acute distress HEENT: Normal NECK: No JVD; No carotid bruits LYMPHATICS: No lymphadenopathy CARDIAC: RRR, no murmurs, rubs, gallops RESPIRATORY:  Clear to auscultation without rales, wheezing or rhonchi  ABDOMEN: Soft, non-tender, non-distended MUSCULOSKELETAL:  No edema; No deformity  SKIN: Warm and dry NEUROLOGIC:  Alert and oriented x 3 PSYCHIATRIC:  Normal affect   Assessment & Plan PFO (patent foramen ovale) I have personally reviewed the patient's echo images and he does have evidence of right-to-left shunting.  The most likely  etiology of this would be PFO.  Based on the number of bubbles, the shunt has been quantified as small.  For this patients PFO, there is a low likelihood of contribution to stroke/TIA with a RoPE score of 0 to 3, Rope score = 2.   Above score calculated as 1 point each if NOT present [HTN, DM2, h/o CVA/TIA, Smoker, Cortical imaging on infarct] Above score calculated as 5 points if [Age 37 to 29], 4 points if [Age 22 to 39], 3 points if [Age 95 to 49], 2 points if [Age 65 to 59], 1 point if [Age 75 to 69], 0 points if [Age > 70]  With a small PFO and low Rope score (meaning multiple other stroke risk factors making PFO less likely pathogenic), transcatheter PFO closure is unlikely to provide any significant clinical benefit.  I recommended the patient continue on aspirin  for antiplatelet therapy and statin drug.  He understands the most powerful way to reduce his risk moving forward is with tobacco cessation.  I would be happy to see him back on an as needed basis, but I would not anticipate PFO closure being indicated in the future.            Medication Adjustments/Labs and Tests Ordered: Current medicines are reviewed at length with the patient today.  Concerns regarding medicines are outlined above.  Orders Placed This Encounter  Procedures   EKG 12-Lead   No orders of the defined types were placed in this encounter.   Patient Instructions  Medication Instructions:  No medication changes were made at this visit. Continue current regimen.   *If you need a refill on your cardiac medications before your next appointment, please call your pharmacy*  Lab Work: None ordered today. If you have labs (blood work) drawn today and your tests are completely normal, you will receive your results only by: MyChart Message (if you have MyChart) OR A paper copy in the mail If you have any lab test that is abnormal or we need to change your treatment, we will call you to review the  results.  Testing/Procedures: None ordered today.  Follow-Up: At [facility], you and your health needs are our priority.  As part of our continuing mission to provide you with exceptional heart care, our providers are all part of one team.  This team includes your primary Cardiologist (physician) and Advanced Practice Providers or APPs (Physician Assistants and Nurse Practitioners) who all work together to provide you with the care you need, when you need it.  Your next appointment:   As needed  Provider:   Ozell Fell, MD      Signed, Ozell Fell, MD  04/17/2024 11:56 AM    [facility]

## 2024-04-17 NOTE — Patient Instructions (Signed)
 Medication Instructions:  No medication changes were made at this visit. Continue current regimen.   *If you need a refill on your cardiac medications before your next appointment, please call your pharmacy*  Lab Work: None ordered today. If you have labs (blood work) drawn today and your tests are completely normal, you will receive your results only by: MyChart Message (if you have MyChart) OR A paper copy in the mail If you have any lab test that is abnormal or we need to change your treatment, we will call you to review the results.  Testing/Procedures: None ordered today.  Follow-Up: At [facility], you and your health needs are our priority.  As part of our continuing mission to provide you with exceptional heart care, our providers are all part of one team.  This team includes your primary Cardiologist (physician) and Advanced Practice Providers or APPs (Physician Assistants and Nurse Practitioners) who all work together to provide you with the care you need, when you need it.  Your next appointment:   As needed  Provider:   Ozell Fell, MD

## 2024-04-24 DIAGNOSIS — G518 Other disorders of facial nerve: Secondary | ICD-10-CM | POA: Diagnosis not present

## 2024-04-24 DIAGNOSIS — M542 Cervicalgia: Secondary | ICD-10-CM | POA: Diagnosis not present

## 2024-04-24 DIAGNOSIS — G44009 Cluster headache syndrome, unspecified, not intractable: Secondary | ICD-10-CM | POA: Diagnosis not present

## 2024-04-24 DIAGNOSIS — M791 Myalgia, unspecified site: Secondary | ICD-10-CM | POA: Diagnosis not present

## 2024-04-29 NOTE — Progress Notes (Unsigned)
 Impression/Assessment:  1.  BPH with LUTS, doing well following UroLift 5 years ago  2.  Prostate cancer screening-PSA October 1.03.  He has had a normal DRE  Plan:  As he has no current urologic issues, I will have him come back as needed    History of Present Illness:  11.19.2024: Here for follow-up of BPH and prostate cancer screening.  Underwent UroLift on 8.11.2020.  5 implant placed.  He had a prostate volume of 35 mL preop.  He still voids well.  He has no significant urologic concerns.  IPSS 11/3.  Biggest issue is a slow stream when he is overfilled.  11.25.2025: Here for routine check.  No real urinary issues of note other than slow stream in the morning.  He empties well, no gross hematuria, no dysuria.  Past Medical History:  Diagnosis Date   ASD (atrial septal defect)    GERD (gastroesophageal reflux disease)    Headache(784.0)    MIGRAINE  --  LAST ONE ACOUPLE DAYS AGO   History of kidney stones yrs ago   Hypertension    Insomnia    Mild emphysema (HCC)    Neuromuscular disorder (HCC)    neuropathy improved since off depakote   PFO (patent foramen ovale)    PFO (patent foramen ovale) Ostium secundum type atrial septal defect   PONV (postoperative nausea and vomiting)    Prostate disorder    enlarged   Shortness of breath    with over exertion    Past Surgical History:  Procedure Laterality Date   BONE ANCHORED HEARING AID IMPLANT Left 11/15/2012   Procedure: LEFT BONE ANCHORED HEARING AID (BAHA) IMPLANT;  Surgeon: Ida Loader, MD;  Location: [facility];  Service: ENT;  Laterality: Left;   COLONOSCOPY N/A 02/28/2024   Procedure: COLONOSCOPY;  Surgeon: Shaaron Lamar HERO, MD;  [facility];  Service: Endoscopy;  Laterality: N/A;  1:00 pm, asa 3, LM to see if pt can come earlier   COLONOSCOPY WITH PROPOFOL  N/A 07/20/2017   Dr. Shaaron: Diverticulosis, 6 polyps removed, multiple tubular adenomas.  Surveillance exam advised in 3 years.   COLONOSCOPY WITH  PROPOFOL  N/A 10/12/2020   Procedure: COLONOSCOPY WITH PROPOFOL ;  Surgeon: Shaaron Lamar HERO, MD;  [facility];  Service: Endoscopy;  Laterality: N/A;  pm appt   CYSTECTOMY     larynx   CYSTOSCOPY WITH INSERTION OF UROLIFT N/A 01/15/2019   Procedure: CYSTOSCOPY WITH INSERTION OF UROLIFT;  Surgeon: Matilda Senior, MD;  Location: [facility];  Service: Urology;  Laterality: N/A;   dental implant surgery  10/02/2020   ELBOW SURGERY Bilateral    x 2 on both   ESOPHAGOGASTRODUODENOSCOPY  2011   Dr. Shaaron: Empiric dilation for history of dysphagia, hiatal hernia   ESOPHAGOGASTRODUODENOSCOPY N/A 02/28/2024   Procedure: EGD (ESOPHAGOGASTRODUODENOSCOPY);  Surgeon: Shaaron Lamar HERO, MD;  [facility];  Service: Endoscopy;  Laterality: N/A;   EYE SURGERY     BIL  CATARACTS   INNER EAR SURGERY     x 2 on left   KNEE SURGERY Bilateral    x 2 both knees arthroscopic   MASTOIDECTOMY     left   NECK SURGERY     x 5 stiff at times limited neck rom   POLYPECTOMY  07/20/2017   Procedure: POLYPECTOMY;  Surgeon: Shaaron Lamar HERO, MD;  [facility];  Service: Endoscopy;;  Ileo-cecal valve (HS) x 1; Hepatic flexure(HS) x1; Descending colon(HS) x2  POLYPECTOMY  10/12/2020   Procedure: POLYPECTOMY;  Surgeon: Shaaron Lamar HERO, MD;  [facility];  Service: Endoscopy;;   ROTATOR CUFF REPAIR Right    WRIST SURGERY     left    Home Medications:  Allergies as of 04/30/2024   No Known Allergies      Medication List        Accurate as of April 29, 2024 11:56 AM. If you have any questions, ask your nurse or doctor.          albuterol  (2.5 MG/3ML) 0.083% nebulizer solution Commonly known as: PROVENTIL  Take 2.5 mg by nebulization daily as needed for wheezing or shortness of breath. Shortness of breathe   albuterol  108 (90 Base) MCG/ACT inhaler Commonly known as: VENTOLIN  HFA Inhale 2 puffs into the lungs every 6 (six) hours as needed for wheezing or shortness of  breath.   amitriptyline  100 MG tablet Commonly known as: ELAVIL  Take 100 mg by mouth at bedtime.   Aspirin  Low Dose 81 MG tablet Generic drug: aspirin  EC Take 1 tablet (81 mg total) by mouth daily. Swallow whole.   Breztri  Aerosphere 160-9-4.8 MCG/ACT Aero inhaler Generic drug: budesonide-glycopyrrolate -formoterol Inhale 2 puffs into the lungs in the morning and at bedtime.   buPROPion  ER 100 MG 12 hr tablet Commonly known as: Wellbutrin  SR Take 1 tablet (100 mg total) by mouth 2 (two) times daily. For smoking cessation   Dry Eye Relief Drops 0.2-0.2-1 % Soln Generic drug: Glycerin-Hypromellose-PEG 400 Place 1 drop into both eyes daily as needed (Dry eyes).   gabapentin  300 MG capsule Commonly known as: NEURONTIN  Take 300 mg by mouth at bedtime.   HYDROcodone -acetaminophen  10-325 MG tablet Commonly known as: NORCO Take 1 tablet by mouth every 6 (six) hours as needed for severe pain or moderate pain.   lansoprazole  30 MG capsule Commonly known as: PREVACID  Take 1 capsule (30 mg total) by mouth daily at 12 noon.   nicotine  14 mg/24hr patch Commonly known as: NICODERM CQ  - dosed in mg/24 hours Place 1 patch (14 mg total) onto the skin daily.   ONE-A-DAY MENS 50+ PO Take 1 tablet by mouth daily.   QUEtiapine 50 MG tablet Commonly known as: SEROQUEL Take 50 mg by mouth at bedtime.   rosuvastatin  20 MG tablet Commonly known as: CRESTOR  Take 1 tablet (20 mg total) by mouth daily.   SUMAtriptan 100 MG tablet Commonly known as: IMITREX Take by mouth.   verapamil 120 MG tablet Commonly known as: CALAN Take 120 mg by mouth 3 (three) times daily.   zolpidem 10 MG tablet Commonly known as: AMBIEN Take 10 mg by mouth at bedtime.        Allergies: No Known Allergies  Family History  Problem Relation Age of Onset   Colon cancer Sister        diagnosed at age <16 several years later metastasizd   Colon cancer Maternal Grandfather     Social History:  reports  that he has been smoking cigarettes. He has a 38 pack-year smoking history. He has never used smokeless tobacco. He reports that he does not drink alcohol and does not use drugs.  ROS: A complete review of systems was performed.  All systems are negative except for pertinent findings as noted.  Physical Exam:  Vital signs in last 24 hours: There were no vitals taken for this visit. Constitutional:  Alert and oriented, No acute distress Cardiovascular: Regular rate  Respiratory: Normal respiratory effort Neurologic: Grossly  intact, no focal deficits Psychiatric: Normal mood and affect  I have reviewed prior pt notes  I have reviewed urinalysis results--clear  I have independently reviewed prior imaging--prostate ultrasound from 2020 reviewed  I have reviewed prior PSA results

## 2024-04-30 ENCOUNTER — Encounter: Payer: Self-pay | Admitting: Urology

## 2024-04-30 ENCOUNTER — Ambulatory Visit: Payer: Medicare Other | Admitting: Urology

## 2024-04-30 ENCOUNTER — Other Ambulatory Visit: Payer: Self-pay | Admitting: *Deleted

## 2024-04-30 ENCOUNTER — Telehealth: Payer: Self-pay | Admitting: *Deleted

## 2024-04-30 VITALS — BP 104/64 | HR 112

## 2024-04-30 DIAGNOSIS — Z87891 Personal history of nicotine dependence: Secondary | ICD-10-CM

## 2024-04-30 DIAGNOSIS — F17210 Nicotine dependence, cigarettes, uncomplicated: Secondary | ICD-10-CM

## 2024-04-30 DIAGNOSIS — N400 Enlarged prostate without lower urinary tract symptoms: Secondary | ICD-10-CM | POA: Diagnosis not present

## 2024-04-30 DIAGNOSIS — R3912 Poor urinary stream: Secondary | ICD-10-CM

## 2024-04-30 DIAGNOSIS — Z122 Encounter for screening for malignant neoplasm of respiratory organs: Secondary | ICD-10-CM

## 2024-04-30 DIAGNOSIS — N401 Enlarged prostate with lower urinary tract symptoms: Secondary | ICD-10-CM

## 2024-04-30 LAB — URINALYSIS, ROUTINE W REFLEX MICROSCOPIC
Bilirubin, UA: NEGATIVE
Glucose, UA: NEGATIVE
Leukocytes,UA: NEGATIVE
Nitrite, UA: NEGATIVE
Protein,UA: NEGATIVE
RBC, UA: NEGATIVE
Specific Gravity, UA: 1.02 (ref 1.005–1.030)
Urobilinogen, Ur: 2 mg/dL — ABNORMAL HIGH (ref 0.2–1.0)
pH, UA: 7 (ref 5.0–7.5)

## 2024-04-30 LAB — BLADDER SCAN AMB NON-IMAGING: Scan Result: 2

## 2024-04-30 NOTE — Telephone Encounter (Signed)
 Lung Cancer Screening Narrative/Criteria Questionnaire (Cigarette Smokers Only- No Cigars/Pipes/vapes)   Terry Morrow   SDMV:05/15/24 9:30- [name]                                           Nov 01, 1950              LDCT: 05/23/24 9:30- [facility]    73 y.o.   Phone: [phone]  Lung Screening Narrative (confirm age 46-77 yrs Medicare / 50-80 yrs Private pay insurance)   Insurance information:BCBS   Referring Provider:Wert   This screening involves an initial phone call with a team member from our program. It is called a shared decision making visit. The initial meeting is required by insurance and Medicare to make sure you understand the program. This appointment takes about 15-20 minutes to complete. The CT scan will completed at a separate date/time. This scan takes about 5-10 minutes to complete and you may eat and drink before and after the scan.  Criteria questions for Lung Cancer Screening:   Are you a current or former smoker? Current Age began smoking: 14   If you are a former smoker, what year did you quit smoking? (within 15 yrs)   To calculate your smoking history, I need an accurate estimate of how many packs of cigarettes you smoked per day and for how many years. (Not just the number of PPD you are now smoking)   Years smoking 59 x Packs per day 1 = Pack years 59   (at least 20 pack yrs)   (Make sure they understand that we need to know how much they have smoked in the past, not just the number of PPD they are smoking now)  Do you have a personal history of cancer?  No    Do you have a family history of cancer? Yes  (cancer type and and relative) sister (colon)  Are you coughing up blood?  No  Have you had unexplained weight loss of 15 lbs or more in the last 6 months? No  It looks like you meet all criteria.     Additional information: N/A

## 2024-04-30 NOTE — Progress Notes (Signed)
 Bladder Scan completed today due to reason of BPH  Patient can void prior to the bladder scan. Bladder scan result: 2  Performed By: Exie T. CMA  Additional notes- Patient is scheduled to follow up with MD

## 2024-05-06 ENCOUNTER — Telehealth: Payer: Self-pay

## 2024-05-06 NOTE — Telephone Encounter (Signed)
 Tried calling pt  back with no answer. LVM for return call to office

## 2024-05-06 NOTE — Telephone Encounter (Signed)
 Return call to patient making him aware his UA was seen by Dr. Matilda during his office visited. Pt voiced understanding

## 2024-05-11 LAB — GENECONNECT MOLECULAR SCREEN: Genetic Analysis Overall Interpretation: NEGATIVE

## 2024-05-15 ENCOUNTER — Ambulatory Visit (INDEPENDENT_AMBULATORY_CARE_PROVIDER_SITE_OTHER): Admitting: Adult Health

## 2024-05-15 ENCOUNTER — Encounter: Payer: Self-pay | Admitting: Adult Health

## 2024-05-15 DIAGNOSIS — F17210 Nicotine dependence, cigarettes, uncomplicated: Secondary | ICD-10-CM | POA: Diagnosis not present

## 2024-05-15 NOTE — Progress Notes (Signed)
°  Virtual Visit via Telephone Note  I connected with ANTAVIOUS SPANOS , 05/15/24 9:33 AM by a telemedicine application and verified that I am speaking with the correct person using two identifiers.  Location: Patient: home Provider: home   I discussed the limitations of evaluation and management by telemedicine and the availability of in person appointments. The patient expressed understanding and agreed to proceed.   Shared Decision Making Visit Lung Cancer Screening Program [phone])   Eligibility: 73 y.o. Pack Years Smoking History Calculation = 59 pack years  (# packs/per year x # years smoked) Recent History of coughing up blood  no Unexplained weight loss? no ( >Than 15 pounds within the last 6 months ) Prior History Lung / other cancer no (Diagnosis within the last 5 years already requiring surveillance chest CT Scans). Smoking Status Current Smoker   Visit Components: Discussion included one or more decision making aids. YES Discussion included risk/benefits of screening. YES Discussion included potential follow up diagnostic testing for abnormal scans. YES Discussion included meaning and risk of over diagnosis. YES Discussion included meaning and risk of False Positives. YES Discussion included meaning of total radiation exposure. YES  Counseling Included: Importance of adherence to annual lung cancer LDCT screening. YES Impact of comorbidities on ability to participate in the program. YES Ability and willingness to under diagnostic treatment. YES  Smoking Cessation Counseling: Current Smokers:  Discussed importance of smoking cessation. yes Information about tobacco cessation classes and interventions provided to patient. yes Patient provided with ticket for LDCT Scan. yes Symptomatic Patient. NO Diagnosis Code: Tobacco Use Z72.0 Asymptomatic Patient yes  Counseling - 4 minutes of smoking cessation counseling  (CT Chest Lung Cancer Screening Low Dose W/O CM)  [id number]  Smoking/Tobacco Cessation Counseling NASEER HEARN is a current user of tobacco or nicotine  products. He is considering quitting at this time. Counseling provided today addressed the risks of continued use and the benefits of cessation. Discussed tobacco/nicotine  use history, readiness to quit, and evidence-based treatment options including behavioral strategies, support resources, and pharmacologic therapies. Provided encouragement and educational materials on steps and resources to quit smoking. Patient questions were addressed, and follow-up recommended for continued support. Total time spent on counseling: 3 minutes.    Z12.2-Screening of respiratory organs Z87.891-Personal history of nicotine  dependence   Lamarr Myers 05/15/24

## 2024-05-15 NOTE — Patient Instructions (Signed)

## 2024-05-17 ENCOUNTER — Ambulatory Visit
Admission: RE | Admit: 2024-05-17 | Discharge: 2024-05-17 | Disposition: A | Discharge status: Home / Self Care | Attending: Family Medicine

## 2024-05-17 VITALS — BP 144/72 | HR 115 | Temp 98.0°F | Resp 18

## 2024-05-17 DIAGNOSIS — J3089 Other allergic rhinitis: Secondary | ICD-10-CM | POA: Diagnosis not present

## 2024-05-17 DIAGNOSIS — H6992 Unspecified Eustachian tube disorder, left ear: Secondary | ICD-10-CM | POA: Diagnosis not present

## 2024-05-17 MED ORDER — PREDNISONE 20 MG PO TABS: 40.0000 mg | ORAL_TABLET | Freq: Every day | ORAL | 0 refills | Status: AC

## 2024-05-17 MED ORDER — AZELASTINE HCL 0.1 % NA SOLN: 1.0000 | Freq: Two times a day (BID) | NASAL | 0 refills | Status: AC

## 2024-05-17 NOTE — ED Triage Notes (Signed)
 Left ear pain when swallowing  x 1 week.  States today ear hurts.

## 2024-05-18 NOTE — ED Provider Notes (Signed)
 [facility]    CSN: [id number] Arrival date & time: 05/17/24  1824      History   Chief Complaint Chief Complaint  Patient presents with   Ear Fullness    Left ear pain and pressure . When I swallow there is pain behind ear. Time span about a week. - Entered by patient    HPI Terry Morrow is a 73 y.o. male.   Patient presenting today with 1 week history of left ear pain worse with swallowing, popping sensation.  Has had some sinus congestion but otherwise denies headache, fever, bleeding or drainage from the ear, cough, chest pain, shortness of breath.  States long history of issues with this ear, has had surgeries in the past and recurrent ear infections.  Trying Flonase with minimal relief.    Past Medical History:  Diagnosis Date   ASD (atrial septal defect)    GERD (gastroesophageal reflux disease)    Headache(784.0)    MIGRAINE  --  LAST ONE ACOUPLE DAYS AGO   History of kidney stones yrs ago   Hypertension    Insomnia    Mild emphysema (HCC)    Neuromuscular disorder (HCC)    neuropathy improved since off depakote   PFO (patent foramen ovale)    PFO (patent foramen ovale) Ostium secundum type atrial septal defect   PONV (postoperative nausea and vomiting)    Prostate disorder    enlarged   Shortness of breath    with over exertion    Patient Active Problem List   Diagnosis Date Noted   Mixed hyperlipidemia 02/21/2024   H/O: stroke 02/21/2024   ASD (atrial septal defect) 01/22/2024   Nicotine  dependence, cigarettes, uncomplicated 05/22/2023   Low back pain 04/14/2022   H/O adenomatous polyp of colon 08/24/2020   Constipation 08/24/2020   FH: colon cancer in first degree relative <41 years old 08/24/2020   Encounter for screening colonoscopy 06/23/2017   H/O failed conscious sedation 06/23/2017   COPD GOLD 0/  AB 01/23/2014   Migraines 01/23/2014   PFO (patent foramen ovale) 01/23/2014   GERD 01/05/2010   OTHER DYSPHAGIA 01/05/2010     Past Surgical History:  Procedure Laterality Date   BONE ANCHORED HEARING AID IMPLANT Left 11/15/2012   Procedure: LEFT BONE ANCHORED HEARING AID (BAHA) IMPLANT;  Surgeon: Ida Loader, MD;  Location: [facility];  Service: ENT;  Laterality: Left;   COLONOSCOPY N/A 02/28/2024   Procedure: COLONOSCOPY;  Surgeon: Shaaron Lamar HERO, MD;  [facility];  Service: Endoscopy;  Laterality: N/A;  1:00 pm, asa 3, LM to see if pt can come earlier   COLONOSCOPY WITH PROPOFOL  N/A 07/20/2017   Dr. Shaaron: Diverticulosis, 6 polyps removed, multiple tubular adenomas.  Surveillance exam advised in 3 years.   COLONOSCOPY WITH PROPOFOL  N/A 10/12/2020   Procedure: COLONOSCOPY WITH PROPOFOL ;  Surgeon: Shaaron Lamar HERO, MD;  [facility];  Service: Endoscopy;  Laterality: N/A;  pm appt   CYSTECTOMY     larynx   CYSTOSCOPY WITH INSERTION OF UROLIFT N/A 01/15/2019   Procedure: CYSTOSCOPY WITH INSERTION OF UROLIFT;  Surgeon: Matilda Senior, MD;  Location: [facility];  Service: Urology;  Laterality: N/A;   dental implant surgery  10/02/2020   ELBOW SURGERY Bilateral    x 2 on both   ESOPHAGOGASTRODUODENOSCOPY  2011   Dr. Shaaron: Empiric dilation for history of dysphagia, hiatal hernia   ESOPHAGOGASTRODUODENOSCOPY N/A 02/28/2024   Procedure: EGD (ESOPHAGOGASTRODUODENOSCOPY);  Surgeon: Shaaron Lamar HERO,  MD;  [facility];  Service: Endoscopy;  Laterality: N/A;   EYE SURGERY     BIL  CATARACTS   INNER EAR SURGERY     x 2 on left   KNEE SURGERY Bilateral    x 2 both knees arthroscopic   MASTOIDECTOMY     left   NECK SURGERY     x 5 stiff at times limited neck rom   POLYPECTOMY  07/20/2017   Procedure: POLYPECTOMY;  Surgeon: Shaaron Lamar HERO, MD;  [facility];  Service: Endoscopy;;  Ileo-cecal valve (HS) x 1; Hepatic flexure(HS) x1; Descending colon(HS) x2   POLYPECTOMY  10/12/2020   Procedure: POLYPECTOMY;  Surgeon: Shaaron Lamar HERO, MD;  [facility];  Service: Endoscopy;;    ROTATOR CUFF REPAIR Right    WRIST SURGERY     left       Home Medications    Prior to Admission medications  Medication Sig Start Date End Date Taking? Authorizing Provider  azelastine  (ASTELIN ) 0.1 % nasal spray Place 1 spray into both nostrils 2 (two) times daily. Use in each nostril as directed 05/17/24  Yes Stuart Vernell Norris, PA-C  predniSONE  (DELTASONE ) 20 MG tablet Take 2 tablets (40 mg total) by mouth daily with breakfast. 05/17/24  Yes Stuart Vernell Norris, PA-C  albuterol  (PROVENTIL ) (2.5 MG/3ML) 0.083% nebulizer solution Take 2.5 mg by nebulization daily as needed for wheezing or shortness of breath. Shortness of breathe    [provider]  albuterol  (VENTOLIN  HFA) 108 (90 Base) MCG/ACT inhaler Inhale 2 puffs into the lungs every 6 (six) hours as needed for wheezing or shortness of breath.    [provider]  amitriptyline  (ELAVIL ) 100 MG tablet Take 100 mg by mouth at bedtime.    [provider]  aspirin  EC 81 MG tablet Take 1 tablet (81 mg total) by mouth daily. Swallow whole. 02/21/24   Patwardhan, Newman PARAS, MD  budeson-glycopyrrolate -formoterol (BREZTRI  AEROSPHERE) 160-9-4.8 MCG/ACT AERO Inhale 2 puffs into the lungs in the morning and at bedtime. 08/29/23   Darlean Ozell NOVAK, MD  buPROPion  ER (WELLBUTRIN  SR) 100 MG 12 hr tablet Take 1 tablet (100 mg total) by mouth 2 (two) times daily. For smoking cessation 03/04/24   Patwardhan, Newman PARAS, MD  gabapentin  (NEURONTIN ) 300 MG capsule Take 300 mg by mouth at bedtime. 02/17/22   [provider]  Glycerin-Hypromellose-PEG 400 (DRY EYE RELIEF DROPS) 0.2-0.2-1 % SOLN Place 1 drop into both eyes daily as needed (Dry eyes).    [provider]  HYDROcodone -acetaminophen  (NORCO) 10-325 MG tablet Take 1 tablet by mouth every 6 (six) hours as needed for severe pain or moderate pain.    [provider]  lansoprazole  (PREVACID ) 30 MG capsule Take 1 capsule (30 mg total) by mouth daily  at 12 noon. 03/07/24   Rourk, Lamar HERO, MD  Multiple Vitamins-Minerals (ONE-A-DAY MENS 50+ PO) Take 1 tablet by mouth daily.    [provider]  nicotine  (NICODERM CQ  - DOSED IN MG/24 HOURS) 14 mg/24hr patch Place 1 patch (14 mg total) onto the skin daily. 02/21/24   Patwardhan, Newman PARAS, MD  QUEtiapine (SEROQUEL) 50 MG tablet Take 50 mg by mouth at bedtime. 03/08/22   [provider]  rosuvastatin  (CRESTOR ) 20 MG tablet Take 1 tablet (20 mg total) by mouth daily. 02/29/24 05/29/24  Elmira Newman PARAS, MD  SUMAtriptan (IMITREX) 100 MG tablet Take by mouth. 08/01/23   [provider]  verapamil (CALAN)  120 MG tablet Take 120 mg by mouth 3 (three) times daily. 12/20/23   [provider]  zolpidem (AMBIEN) 10 MG tablet Take 10 mg by mouth at bedtime.    [provider]    Family History Family History  Problem Relation Age of Onset   Colon cancer Sister        diagnosed at age <78 several years later metastasizd   Colon cancer Maternal Grandfather     Social History Social History[1]   Allergies   Patient has no known allergies.   Review of Systems Review of Systems PER HPI  Physical Exam Triage Vital Signs ED Triage Vitals  Encounter Vitals Group     BP 05/17/24 1827 (!) 144/72     Girls Systolic BP Percentile --      Girls Diastolic BP Percentile --      Boys Systolic BP Percentile --      Boys Diastolic BP Percentile --      Pulse Rate 05/17/24 1827 (!) 115     Resp 05/17/24 1827 18     Temp 05/17/24 1827 98 F (36.7 C)     Temp Source 05/17/24 1827 Oral     SpO2 05/17/24 1827 97 %     Weight --      Height --      Head Circumference --      Peak Flow --      Pain Score 05/17/24 1829 1     Pain Loc --      Pain Education --      Exclude from Growth Chart --    No data found.  Updated Vital Signs BP (!) 144/72 (BP Location: Right Arm)   Pulse (!) 115   Temp 98 F (36.7 C) (Oral)   Resp 18   SpO2 97%   Visual  Acuity Right Eye Distance:   Left Eye Distance:   Bilateral Distance:    Right Eye Near:   Left Eye Near:    Bilateral Near:     Physical Exam Vitals and nursing note reviewed.  Constitutional:      Appearance: Normal appearance.  HENT:     Head: Atraumatic.     Right Ear: Tympanic membrane and external ear normal.     Ears:     Comments: Left middle ear effusion    Nose: Rhinorrhea present.     Mouth/Throat:     Mouth: Mucous membranes are moist.     Pharynx: Oropharynx is clear. No posterior oropharyngeal erythema.  Eyes:     Extraocular Movements: Extraocular movements intact.     Conjunctiva/sclera: Conjunctivae normal.  Cardiovascular:     Rate and Rhythm: Normal rate and regular rhythm.  Pulmonary:     Effort: Pulmonary effort is normal.     Breath sounds: Normal breath sounds.  Musculoskeletal:        General: Normal range of motion.     Cervical back: Normal range of motion and neck supple.  Skin:    General: Skin is warm and dry.  Neurological:     General: No focal deficit present.     Mental Status: He is oriented to person, place, and time.  Psychiatric:        Mood and Affect: Mood normal.        Thought Content: Thought content normal.        Judgment: Judgment normal.      UC Treatments / Results  Labs (all labs ordered  are listed, but only abnormal results are displayed) Labs Reviewed - No data to display  EKG   Radiology No results found.  Procedures Procedures (including critical care time)  Medications Ordered in UC Medications - No data to display  Initial Impression / Assessment and Plan / UC Course  I have reviewed the triage vital signs and the nursing notes.  Pertinent labs & imaging results that were available during my care of the patient were reviewed by me and considered in my medical decision making (see chart for details).     Consistent with eustachian tube dysfunction of left ear likely secondary to seasonal  allergies.  Treat with antihistamines, short course of prednisone , Astelin , supportive over-the-counter medications and home care.  Return for worsening or unresolving symptoms.  Final Clinical Impressions(s) / UC Diagnoses   Final diagnoses:  Acute dysfunction of left eustachian tube  Seasonal allergic rhinitis due to other allergic trigger   Discharge Instructions   None    ED Prescriptions     Medication Sig Dispense Auth. Provider   predniSONE  (DELTASONE ) 20 MG tablet Take 2 tablets (40 mg total) by mouth daily with breakfast. 10 tablet Stuart Vernell Norris, PA-C   azelastine  (ASTELIN ) 0.1 % nasal spray Place 1 spray into both nostrils 2 (two) times daily. Use in each nostril as directed 30 mL Stuart Vernell Norris, PA-C      PDMP not reviewed this encounter.    [1]  Social History Tobacco Use   Smoking status: Every Day    Current packs/day: 1.00    Average packs/day: 1 pack/day for 38.0 years (38.0 ttl pk-yrs)    Types: Cigarettes   Smokeless tobacco: Never  Vaping Use   Vaping status: Never Used  Substance Use Topics   Alcohol use: No   Drug use: No     Stuart Vernell Norris, PA-C 05/18/24 1242

## 2024-05-22 ENCOUNTER — Ambulatory Visit: Admitting: Physician Assistant

## 2024-05-23 ENCOUNTER — Ambulatory Visit (HOSPITAL_COMMUNITY)

## 2024-05-23 DIAGNOSIS — F17210 Nicotine dependence, cigarettes, uncomplicated: Secondary | ICD-10-CM | POA: Diagnosis present

## 2024-05-23 DIAGNOSIS — Z87891 Personal history of nicotine dependence: Secondary | ICD-10-CM | POA: Diagnosis present

## 2024-05-23 DIAGNOSIS — Z122 Encounter for screening for malignant neoplasm of respiratory organs: Secondary | ICD-10-CM | POA: Insufficient documentation

## 2024-06-02 ENCOUNTER — Ambulatory Visit
Admission: RE | Admit: 2024-06-02 | Discharge: 2024-06-02 | Disposition: A | Discharge status: Home / Self Care | Payer: Self-pay | Source: Ambulatory Visit | Attending: Family Medicine | Admitting: Family Medicine

## 2024-06-02 VITALS — BP 113/67 | HR 115 | Temp 97.7°F | Resp 20

## 2024-06-02 DIAGNOSIS — H65192 Other acute nonsuppurative otitis media, left ear: Secondary | ICD-10-CM

## 2024-06-02 MED ORDER — PREDNISONE 50 MG PO TABS: ORAL_TABLET | ORAL | 0 refills | Status: AC

## 2024-06-02 NOTE — Discharge Instructions (Signed)
 Continue the nasal spray, over-the-counter decongestants, and I have sent in another round of steroids.  Follow-up for worsening symptoms

## 2024-06-02 NOTE — ED Triage Notes (Signed)
 Pt reports left ear pain, states he was seen on 05/17/2024, was given Astelin  and prednisone  found no relief with medications give her been using OTC meds and sweet oil.

## 2024-06-02 NOTE — ED Provider Notes (Signed)
 " [facility]    CSN: [id number] Arrival date & time: 06/02/24  1239      History   Chief Complaint Chief Complaint  Patient presents with   Ear Injury    Ear ache 3 days now no drainage just pain. Using Sweet Oil and over the counter pain meds. Neither has helped. - Entered by patient    HPI Terry Morrow is a 73 y.o. male.   Patient presenting today following up on visit from 05/17/2024 for left eustachian tube dysfunction given unresolving symptoms.  Was given prednisone , Astelin  and has been taking over-the-counter medications and sweet oil and states very minimal relief with this.  Denies worsening symptoms, fevers, chills, drainage, bleeding, loss of hearing.    Past Medical History:  Diagnosis Date   ASD (atrial septal defect)    GERD (gastroesophageal reflux disease)    Headache(784.0)    MIGRAINE  --  LAST ONE ACOUPLE DAYS AGO   History of kidney stones yrs ago   Hypertension    Insomnia    Mild emphysema (HCC)    Neuromuscular disorder (HCC)    neuropathy improved since off depakote   PFO (patent foramen ovale)    PFO (patent foramen ovale) Ostium secundum type atrial septal defect   PONV (postoperative nausea and vomiting)    Prostate disorder    enlarged   Shortness of breath    with over exertion    Patient Active Problem List   Diagnosis Date Noted   Mixed hyperlipidemia 02/21/2024   H/O: stroke 02/21/2024   ASD (atrial septal defect) 01/22/2024   Nicotine  dependence, cigarettes, uncomplicated 05/22/2023   Low back pain 04/14/2022   H/O adenomatous polyp of colon 08/24/2020   Constipation 08/24/2020   FH: colon cancer in first degree relative <37 years old 08/24/2020   Encounter for screening colonoscopy 06/23/2017   H/O failed conscious sedation 06/23/2017   COPD GOLD 0/  AB 01/23/2014   Migraines 01/23/2014   PFO (patent foramen ovale) 01/23/2014   GERD 01/05/2010   OTHER DYSPHAGIA 01/05/2010    Past Surgical History:   Procedure Laterality Date   BONE ANCHORED HEARING AID IMPLANT Left 11/15/2012   Procedure: LEFT BONE ANCHORED HEARING AID (BAHA) IMPLANT;  Surgeon: Ida Loader, MD;  Location: [facility];  Service: ENT;  Laterality: Left;   COLONOSCOPY N/A 02/28/2024   Procedure: COLONOSCOPY;  Surgeon: Shaaron Lamar HERO, MD;  Location: [facility];  Service: Endoscopy;  Laterality: N/A;  1:00 pm, asa 3, LM to see if pt can come earlier   COLONOSCOPY WITH PROPOFOL  N/A 07/20/2017   Dr. Shaaron: Diverticulosis, 6 polyps removed, multiple tubular adenomas.  Surveillance exam advised in 3 years.   COLONOSCOPY WITH PROPOFOL  N/A 10/12/2020   Procedure: COLONOSCOPY WITH PROPOFOL ;  Surgeon: Shaaron Lamar HERO, MD;  Location: [facility];  Service: Endoscopy;  Laterality: N/A;  pm appt   CYSTECTOMY     larynx   CYSTOSCOPY WITH INSERTION OF UROLIFT N/A 01/15/2019   Procedure: CYSTOSCOPY WITH INSERTION OF UROLIFT;  Surgeon: Matilda Senior, MD;  Location: [facility];  Service: Urology;  Laterality: N/A;   dental implant surgery  10/02/2020   ELBOW SURGERY Bilateral    x 2 on both   ESOPHAGOGASTRODUODENOSCOPY  2011   Dr. Shaaron: Empiric dilation for history of dysphagia, hiatal hernia   ESOPHAGOGASTRODUODENOSCOPY N/A 02/28/2024   Procedure: EGD (ESOPHAGOGASTRODUODENOSCOPY);  Surgeon: Shaaron Lamar HERO, MD;  Location: [facility];  Service: Endoscopy;  Laterality: N/A;  EYE SURGERY     BIL  CATARACTS   INNER EAR SURGERY     x 2 on left   KNEE SURGERY Bilateral    x 2 both knees arthroscopic   MASTOIDECTOMY     left   NECK SURGERY     x 5 stiff at times limited neck rom   POLYPECTOMY  07/20/2017   Procedure: POLYPECTOMY;  Surgeon: Shaaron Lamar HERO, MD;  Location: [facility];  Service: Endoscopy;;  Ileo-cecal valve (HS) x 1; Hepatic flexure(HS) x1; Descending colon(HS) x2   POLYPECTOMY  10/12/2020   Procedure: POLYPECTOMY;  Surgeon: Shaaron Lamar HERO, MD;  Location: [facility];  Service: Endoscopy;;   ROTATOR CUFF REPAIR Right     WRIST SURGERY     left       Home Medications    Prior to Admission medications  Medication Sig Start Date End Date Taking? Authorizing Provider  predniSONE  (DELTASONE ) 50 MG tablet Take 1 tab daily with breakfast for 5 days 06/02/24  Yes Stuart Vernell Norris, PA-C  albuterol  (PROVENTIL ) (2.5 MG/3ML) 0.083% nebulizer solution Take 2.5 mg by nebulization daily as needed for wheezing or shortness of breath. Shortness of breathe    [provider]  albuterol  (VENTOLIN  HFA) 108 (90 Base) MCG/ACT inhaler Inhale 2 puffs into the lungs every 6 (six) hours as needed for wheezing or shortness of breath.    [provider]  amitriptyline  (ELAVIL ) 100 MG tablet Take 100 mg by mouth at bedtime.    [provider]  aspirin  EC 81 MG tablet Take 1 tablet (81 mg total) by mouth daily. Swallow whole. 02/21/24   Patwardhan, Newman PARAS, MD  azelastine  (ASTELIN ) 0.1 % nasal spray Place 1 spray into both nostrils 2 (two) times daily. Use in each nostril as directed 05/17/24   Stuart Vernell Norris, PA-C  budeson-glycopyrrolate -formoterol (BREZTRI  AEROSPHERE) 160-9-4.8 MCG/ACT AERO Inhale 2 puffs into the lungs in the morning and at bedtime. 08/29/23   Darlean Ozell NOVAK, MD  buPROPion  ER (WELLBUTRIN  SR) 100 MG 12 hr tablet Take 1 tablet (100 mg total) by mouth 2 (two) times daily. For smoking cessation 03/04/24   Patwardhan, Newman PARAS, MD  gabapentin  (NEURONTIN ) 300 MG capsule Take 300 mg by mouth at bedtime. 02/17/22   [provider]  Glycerin-Hypromellose-PEG 400 (DRY EYE RELIEF DROPS) 0.2-0.2-1 % SOLN Place 1 drop into both eyes daily as needed (Dry eyes).    [provider]  HYDROcodone -acetaminophen  (NORCO) 10-325 MG tablet Take 1 tablet by mouth every 6 (six) hours as needed for severe pain or moderate pain.    [provider]  lansoprazole  (PREVACID ) 30 MG capsule Take 1 capsule (30 mg total) by mouth daily at 12 noon. 03/07/24   Rourk, Lamar HERO, MD   Multiple Vitamins-Minerals (ONE-A-DAY MENS 50+ PO) Take 1 tablet by mouth daily.    [provider]  nicotine  (NICODERM CQ  - DOSED IN MG/24 HOURS) 14 mg/24hr patch Place 1 patch (14 mg total) onto the skin daily. 02/21/24   Patwardhan, Newman PARAS, MD  predniSONE  (DELTASONE ) 20 MG tablet Take 2 tablets (40 mg total) by mouth daily with breakfast. 05/17/24   Stuart Vernell Norris, PA-C  QUEtiapine (SEROQUEL) 50 MG tablet Take 50 mg by mouth at bedtime. 03/08/22   [provider]  rosuvastatin  (CRESTOR ) 20 MG tablet Take 1 tablet (20 mg total) by mouth daily. 02/29/24 05/29/24  Elmira Newman PARAS, MD  SUMAtriptan (IMITREX) 100 MG tablet Take by mouth. 08/01/23  [provider]  verapamil (CALAN) 120 MG tablet Take 120 mg by mouth 3 (three) times daily. 12/20/23   [provider]  zolpidem (AMBIEN) 10 MG tablet Take 10 mg by mouth at bedtime.    [provider]    Family History Family History  Problem Relation Age of Onset   Colon cancer Sister        diagnosed at age <64 several years later metastasizd   Colon cancer Maternal Grandfather     Social History Social History[1]   Allergies   Patient has no known allergies.   Review of Systems Review of Systems PER HPI  Physical Exam Triage Vital Signs ED Triage Vitals  Encounter Vitals Group     BP 06/02/24 1316 113/67     Girls Systolic BP Percentile --      Girls Diastolic BP Percentile --      Boys Systolic BP Percentile --      Boys Diastolic BP Percentile --      Pulse Rate 06/02/24 1316 (!) 115     Resp 06/02/24 1316 20     Temp 06/02/24 1316 97.7 F (36.5 C)     Temp Source 06/02/24 1316 Oral     SpO2 06/02/24 1316 97 %     Weight --      Height --      Head Circumference --      Peak Flow --      Pain Score 06/02/24 1319 4     Pain Loc --      Pain Education --      Exclude from Growth Chart --    No data found.  Updated Vital Signs BP 113/67 (BP Location: Right  Arm)   Pulse (!) 115   Temp 97.7 F (36.5 C) (Oral)   Resp 20   SpO2 97%   Visual Acuity Right Eye Distance:   Left Eye Distance:   Bilateral Distance:    Right Eye Near:   Left Eye Near:    Bilateral Near:     Physical Exam Vitals and nursing note reviewed.  Constitutional:      Appearance: Normal appearance.  HENT:     Head: Atraumatic.     Right Ear: Tympanic membrane normal.     Ears:     Comments: Left middle ear effusion, no erythema, purulence    Nose: Nose normal.     Mouth/Throat:     Mouth: Mucous membranes are moist.  Eyes:     Extraocular Movements: Extraocular movements intact.     Conjunctiva/sclera: Conjunctivae normal.  Cardiovascular:     Rate and Rhythm: Normal rate.  Pulmonary:     Effort: Pulmonary effort is normal.  Musculoskeletal:        General: Normal range of motion.     Cervical back: Normal range of motion and neck supple.  Skin:    General: Skin is warm and dry.  Neurological:     Mental Status: He is oriented to person, place, and time.  Psychiatric:        Mood and Affect: Mood normal.        Thought Content: Thought content normal.        Judgment: Judgment normal.      UC Treatments / Results  Labs (all labs ordered are listed, but only abnormal results are displayed) Labs Reviewed - No data to display  EKG   Radiology No results found.  Procedures Procedures (including critical care  time)  Medications Ordered in UC Medications - No data to display  Initial Impression / Assessment and Plan / UC Course  I have reviewed the triage vital signs and the nursing notes.  Pertinent labs & imaging results that were available during my care of the patient were reviewed by me and considered in my medical decision making (see chart for details).     Will prescribe a higher dose of prednisone  in addition to continuing decongestants, Astelin , supportive measures over-the-counter.  Follow-up for worsening or unresolving  symptoms.  Patient agreeable to plan.  Final Clinical Impressions(s) / UC Diagnoses   Final diagnoses:  Acute MEE (middle ear effusion), left     Discharge Instructions      Continue the nasal spray, over-the-counter decongestants, and I have sent in another round of steroids.  Follow-up for worsening symptoms    ED Prescriptions     Medication Sig Dispense Auth. Provider   predniSONE  (DELTASONE ) 50 MG tablet Take 1 tab daily with breakfast for 5 days 5 tablet Stuart Vernell Norris, [place]      PDMP not reviewed this encounter.    [1]  Social History Tobacco Use   Smoking status: Every Day    Current packs/day: 1.00    Average packs/day: 1 pack/day for 38.0 years (38.0 ttl pk-yrs)    Types: Cigarettes   Smokeless tobacco: Never  Vaping Use   Vaping status: Never Used  Substance Use Topics   Alcohol use: No   Drug use: No     Stuart Vernell Norris, PA-C 06/02/24 1408  "

## 2024-06-03 ENCOUNTER — Other Ambulatory Visit: Payer: Self-pay

## 2024-06-03 DIAGNOSIS — F17210 Nicotine dependence, cigarettes, uncomplicated: Secondary | ICD-10-CM

## 2024-06-03 DIAGNOSIS — Z122 Encounter for screening for malignant neoplasm of respiratory organs: Secondary | ICD-10-CM

## 2024-06-03 DIAGNOSIS — Z87891 Personal history of nicotine dependence: Secondary | ICD-10-CM

## 2024-06-05 ENCOUNTER — Ambulatory Visit: Admitting: Orthopaedic Surgery

## 2024-07-01 ENCOUNTER — Ambulatory Visit: Admitting: Orthopaedic Surgery

## 2024-07-08 ENCOUNTER — Ambulatory Visit: Admitting: Internal Medicine

## 2024-07-08 DIAGNOSIS — J449 Chronic obstructive pulmonary disease, unspecified: Secondary | ICD-10-CM

## 2024-07-08 DIAGNOSIS — Q211 Atrial septal defect, unspecified: Secondary | ICD-10-CM

## 2024-07-29 ENCOUNTER — Ambulatory Visit: Admitting: Orthopaedic Surgery

## 2024-08-06 ENCOUNTER — Ambulatory Visit: Admitting: Internal Medicine
